# Patient Record
Sex: Male | Born: 1980 | Race: Black or African American | Hispanic: No | Marital: Single | Smoking: Current some day smoker
Health system: Southern US, Community
[De-identification: ages and names within clinical notes are randomized; demographics above are authoritative.]

## PROBLEM LIST (undated history)

## (undated) DIAGNOSIS — J45909 Unspecified asthma, uncomplicated: Secondary | ICD-10-CM

## (undated) DIAGNOSIS — R51 Headache: Secondary | ICD-10-CM

## (undated) DIAGNOSIS — H35039 Hypertensive retinopathy, unspecified eye: Secondary | ICD-10-CM

## (undated) DIAGNOSIS — H6120 Impacted cerumen, unspecified ear: Secondary | ICD-10-CM

## (undated) DIAGNOSIS — B3781 Candidal esophagitis: Secondary | ICD-10-CM

## (undated) DIAGNOSIS — H269 Unspecified cataract: Secondary | ICD-10-CM

## (undated) DIAGNOSIS — F419 Anxiety disorder, unspecified: Secondary | ICD-10-CM

## (undated) DIAGNOSIS — I82409 Acute embolism and thrombosis of unspecified deep veins of unspecified lower extremity: Secondary | ICD-10-CM

## (undated) DIAGNOSIS — H00019 Hordeolum externum unspecified eye, unspecified eyelid: Secondary | ICD-10-CM

## (undated) DIAGNOSIS — L989 Disorder of the skin and subcutaneous tissue, unspecified: Secondary | ICD-10-CM

## (undated) DIAGNOSIS — B20 Human immunodeficiency virus [HIV] disease: Secondary | ICD-10-CM

## (undated) DIAGNOSIS — H3320 Serous retinal detachment, unspecified eye: Secondary | ICD-10-CM

## (undated) DIAGNOSIS — R21 Rash and other nonspecific skin eruption: Secondary | ICD-10-CM

## (undated) DIAGNOSIS — K8309 Other cholangitis: Secondary | ICD-10-CM

## (undated) DIAGNOSIS — J189 Pneumonia, unspecified organism: Secondary | ICD-10-CM

## (undated) DIAGNOSIS — I10 Essential (primary) hypertension: Secondary | ICD-10-CM

## (undated) DIAGNOSIS — A5149 Other secondary syphilitic conditions: Secondary | ICD-10-CM

## (undated) DIAGNOSIS — Z21 Asymptomatic human immunodeficiency virus [HIV] infection status: Secondary | ICD-10-CM

## (undated) HISTORY — DX: Unspecified cataract: H26.9

## (undated) HISTORY — DX: Hypertensive retinopathy, unspecified eye: H35.039

## (undated) HISTORY — DX: Rash and other nonspecific skin eruption: R21

## (undated) HISTORY — DX: Impacted cerumen, unspecified ear: H61.20

## (undated) HISTORY — DX: Other secondary syphilitic conditions: A51.49

## (undated) HISTORY — DX: Serous retinal detachment, unspecified eye: H33.20

## (undated) MED FILL — Spironolactone Tab 100 MG: ORAL | Fill #0 | Status: CN

## (undated) MED FILL — Dolutegravir Sodium Tab 50 MG (Base Equiv): ORAL | Fill #0 | Status: CN

---
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
## 1999-08-06 ENCOUNTER — Emergency Department (HOSPITAL_COMMUNITY): Admission: EM | Admit: 1999-08-06 | Discharge: 1999-08-06 | Payer: Self-pay | Admitting: Emergency Medicine

## 2005-06-12 ENCOUNTER — Emergency Department (HOSPITAL_COMMUNITY): Admission: EM | Admit: 2005-06-12 | Discharge: 2005-06-13 | Payer: Self-pay | Admitting: Emergency Medicine

## 2005-06-14 ENCOUNTER — Emergency Department (HOSPITAL_COMMUNITY): Admission: EM | Admit: 2005-06-14 | Discharge: 2005-06-14 | Payer: Self-pay | Admitting: Emergency Medicine

## 2005-07-11 ENCOUNTER — Emergency Department (HOSPITAL_COMMUNITY): Admission: EM | Admit: 2005-07-11 | Discharge: 2005-07-11 | Payer: Self-pay | Admitting: Emergency Medicine

## 2005-09-11 ENCOUNTER — Emergency Department (HOSPITAL_COMMUNITY): Admission: EM | Admit: 2005-09-11 | Discharge: 2005-09-11 | Payer: Self-pay | Admitting: Emergency Medicine

## 2005-11-20 ENCOUNTER — Inpatient Hospital Stay (HOSPITAL_COMMUNITY): Admission: EM | Admit: 2005-11-20 | Discharge: 2005-11-24 | Payer: Self-pay | Admitting: Emergency Medicine

## 2005-11-21 ENCOUNTER — Ambulatory Visit: Payer: Self-pay | Admitting: Infectious Diseases

## 2005-12-06 ENCOUNTER — Ambulatory Visit: Payer: Self-pay | Admitting: Internal Medicine

## 2005-12-06 ENCOUNTER — Encounter: Admission: RE | Admit: 2005-12-06 | Discharge: 2005-12-06 | Payer: Self-pay | Admitting: Internal Medicine

## 2005-12-06 ENCOUNTER — Encounter (INDEPENDENT_AMBULATORY_CARE_PROVIDER_SITE_OTHER): Payer: Self-pay | Admitting: *Deleted

## 2005-12-06 LAB — CONVERTED CEMR LAB
CD4 Count: 9 microliters
CD4 T Cell Abs: 9

## 2005-12-20 ENCOUNTER — Ambulatory Visit: Payer: Self-pay | Admitting: Internal Medicine

## 2006-01-19 ENCOUNTER — Ambulatory Visit: Payer: Self-pay | Admitting: Internal Medicine

## 2006-02-03 ENCOUNTER — Ambulatory Visit: Payer: Self-pay | Admitting: Internal Medicine

## 2006-03-08 ENCOUNTER — Emergency Department (HOSPITAL_COMMUNITY): Admission: EM | Admit: 2006-03-08 | Discharge: 2006-03-09 | Payer: Self-pay | Admitting: Emergency Medicine

## 2006-03-21 ENCOUNTER — Emergency Department (HOSPITAL_COMMUNITY): Admission: EM | Admit: 2006-03-21 | Discharge: 2006-03-21 | Payer: Self-pay | Admitting: Family Medicine

## 2006-03-22 DIAGNOSIS — B59 Pneumocystosis: Secondary | ICD-10-CM | POA: Insufficient documentation

## 2006-03-22 DIAGNOSIS — J45909 Unspecified asthma, uncomplicated: Secondary | ICD-10-CM | POA: Insufficient documentation

## 2006-03-22 DIAGNOSIS — B20 Human immunodeficiency virus [HIV] disease: Secondary | ICD-10-CM | POA: Insufficient documentation

## 2006-03-29 ENCOUNTER — Encounter: Admission: RE | Admit: 2006-03-29 | Discharge: 2006-03-29 | Payer: Self-pay | Admitting: Internal Medicine

## 2006-03-29 ENCOUNTER — Encounter (INDEPENDENT_AMBULATORY_CARE_PROVIDER_SITE_OTHER): Payer: Self-pay | Admitting: *Deleted

## 2006-03-29 ENCOUNTER — Ambulatory Visit: Payer: Self-pay | Admitting: Internal Medicine

## 2006-03-29 ENCOUNTER — Inpatient Hospital Stay (HOSPITAL_COMMUNITY): Admission: AD | Admit: 2006-03-29 | Discharge: 2006-04-01 | Payer: Self-pay | Admitting: Internal Medicine

## 2006-03-29 LAB — CONVERTED CEMR LAB
CD4 Count: 80 microliters
HIV 1 RNA Quant: 49 copies/mL

## 2006-03-30 ENCOUNTER — Ambulatory Visit: Payer: Self-pay | Admitting: Infectious Diseases

## 2006-05-30 HISTORY — PX: INCISE AND DRAIN ABCESS: PRO64

## 2006-06-13 ENCOUNTER — Ambulatory Visit: Payer: Self-pay | Admitting: Internal Medicine

## 2006-06-27 ENCOUNTER — Ambulatory Visit: Payer: Self-pay | Admitting: Internal Medicine

## 2006-06-27 ENCOUNTER — Encounter (INDEPENDENT_AMBULATORY_CARE_PROVIDER_SITE_OTHER): Payer: Self-pay | Admitting: *Deleted

## 2006-06-27 ENCOUNTER — Encounter: Payer: Self-pay | Admitting: Infectious Diseases

## 2006-06-27 ENCOUNTER — Encounter: Admission: RE | Admit: 2006-06-27 | Discharge: 2006-06-27 | Payer: Self-pay | Admitting: Internal Medicine

## 2006-06-27 DIAGNOSIS — L0291 Cutaneous abscess, unspecified: Secondary | ICD-10-CM | POA: Insufficient documentation

## 2006-06-27 DIAGNOSIS — L0390 Cellulitis, unspecified: Secondary | ICD-10-CM

## 2006-06-27 LAB — CONVERTED CEMR LAB
ALT: 37 units/L (ref 0–53)
AST: 41 units/L — ABNORMAL HIGH (ref 0–37)
Albumin: 4 g/dL (ref 3.5–5.2)
BUN: 6 mg/dL (ref 6–23)
CO2: 25 meq/L (ref 19–32)
Calcium: 9.6 mg/dL (ref 8.4–10.5)
Chloride: 102 meq/L (ref 96–112)
Creatinine, Ser: 0.84 mg/dL (ref 0.40–1.50)
HIV 1 RNA Quant: <50 copies/mL (ref <50)
Hemoglobin: 13.9 g/dL (ref 13.0–17.0)
Lymphocytes Relative: 15 % (ref 12–46)
Monocytes Absolute: 0.6 10*3/uL (ref 0.2–0.7)
Monocytes Relative: 11 % (ref 3–11)
Neutro Abs: 3.5 10*3/uL (ref 1.7–7.7)
Neutrophils Relative %: 69 % (ref 43–77)
Potassium: 4.5 meq/L (ref 3.5–5.3)
RBC: 4.56 M/uL (ref 4.22–5.81)
WBC: 5.1 10*3/uL (ref 4.0–10.5)

## 2006-07-13 ENCOUNTER — Encounter: Payer: Self-pay | Admitting: Infectious Diseases

## 2006-07-24 ENCOUNTER — Encounter (INDEPENDENT_AMBULATORY_CARE_PROVIDER_SITE_OTHER): Payer: Self-pay | Admitting: *Deleted

## 2006-07-24 LAB — CONVERTED CEMR LAB

## 2006-08-06 ENCOUNTER — Encounter (INDEPENDENT_AMBULATORY_CARE_PROVIDER_SITE_OTHER): Payer: Self-pay | Admitting: *Deleted

## 2006-08-14 ENCOUNTER — Telehealth (INDEPENDENT_AMBULATORY_CARE_PROVIDER_SITE_OTHER): Payer: Self-pay | Admitting: Infectious Diseases

## 2006-09-03 ENCOUNTER — Emergency Department (HOSPITAL_COMMUNITY): Admission: EM | Admit: 2006-09-03 | Discharge: 2006-09-03 | Payer: Self-pay | Admitting: Emergency Medicine

## 2006-09-03 ENCOUNTER — Inpatient Hospital Stay (HOSPITAL_COMMUNITY): Admission: AD | Admit: 2006-09-03 | Discharge: 2006-09-11 | Payer: Self-pay | Admitting: Internal Medicine

## 2006-09-03 ENCOUNTER — Ambulatory Visit: Payer: Self-pay | Admitting: Internal Medicine

## 2006-09-04 ENCOUNTER — Ambulatory Visit: Payer: Self-pay | Admitting: Vascular Surgery

## 2006-09-04 ENCOUNTER — Encounter: Payer: Self-pay | Admitting: Vascular Surgery

## 2006-09-05 ENCOUNTER — Ambulatory Visit: Payer: Self-pay | Admitting: Internal Medicine

## 2006-09-11 ENCOUNTER — Encounter: Payer: Self-pay | Admitting: Internal Medicine

## 2006-09-24 ENCOUNTER — Emergency Department (HOSPITAL_COMMUNITY): Admission: EM | Admit: 2006-09-24 | Discharge: 2006-09-25 | Payer: Self-pay | Admitting: Emergency Medicine

## 2006-10-30 ENCOUNTER — Telehealth: Payer: Self-pay

## 2006-11-13 ENCOUNTER — Ambulatory Visit: Payer: Self-pay | Admitting: Internal Medicine

## 2006-11-13 ENCOUNTER — Telehealth: Payer: Self-pay | Admitting: Internal Medicine

## 2006-11-13 ENCOUNTER — Encounter: Admission: RE | Admit: 2006-11-13 | Discharge: 2006-11-13 | Payer: Self-pay | Admitting: Internal Medicine

## 2006-11-13 LAB — CONVERTED CEMR LAB
ALT: 33 units/L (ref 0–53)
AST: 32 units/L (ref 0–37)
Albumin: 4.2 g/dL (ref 3.5–5.2)
Alkaline Phosphatase: 170 units/L — ABNORMAL HIGH (ref 39–117)
Basophils Absolute: 0 10*3/uL (ref 0.0–0.1)
Eosinophils Absolute: 0.3 10*3/uL (ref 0.0–0.7)
Eosinophils Relative: 7 % — ABNORMAL HIGH (ref 0–5)
HCT: 46.2 % (ref 39.0–52.0)
HIV 1 RNA Quant: <50 copies/mL (ref <50)
Lymphs Abs: 1.1 10*3/uL (ref 0.7–3.3)
MCV: 96.5 fL (ref 78.0–100.0)
Neutrophils Relative %: 44 % (ref 43–77)
Platelets: 382 10*3/uL (ref 150–400)
Potassium: 4.4 meq/L (ref 3.5–5.3)
RDW: 12.5 % (ref 11.5–14.0)
Sodium: 140 meq/L (ref 135–145)
Total Bilirubin: 0.3 mg/dL (ref 0.3–1.2)
Total Protein: 8.6 g/dL — ABNORMAL HIGH (ref 6.0–8.3)
WBC: 3.4 10*3/uL — ABNORMAL LOW (ref 4.0–10.5)

## 2006-11-15 ENCOUNTER — Encounter: Payer: Self-pay | Admitting: Internal Medicine

## 2006-12-05 ENCOUNTER — Inpatient Hospital Stay (HOSPITAL_COMMUNITY): Admission: EM | Admit: 2006-12-05 | Discharge: 2006-12-08 | Payer: Self-pay | Admitting: Emergency Medicine

## 2006-12-05 ENCOUNTER — Ambulatory Visit: Payer: Self-pay | Admitting: Internal Medicine

## 2006-12-13 ENCOUNTER — Telehealth: Payer: Self-pay | Admitting: Internal Medicine

## 2007-01-01 ENCOUNTER — Ambulatory Visit (HOSPITAL_COMMUNITY): Admission: RE | Admit: 2007-01-01 | Discharge: 2007-01-01 | Payer: Self-pay | Admitting: Infectious Diseases

## 2007-01-01 ENCOUNTER — Encounter: Payer: Self-pay | Admitting: Internal Medicine

## 2007-06-25 ENCOUNTER — Telehealth: Payer: Self-pay | Admitting: Internal Medicine

## 2007-06-27 ENCOUNTER — Encounter: Admission: RE | Admit: 2007-06-27 | Discharge: 2007-06-27 | Payer: Self-pay | Admitting: Internal Medicine

## 2007-06-27 ENCOUNTER — Ambulatory Visit: Payer: Self-pay | Admitting: Internal Medicine

## 2007-06-27 LAB — CONVERTED CEMR LAB
Albumin: 3.8 g/dL (ref 3.5–5.2)
Alkaline Phosphatase: 101 units/L (ref 39–117)
BUN: 7 mg/dL (ref 6–23)
Calcium: 8.8 mg/dL (ref 8.4–10.5)
Creatinine, Ser: 0.84 mg/dL (ref 0.40–1.50)
Eosinophils Absolute: 0.1 10*3/uL (ref 0.0–0.7)
Glucose, Bld: 78 mg/dL (ref 70–99)
HCT: 46.3 % (ref 39.0–52.0)
HIV 1 RNA Quant: 99000 copies/mL — ABNORMAL HIGH (ref <50)
HIV-1 RNA Quant, Log: 5 — ABNORMAL HIGH (ref <1.70)
Hemoglobin: 15.1 g/dL (ref 13.0–17.0)
Lymphs Abs: 0.5 10*3/uL — ABNORMAL LOW (ref 0.7–4.0)
MCHC: 32.6 g/dL (ref 30.0–36.0)
MCV: 91 fL (ref 78.0–100.0)
Monocytes Absolute: 0.4 10*3/uL (ref 0.1–1.0)
Monocytes Relative: 17 % — ABNORMAL HIGH (ref 3–12)
Neutrophils Relative %: 60 % (ref 43–77)
Potassium: 3.8 meq/L (ref 3.5–5.3)
RBC: 5.09 M/uL (ref 4.22–5.81)
WBC: 2.4 10*3/uL — ABNORMAL LOW (ref 4.0–10.5)

## 2007-08-14 ENCOUNTER — Encounter: Payer: Self-pay | Admitting: Internal Medicine

## 2007-09-18 ENCOUNTER — Ambulatory Visit: Payer: Self-pay | Admitting: Internal Medicine

## 2007-11-12 ENCOUNTER — Telehealth: Payer: Self-pay | Admitting: Internal Medicine

## 2007-11-12 ENCOUNTER — Telehealth (INDEPENDENT_AMBULATORY_CARE_PROVIDER_SITE_OTHER): Payer: Self-pay | Admitting: *Deleted

## 2008-03-26 ENCOUNTER — Ambulatory Visit: Payer: Self-pay | Admitting: Internal Medicine

## 2008-03-26 LAB — CONVERTED CEMR LAB
ALT: 14 units/L (ref 0–53)
AST: 17 units/L (ref 0–37)
Albumin: 3.9 g/dL (ref 3.5–5.2)
Basophils Absolute: 0 10*3/uL (ref 0.0–0.1)
Basophils Relative: 1 % (ref 0–1)
Calcium: 9.4 mg/dL (ref 8.4–10.5)
Chloride: 110 meq/L (ref 96–112)
MCHC: 31.6 g/dL (ref 30.0–36.0)
Neutro Abs: 1.5 10*3/uL — ABNORMAL LOW (ref 1.7–7.7)
Neutrophils Relative %: 51 % (ref 43–77)
Potassium: 3.9 meq/L (ref 3.5–5.3)
RBC: 4.58 M/uL (ref 4.22–5.81)
RDW: 13.9 % (ref 11.5–15.5)
Total Protein: 7.5 g/dL (ref 6.0–8.3)

## 2008-04-09 ENCOUNTER — Ambulatory Visit: Payer: Self-pay | Admitting: Internal Medicine

## 2008-04-09 DIAGNOSIS — G609 Hereditary and idiopathic neuropathy, unspecified: Secondary | ICD-10-CM | POA: Insufficient documentation

## 2008-06-11 ENCOUNTER — Ambulatory Visit: Payer: Self-pay | Admitting: Internal Medicine

## 2008-06-11 LAB — CONVERTED CEMR LAB
AST: 27 units/L (ref 0–37)
Albumin: 4 g/dL (ref 3.5–5.2)
Alkaline Phosphatase: 74 units/L (ref 39–117)
BUN: 9 mg/dL (ref 6–23)
Basophils Relative: 1 % (ref 0–1)
Eosinophils Absolute: 0 10*3/uL (ref 0.0–0.7)
MCHC: 31.9 g/dL (ref 30.0–36.0)
MCV: 95.2 fL (ref 78.0–100.0)
Monocytes Relative: 23 % — ABNORMAL HIGH (ref 3–12)
Neutrophils Relative %: 56 % (ref 43–77)
Platelets: 297 10*3/uL (ref 150–400)
Potassium: 3.9 meq/L (ref 3.5–5.3)
RBC: 5.04 M/uL (ref 4.22–5.81)
RDW: 11.5 % (ref 11.5–15.5)

## 2008-07-05 ENCOUNTER — Emergency Department (HOSPITAL_COMMUNITY): Admission: EM | Admit: 2008-07-05 | Discharge: 2008-07-05 | Payer: Self-pay | Admitting: Emergency Medicine

## 2008-07-09 ENCOUNTER — Ambulatory Visit: Payer: Self-pay | Admitting: Internal Medicine

## 2008-09-03 ENCOUNTER — Telehealth (INDEPENDENT_AMBULATORY_CARE_PROVIDER_SITE_OTHER): Payer: Self-pay | Admitting: *Deleted

## 2008-09-17 ENCOUNTER — Ambulatory Visit: Payer: Self-pay | Admitting: Internal Medicine

## 2008-09-17 ENCOUNTER — Ambulatory Visit (HOSPITAL_COMMUNITY): Admission: RE | Admit: 2008-09-17 | Discharge: 2008-09-17 | Payer: Self-pay | Admitting: Internal Medicine

## 2008-09-17 DIAGNOSIS — K029 Dental caries, unspecified: Secondary | ICD-10-CM | POA: Insufficient documentation

## 2008-09-17 DIAGNOSIS — R0789 Other chest pain: Secondary | ICD-10-CM | POA: Insufficient documentation

## 2008-11-10 ENCOUNTER — Encounter: Payer: Self-pay | Admitting: Internal Medicine

## 2008-12-02 ENCOUNTER — Telehealth: Payer: Self-pay

## 2009-02-26 ENCOUNTER — Telehealth (INDEPENDENT_AMBULATORY_CARE_PROVIDER_SITE_OTHER): Payer: Self-pay | Admitting: *Deleted

## 2009-02-26 ENCOUNTER — Encounter: Payer: Self-pay | Admitting: Internal Medicine

## 2009-02-26 ENCOUNTER — Emergency Department (HOSPITAL_COMMUNITY): Admission: EM | Admit: 2009-02-26 | Discharge: 2009-02-26 | Payer: Self-pay | Admitting: Emergency Medicine

## 2009-03-30 HISTORY — PX: AMPUTATION FINGER / THUMB: SUR24

## 2009-03-30 HISTORY — PX: INCISION AND DRAINAGE OF WOUND: SHX1803

## 2009-04-05 ENCOUNTER — Inpatient Hospital Stay (HOSPITAL_COMMUNITY): Admission: EM | Admit: 2009-04-05 | Discharge: 2009-04-09 | Payer: Self-pay | Admitting: Emergency Medicine

## 2009-04-06 ENCOUNTER — Ambulatory Visit: Payer: Self-pay | Admitting: Infectious Diseases

## 2009-04-06 ENCOUNTER — Encounter (INDEPENDENT_AMBULATORY_CARE_PROVIDER_SITE_OTHER): Payer: Self-pay

## 2009-04-09 ENCOUNTER — Ambulatory Visit: Payer: Self-pay | Admitting: Infectious Diseases

## 2009-04-13 ENCOUNTER — Encounter: Payer: Self-pay | Admitting: Internal Medicine

## 2009-04-20 ENCOUNTER — Encounter: Payer: Self-pay | Admitting: Internal Medicine

## 2009-05-01 ENCOUNTER — Encounter: Payer: Self-pay | Admitting: Internal Medicine

## 2009-05-05 ENCOUNTER — Telehealth (INDEPENDENT_AMBULATORY_CARE_PROVIDER_SITE_OTHER): Payer: Self-pay | Admitting: *Deleted

## 2009-05-07 ENCOUNTER — Telehealth: Payer: Self-pay | Admitting: Infectious Diseases

## 2009-05-07 ENCOUNTER — Encounter: Payer: Self-pay | Admitting: Infectious Diseases

## 2009-05-13 ENCOUNTER — Encounter: Payer: Self-pay | Admitting: Internal Medicine

## 2009-06-04 ENCOUNTER — Telehealth (INDEPENDENT_AMBULATORY_CARE_PROVIDER_SITE_OTHER): Payer: Self-pay | Admitting: *Deleted

## 2009-06-04 ENCOUNTER — Encounter: Payer: Self-pay | Admitting: Infectious Diseases

## 2009-06-11 ENCOUNTER — Telehealth (INDEPENDENT_AMBULATORY_CARE_PROVIDER_SITE_OTHER): Payer: Self-pay | Admitting: *Deleted

## 2009-11-12 ENCOUNTER — Ambulatory Visit: Payer: Self-pay | Admitting: Infectious Diseases

## 2009-11-12 ENCOUNTER — Encounter: Payer: Self-pay | Admitting: Internal Medicine

## 2009-11-12 ENCOUNTER — Inpatient Hospital Stay (HOSPITAL_COMMUNITY): Admission: EM | Admit: 2009-11-12 | Discharge: 2009-11-16 | Payer: Self-pay | Admitting: Emergency Medicine

## 2009-11-16 ENCOUNTER — Encounter: Payer: Self-pay | Admitting: Internal Medicine

## 2009-11-16 DIAGNOSIS — B009 Herpesviral infection, unspecified: Secondary | ICD-10-CM | POA: Insufficient documentation

## 2009-11-18 ENCOUNTER — Encounter: Payer: Self-pay | Admitting: Internal Medicine

## 2009-11-19 ENCOUNTER — Telehealth: Payer: Self-pay | Admitting: Internal Medicine

## 2010-02-09 ENCOUNTER — Telehealth (INDEPENDENT_AMBULATORY_CARE_PROVIDER_SITE_OTHER): Payer: Self-pay | Admitting: *Deleted

## 2010-02-17 ENCOUNTER — Encounter (INDEPENDENT_AMBULATORY_CARE_PROVIDER_SITE_OTHER): Payer: Self-pay | Admitting: *Deleted

## 2010-03-05 ENCOUNTER — Encounter (INDEPENDENT_AMBULATORY_CARE_PROVIDER_SITE_OTHER): Payer: Self-pay | Admitting: *Deleted

## 2010-03-08 ENCOUNTER — Encounter: Payer: Self-pay | Admitting: Adult Health

## 2010-03-08 ENCOUNTER — Ambulatory Visit: Payer: Self-pay | Admitting: Internal Medicine

## 2010-03-08 ENCOUNTER — Encounter (INDEPENDENT_AMBULATORY_CARE_PROVIDER_SITE_OTHER): Payer: Self-pay | Admitting: *Deleted

## 2010-03-08 DIAGNOSIS — R05 Cough: Secondary | ICD-10-CM

## 2010-03-08 DIAGNOSIS — R059 Cough, unspecified: Secondary | ICD-10-CM | POA: Insufficient documentation

## 2010-03-08 DIAGNOSIS — B3781 Candidal esophagitis: Secondary | ICD-10-CM | POA: Insufficient documentation

## 2010-03-08 DIAGNOSIS — R61 Generalized hyperhidrosis: Secondary | ICD-10-CM | POA: Insufficient documentation

## 2010-03-08 DIAGNOSIS — H538 Other visual disturbances: Secondary | ICD-10-CM | POA: Insufficient documentation

## 2010-03-08 DIAGNOSIS — R64 Cachexia: Secondary | ICD-10-CM | POA: Insufficient documentation

## 2010-03-08 LAB — CONVERTED CEMR LAB
Alkaline Phosphatase: 73 units/L (ref 39–117)
BUN: 7 mg/dL (ref 6–23)
Creatinine, Ser: 1.12 mg/dL (ref 0.40–1.50)
Eosinophils Absolute: 0 10*3/uL (ref 0.0–0.7)
Eosinophils Relative: 0 % (ref 0–5)
Glucose, Bld: 81 mg/dL (ref 70–99)
HCT: 41 % (ref 39.0–52.0)
HDL: 28 mg/dL — ABNORMAL LOW (ref >39)
LDL Cholesterol: 22 mg/dL (ref 0–99)
Lymphs Abs: 0.8 10*3/uL (ref 0.7–4.0)
MCV: 94.9 fL (ref 78.0–100.0)
Monocytes Absolute: 0.5 10*3/uL (ref 0.1–1.0)
Monocytes Relative: 11 % (ref 3–12)
Platelets: 224 10*3/uL (ref 150–400)
RBC: 4.32 M/uL (ref 4.22–5.81)
Total Bilirubin: 0.7 mg/dL (ref 0.3–1.2)
Triglycerides: 75 mg/dL (ref <150)
VLDL: 15 mg/dL (ref 0–40)
WBC: 4.8 10*3/uL (ref 4.0–10.5)

## 2010-03-10 ENCOUNTER — Encounter (INDEPENDENT_AMBULATORY_CARE_PROVIDER_SITE_OTHER): Payer: Self-pay | Admitting: *Deleted

## 2010-03-12 ENCOUNTER — Encounter (INDEPENDENT_AMBULATORY_CARE_PROVIDER_SITE_OTHER): Payer: Self-pay | Admitting: *Deleted

## 2010-03-18 ENCOUNTER — Ambulatory Visit: Payer: Self-pay | Admitting: Adult Health

## 2010-03-18 ENCOUNTER — Encounter (INDEPENDENT_AMBULATORY_CARE_PROVIDER_SITE_OTHER): Payer: Self-pay | Admitting: *Deleted

## 2010-03-18 DIAGNOSIS — E876 Hypokalemia: Secondary | ICD-10-CM | POA: Insufficient documentation

## 2010-03-26 ENCOUNTER — Encounter (INDEPENDENT_AMBULATORY_CARE_PROVIDER_SITE_OTHER): Payer: Self-pay | Admitting: *Deleted

## 2010-04-16 ENCOUNTER — Encounter (INDEPENDENT_AMBULATORY_CARE_PROVIDER_SITE_OTHER): Payer: Self-pay | Admitting: *Deleted

## 2010-04-19 ENCOUNTER — Ambulatory Visit: Payer: Self-pay | Admitting: Adult Health

## 2010-04-19 ENCOUNTER — Encounter (INDEPENDENT_AMBULATORY_CARE_PROVIDER_SITE_OTHER): Payer: Self-pay | Admitting: *Deleted

## 2010-04-19 LAB — CONVERTED CEMR LAB
Albumin: 4.2 g/dL (ref 3.5–5.2)
Alkaline Phosphatase: 85 units/L (ref 39–117)
BUN: 12 mg/dL (ref 6–23)
Basophils Relative: 0 % (ref 0–1)
CO2: 22 meq/L (ref 19–32)
Glucose, Bld: 97 mg/dL (ref 70–99)
HIV-1 RNA Quant, Log: 2.94 — ABNORMAL HIGH (ref <1.30)
Hemoglobin: 14.5 g/dL (ref 13.0–17.0)
Lymphs Abs: 0.7 10*3/uL (ref 0.7–4.0)
MCHC: 32.8 g/dL (ref 30.0–36.0)
Monocytes Absolute: 0.5 10*3/uL (ref 0.1–1.0)
Monocytes Relative: 16 % — ABNORMAL HIGH (ref 3–12)
Neutro Abs: 1.9 10*3/uL (ref 1.7–7.7)
RBC: 4.67 M/uL (ref 4.22–5.81)
Total Bilirubin: 0.3 mg/dL (ref 0.3–1.2)
WBC: 3.1 10*3/uL — ABNORMAL LOW (ref 4.0–10.5)

## 2010-05-06 ENCOUNTER — Ambulatory Visit: Payer: Self-pay | Admitting: Adult Health

## 2010-05-06 ENCOUNTER — Encounter (INDEPENDENT_AMBULATORY_CARE_PROVIDER_SITE_OTHER): Payer: Self-pay | Admitting: *Deleted

## 2010-05-10 ENCOUNTER — Encounter: Payer: Self-pay | Admitting: Adult Health

## 2010-05-10 ENCOUNTER — Encounter (INDEPENDENT_AMBULATORY_CARE_PROVIDER_SITE_OTHER): Payer: Self-pay | Admitting: *Deleted

## 2010-05-10 DIAGNOSIS — G4700 Insomnia, unspecified: Secondary | ICD-10-CM | POA: Insufficient documentation

## 2010-05-10 DIAGNOSIS — K056 Periodontal disease, unspecified: Secondary | ICD-10-CM | POA: Insufficient documentation

## 2010-05-10 DIAGNOSIS — K069 Disorder of gingiva and edentulous alveolar ridge, unspecified: Secondary | ICD-10-CM

## 2010-05-27 ENCOUNTER — Emergency Department (HOSPITAL_COMMUNITY)
Admission: EM | Admit: 2010-05-27 | Discharge: 2010-05-27 | Payer: Self-pay | Source: Home / Self Care | Admitting: Emergency Medicine

## 2010-05-31 ENCOUNTER — Emergency Department (HOSPITAL_COMMUNITY)
Admission: EM | Admit: 2010-05-31 | Discharge: 2010-05-31 | Payer: Self-pay | Source: Home / Self Care | Admitting: Emergency Medicine

## 2010-06-08 ENCOUNTER — Encounter (INDEPENDENT_AMBULATORY_CARE_PROVIDER_SITE_OTHER): Payer: Self-pay | Admitting: *Deleted

## 2010-06-10 ENCOUNTER — Encounter: Payer: Self-pay | Admitting: Adult Health

## 2010-06-10 ENCOUNTER — Telehealth: Payer: Self-pay | Admitting: Adult Health

## 2010-06-10 ENCOUNTER — Ambulatory Visit
Admission: RE | Admit: 2010-06-10 | Discharge: 2010-06-10 | Payer: Self-pay | Source: Home / Self Care | Attending: Adult Health | Admitting: Adult Health

## 2010-06-10 ENCOUNTER — Encounter (INDEPENDENT_AMBULATORY_CARE_PROVIDER_SITE_OTHER): Payer: Self-pay | Admitting: *Deleted

## 2010-06-10 LAB — CONVERTED CEMR LAB
Albumin: 4 g/dL (ref 3.5–5.2)
Alkaline Phosphatase: 106 units/L (ref 39–117)
BUN: 8 mg/dL (ref 6–23)
CO2: 24 meq/L (ref 19–32)
Calcium: 9.3 mg/dL (ref 8.4–10.5)
Glucose, Bld: 81 mg/dL (ref 70–99)
Hemoglobin: 15.7 g/dL (ref 13.0–17.0)
Lymphocytes Relative: 46 % (ref 12–46)
Lymphs Abs: 1.6 10*3/uL (ref 0.7–4.0)
MCHC: 33.1 g/dL (ref 30.0–36.0)
Monocytes Absolute: 0.4 10*3/uL (ref 0.1–1.0)
Monocytes Relative: 11 % (ref 3–12)
Neutro Abs: 1.4 10*3/uL — ABNORMAL LOW (ref 1.7–7.7)
Potassium: 4.1 meq/L (ref 3.5–5.3)
RBC: 4.93 M/uL (ref 4.22–5.81)
WBC: 3.6 10*3/uL — ABNORMAL LOW (ref 4.0–10.5)

## 2010-06-14 LAB — T-HELPER CELL (CD4) - (RCID CLINIC ONLY)
CD4 % Helper T Cell: 2 % — ABNORMAL LOW (ref 33–55)
CD4 T Cell Abs: 20 uL — ABNORMAL LOW (ref 400–2700)

## 2010-06-20 ENCOUNTER — Encounter: Payer: Self-pay | Admitting: Infectious Diseases

## 2010-06-20 ENCOUNTER — Encounter: Payer: Self-pay | Admitting: *Deleted

## 2010-06-24 ENCOUNTER — Encounter (INDEPENDENT_AMBULATORY_CARE_PROVIDER_SITE_OTHER): Payer: Self-pay | Admitting: *Deleted

## 2010-06-24 ENCOUNTER — Ambulatory Visit
Admission: RE | Admit: 2010-06-24 | Discharge: 2010-06-24 | Payer: Self-pay | Source: Home / Self Care | Attending: Adult Health | Admitting: Adult Health

## 2010-06-24 DIAGNOSIS — B081 Molluscum contagiosum: Secondary | ICD-10-CM | POA: Insufficient documentation

## 2010-06-28 ENCOUNTER — Encounter (INDEPENDENT_AMBULATORY_CARE_PROVIDER_SITE_OTHER): Payer: Self-pay | Admitting: *Deleted

## 2010-06-29 NOTE — Discharge Summary (Signed)
Summary: Hospital Discharge Update (thrush, CMV)    Hospital Discharge Update:  Date of Admission: 11/12/2009 Date of Discharge: 11/16/2009  Brief Summary:  abd pain- scans and test negaitve              - candidal esopahgitis + ? 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) - Signed  Medication list changes:  Added new medication of FLUCONAZOLE 150 MG TABS (FLUCONAZOLE) Take 1 tablet by mouth once a day for 15 days - Signed Added new medication of GANCICLOVIR 500 MG CAPS (GANCICLOVIR) 2 tablets three times a day for 15 days - Signed Added new medication of NYSTATIN 100000 UNIT/ML SUSP (NYSTATIN) swallow 5 ml three times a day for 15 days - Signed Added new medication of TRAMADOL HCL 50 MG TABS (TRAMADOL HCL) every 4 hrs as needed for pain - Signed Rx of FLUCONAZOLE 150 MG TABS (FLUCONAZOLE) Take 1 tablet by mouth once a day for 15 days;  #15 x 0;  Signed;  Entered by: Bethel Born MD;  Authorized by: Bethel Born MD;  Method used: [facility] [phone]*, [address] [place], [place]  [address], Ph: [phone] or [phone], Fax: [phone] Rx of GANCICLOVIR 500 MG CAPS (GANCICLOVIR) 2 tablets three times a day for 15 days;  #90 x 0;  Signed;  Entered by: Bethel Born MD;  Authorized by: Bethel Born MD;  Method used: [facility]. [phone]*, [address], [place], [place]  [address], Ph: [phone] or [phone], Fax: [phone] Rx of NYSTATIN 100000 UNIT/ML SUSP (NYSTATIN) swallow 5 ml three times a day for 15 days;  #1 x 1;  Signed;  Entered by:  Bethel Born MD;  Authorized by: Bethel Born MD;  Method used: [facility] to [facility]. [phone]*, [address] [place], [place]  [address], Ph: [phone] or [phone], Fax: [phone] Rx of TRAMADOL HCL 50 MG TABS (TRAMADOL HCL) every 4 hrs as needed for pain;  #120 x 0;  Signed;  Entered by: Bethel Born MD;  Authorized by: Bethel Born MD;  Method used: [facility] to [facility] [phone]*, [address], [place], [place]  [address], Ph: [phone] or [phone], Fax: [phone] Rx of FLUCONAZOLE 150 MG TABS (FLUCONAZOLE) Take 1 tablet by mouth once a day for 15 days;  #15 x 0;  Signed;  Entered by: Bethel Born MD;  Authorized by: Bethel Born MD;  Method used: Electronically to [facility] [address] [address], [place], [place]  [id number], Ph: [phone], Fax: [phone] Rx of GANCICLOVIR 500 MG CAPS (GANCICLOVIR) 2 tablets three times a day for 15 days;  #90 x 0;  Signed;  Entered by: Bethel Born MD;  Authorized by: Bethel Born MD;  Method used: Electronically to [facility] [facility] [address], [place], [place]  [id number], Ph: [phone], Fax: [phone] Rx of NYSTATIN 100000 UNIT/ML SUSP (NYSTATIN) swallow 5 ml three times a day for 15 days;  #1 x 1;  Signed;  Entered by: Bethel Born MD;  Authorized by: Bethel Born MD;  Method used:  Electronically to [facility] [address], [place], [place]  [id number], Ph: [phone], Fax: [phone] Rx of TRAMADOL HCL 50 MG TABS (TRAMADOL HCL) every 4 hrs as needed for pain;  #120 x 0;  Signed;  Entered by: Bethel Born MD;  Authorized by: Bethel Born MD;  Method used: Electronically to [facility] [address], [place], [place]  [address], Ph: [phone], Fax: [phone]  The medication, problem, and allergy lists have been updated.  Please see the dictated discharge summary for details.  Discharge medications:  BACTRIM  DS 800-160 MG TABS (SULFAMETHOXAZOLE-TRIMETHOPRIM) one tab by mouth once daily AZITHROMYCIN 600 MG TABS (AZITHROMYCIN) take 2 tablets once a week TRUVADA 200-300 MG TABS (EMTRICITABINE-TENOFOVIR) Take 1 tablet by mouth once a day PREZISTA 400 MG TABS (DARUNAVIR ETHANOLATE) Take 2 tablets by mouth once a day * NORVIR 100 MG TABS (RITONAVIR) Take 1 tablet by mouth once a day FLUCONAZOLE 150 MG TABS (FLUCONAZOLE) Take 1 tablet by mouth once a day for 15 days GANCICLOVIR 500 MG CAPS (GANCICLOVIR) 2 tablets three times a day for 15 days NYSTATIN 100000 UNIT/ML SUSP (NYSTATIN) swallow 5 ml three times a day for 15 days TRAMADOL HCL 50 MG TABS (TRAMADOL HCL) every 4 hrs as needed for pain  Other patient instructions:  Follow up with Dr. Drue Second in 1-2 week    Note: Hospital Discharge Medications & Other Instructions handout was printed, one copy for patient and a second copy to be placed in hospital chart.  Prescriptions: TRAMADOL HCL 50 MG TABS (TRAMADOL HCL) every 4 hrs as needed for pain  #120 x 0   Entered and Authorized by:   Bethel Born MD   Signed by:   Bethel Born MD on 11/16/2009   Method used:   Electronically to        [facility] (retail)       [address]       [place], [place]  [address]       Ph: [phone]       Fax: [phone]   RxID:   [id number] NYSTATIN 100000 UNIT/ML SUSP (NYSTATIN) swallow 5 ml three times a day for 15 days  #1 x 1   Entered and Authorized by:   Bethel Born MD   Signed by:   Bethel Born MD on 11/16/2009   Method used:   Electronically to        [facility] [address] (retail)       [address]       [place], [place]  [address]       Ph: [phone]       Fax: [phone]   RxID:   [id number] GANCICLOVIR 500 MG CAPS (GANCICLOVIR) 2 tablets three times a day for 15 days  #90 x 0   Entered and Authorized by:   Bethel Born MD   Signed by:   Bethel Born MD on 11/16/2009   Method  used:   Electronically to        [facility] (retail)       [address]       [place], [place]  [address]       Ph: [phone]       Fax: [phone]   RxID:   [id number] FLUCONAZOLE 150 MG TABS (FLUCONAZOLE) Take 1 tablet by mouth once a day for 15 days  #15 x 0   Entered and Authorized  by:   Bethel Born MD   Signed by:   Bethel Born MD on 11/16/2009   Method used:   Electronically to        [facility] (retail)       [address]       [place], [place]  [address]       Ph: [phone]       Fax: [phone]   RxID:   [id number] TRAMADOL HCL 50 MG TABS (TRAMADOL HCL) every 4 hrs as needed for pain  #120 x 0   Entered and Authorized by:   Bethel Born MD   Signed by:   Bethel Born MD on 11/16/2009   Method used:   [facility]        [facility] [phone]* (retail)       [address], [place]  [address]       Ph: [phone] or [phone]       Fax: [phone]   RxID:   [id number] NYSTATIN 100000 UNIT/ML SUSP (NYSTATIN) swallow 5 ml three times a day for 15 days  #1 x 1   Entered and Authorized by:   Bethel Born MD   Signed by:   Bethel Born MD on 11/16/2009   Method used:   [facility]        [facility] [phone]* (retail)       [address], [place]  [address]       Ph: [phone] or [phone]       Fax: [phone]   RxID:   [id number] GANCICLOVIR 500 MG CAPS (GANCICLOVIR) 2 tablets three times a day for 15 days  #90 x 0   Entered and Authorized by:   Bethel Born MD   Signed by:   Bethel Born MD on 11/16/2009   Method used:   [facility]        [facility] [phone]* (retail)       [address], [place]  [address]       Ph: [phone] or [phone]       Fax: [phone]   RxID:   [id number] FLUCONAZOLE 150 MG TABS (FLUCONAZOLE) Take 1 tablet by mouth once a day for 15 days  #15 x 0   Entered and  Authorized by:   Bethel Born MD   Signed by:   Bethel Born MD on 11/16/2009   Method used:   [facility] to        [facility]. [phone]* (retail)       [address]       [place], [place]  [address]       Ph: [phone] or [phone]       Fax: [phone]   RxID:   [id number]  please ignore duplicate prescriptions.

## 2010-06-29 NOTE — Progress Notes (Signed)
Summary: [facility] ID clinic  Phone Note Other Incoming   Caller: Clavin at [facility] Summary of Call: Received a call from Clavin at [facility] pt. has missed several appts. and wanted the name and number for one of the [employer]. Called back at [phone] and left name and number for [facility]. Initial call taken by: Wendall Mola CMA [facility]),  February 09, 2010 11:08 AM

## 2010-06-29 NOTE — Miscellaneous (Signed)
Summary: Bridge Counselor  Clinical Lists Changes BC rescheuled pt's eye appointment today for December 9th. Pt did not have her medicaid card and was unable to be seen. BC will coordinate pt getting another copy of her medicaid card prior to her next appointment.

## 2010-06-29 NOTE — Miscellaneous (Signed)
Summary: Bridge Counselor  Clinical Lists Changes BC transported pt to [facility]. They would not see her today because she did not have her ID. She was rescheduled for 03/26/10 at 1:15pm. [facility] will provide transportation. BC also transported pt to [facility] for her f/u visit with Traci Sermon. Pt's thrush is gone and she reports that she is eating better. Pt's HIV medications were called into [facility]. Once pt receives her medications she will call BC. BC will make a home visit to assist pt with Tx adherence and filling up her pillbox.

## 2010-06-29 NOTE — Miscellaneous (Signed)
Summary: Bridge Counselor  Clinical Lists Changes BC transported pt to [facility] today for her scheuled appointment. Pt was told that there was nothing wrong with her eyes and that her eye sight will get worse with age. (pt report) BC rescheuled pt's [facility] appointment for 05/10/10 at 2:45pm due to being at the eye doctor for so long. Pt also now has a new Medicaid card and valid ID.

## 2010-06-29 NOTE — Miscellaneous (Signed)
Summary: Bridge Counselor  Clinical Lists Changes BC made a home visit this date to provide treatment adherence. BC took pt a chart showing pictures of each medication, when to take them and how to take them. BC also gave pt a checklist to mark each day that she takes her medications. BC also gave pt a journal to doument questions and side effects. Pt will return on 11/21 for repeat labs. <no value>

## 2010-06-29 NOTE — Assessment & Plan Note (Signed)
Summary: 10DAY F/U/VS   Visit Type:  Follow-up Primary Provider:  Talmadge Chad NP  CC:  f/u .  History of Present Illness: Presents for f/u following treatment of oral candidiasis.  Ophtho appt. rescheduled due to no ID on person.  No changes in vision reported.  Dysphagia and odynohagia abated.  Appetite improving.  No further chills, but remains with sweats.  Did not p/u Ensure as prescribed.  Still having issues with depression.  No suicidal thoughts or plans.   Preventive Screening-Counseling & Management  Alcohol-Tobacco     Alcohol drinks/day: 4 week     Alcohol type: beer     Smoking Status: current     Packs/Day: 0.5     Cigars/week: 2   Current Allergies (reviewed today): ! * PNEUMOVAX Review of Systems General:  Complains of fatigue, malaise, sweats, weakness, and weight loss; denies chills, fever, loss of appetite, and sleep disorder. Eyes:  Denies discharge, double vision, eye irritation, eye pain, halos, itching, light sensitivity, red eye, vision loss-1 eye, and vision loss-both eyes; Blurring unchanged no worse, no better. ENT:  Denies difficulty swallowing, hoarseness, and sore throat. Resp:  Denies chest discomfort, chest pain with inspiration, cough, coughing up blood, excessive snoring, hypersomnolence, morning headaches, pleuritic, shortness of breath, sputum productive, and wheezing. GI:  Denies abdominal pain, bloody stools, change in bowel habits, constipation, dark tarry stools, diarrhea, excessive appetite, gas, hemorrhoids, indigestion, loss of appetite, nausea, vomiting, vomiting blood, and yellowish skin color. Psych:  Complains of depression; denies alternate hallucination ( auditory/visual), anxiety, easily angered, easily tearful, irritability, mental problems, panic attacks, sense of great danger, suicidal thoughts/plans, thoughts of violence, unusual visions or sounds, and thoughts /plans of harming others.  Vital Signs:  Patient profile:   30  year old male Height:      71 inches (180.34 cm) Weight:      126.75 pounds (57.61 kg) BMI:     17.74 Temp:     97.4 degrees F (36.33 degrees C) oral Pulse rate:   74 / minute BP sitting:   121 / 81  (left arm)  Vitals Entered By: Starleen Arms CMA (March 18, 2010 2:26 PM) CC: f/u  Is Patient Diabetic? No Pain Assessment Patient in pain? no      Nutritional Status BMI of < 19 = underweight Nutritional Status Detail nl  Does patient need assistance? Functional Status Self care Ambulation Normal   Physical Exam  General:  alert, well-developed, appropriate dress, cooperative to examination, good hygiene, and cachetic.   Head:  Normocephalic and atraumatic without obvious abnormalities. No apparent alopecia or balding. Eyes:  No corneal or conjunctival inflammation noted. EOMI. Perrla. Funduscopic exam benign, without hemorrhages, exudates or papilledema. Vision grossly normal. Mouth:  pharynx pink and moist, no erythema, no exudates, and poor dentition.  No placques noted Neck:  No deformities, masses, or tenderness noted. Chest Wall:  No deformities, masses, tenderness or gynecomastia noted. Breasts:  gynecomastia.   Lungs:  Normal respiratory effort, chest expands symmetrically. Lungs are clear to auscultation, no crackles or wheezes. Heart:  Normal rate and regular rhythm. S1 and S2 normal without gallop, murmur, click, rub or other extra sounds. Abdomen:  Bowel sounds positive,abdomen soft and non-tender without masses, organomegaly or hernias noted. Extremities:  No clubbing, cyanosis, edema, or deformity noted with normal full range of motion of all joints.   Neurologic:  alert & oriented X3, cranial nerves II-XII intact, strength normal in all extremities, and gait normal.   Skin:  Intact without suspicious lesions or rashes Psych:  Oriented X3, memory intact for recent and remote, flat affect, and eye contact better.     Impression & Recommendations:  Problem #  1:  BLURRED VISION (ICD-368.8) Assessment Unchanged  To see ophtho next week.  Instructed to inform clinic or Bridge Counselor if symptoms worsen.  Orders: Est. Patient Level IV ([address]56)  Problem # 2:  CANDIDIASIS (ICD-112.9) Assessment: Improved  Cleared.  Good response to fluconazole therapy.  will continue to monitor.  Orders: Est. Patient Level IV ([address])  Problem # 3:  NIGHT SWEATS (ICD-780.8) Assessment: Unchanged  May be associated with HIV.  Will continue to monitor  Orders: Est. Patient Level IV ([address])  Problem # 4:  COUGH (ICD-786.2) Assessment: Unchanged Not a remarkable complaint, breath sounds vesicular in bases which is a change from previous assessment.  May be tobacco-related. Will monitor. His updated medication list for this problem includes:    Bactrim Ds 800-160 Mg Tabs (Sulfamethoxazole-trimethoprim) ..... One tab by mouth once daily    Azithromycin 600 Mg Tabs (Azithromycin) .Marland Kitchen... Take 2 tablets once a week  Problem # 5:  WASTING SYNDROME (ICD-799.4) Assessment: Unchanged Has not started Ensure supplements.  Hopeful as soon as she resumes ARV therapy we will see an improvement.  Will obtain Ensure today through [facility] and will investigate possible Food Stamp application.  Problem # 6:  HYPOKALEMIA, MILD (ICD-276.8) Assessment: New  Serum potassium was 3.2.  Could be a secondary result of malnutrition and poor oral intake.  Potassium-rich diet was reviewed and coupled with Ensure supplements we hope to see an improvement.  We will follow-up with this on the next blood draw.  Orders: Est. Patient Level IV ([address])  Problem # 7:  HIV DISEASE (ICD-042)  CD4< 20 VL approx 98K.  Is ready to re-start ARV therapy.  Agreed to plan of using past regimen of Prezista, Norvir, Truvada for at least three months, then we will re-evaluate therapy if tolerance is an issue.  [facility]  Bridge Counselor to also f/u with medication adherence.  Spent  >20 minutes reviewing regimen and emphazing the importance of treatment adherence.  Verbally aknowledged understanding.  Will repeat staging labs in 4 weeks with f/u in clinic in 6 weeks with pharmacy and Bridge Counselor f/u between visits. His updated medication list for this problem includes:    Bactrim Ds 800-160 Mg Tabs (Sulfamethoxazole-trimethoprim) ..... One tab by mouth once daily    Azithromycin 600 Mg Tabs (Azithromycin) .Marland Kitchen... Take 2 tablets once a week    Fluconazole 150 Mg Tabs (Fluconazole) .Marland Kitchen... Take 1 tablet by mouth once a day for 15 days    Nystatin 100000 Unit/ml Susp (Nystatin) ..... Swallow 5 ml three times a day for 15 days  Orders: Est. Patient Level IV ([address])  Other Orders: Future Orders: T-HIV Viral Load ([id number]) ... 04/19/2010 T-CBC w/Diff ([id number]) ... 04/19/2010 T-CD4SP ([facility]) (CD4SP) ... 04/19/2010 T-Comprehensive Metabolic Panel [id number]) ... 04/19/2010  Prescriptions: NORVIR 100 MG TABS (RITONAVIR) Take 1 tablet by mouth once a day  #30 x 5   Entered by:   Starleen Arms CMA   Authorized by:   Talmadge Chad NP   Signed by:   Starleen Arms CMA on 03/18/2010   Method used:   Telephoned to ...       [facility] (mail-order)       [address]       [place], [place]  [address]  Ph: [phone]       Fax: [phone]   RxID:   [id number] PREZISTA 400 MG TABS (DARUNAVIR ETHANOLATE) Take 2 tablets by mouth once a day  #60 x 5   Entered by:   Starleen Arms CMA   Authorized by:   Talmadge Chad NP   Signed by:   Starleen Arms CMA on 03/18/2010   Method used:   Telephoned to ...       [facility] (mail-order)       [address]       [place], [place]  [address]       Ph: [phone]       Fax: [phone]   RxID:   [id number] TRUVADA 200-300 MG TABS (EMTRICITABINE-TENOFOVIR) Take 1 tablet by mouth once a day  #30 x 5   Entered by:   Starleen Arms CMA    Authorized by:   Talmadge Chad NP   Signed by:   Starleen Arms CMA on 03/18/2010   Method used:   Telephoned to ...       [facility] (mail-order)       [address]       [place], [place]  [address]       Ph: [phone]       Fax: [phone]   RxID:   [id number] AZITHROMYCIN 600 MG TABS (AZITHROMYCIN) take 2 tablets once a week  #10 x 5   Entered by:   Starleen Arms CMA   Authorized by:   Talmadge Chad NP   Signed by:   Starleen Arms CMA on 03/18/2010   Method used:   Faxed to ...       [facility] (mail-order)       [address]       [place], [place]  [address]       Ph: [phone]       Fax: [phone]   RxID:   [id number] BACTRIM DS 800-160 MG TABS (SULFAMETHOXAZOLE-TRIMETHOPRIM) one tab by mouth once daily  #30 x 5   Entered by:   Starleen Arms CMA   Authorized by:   Talmadge Chad NP   Signed by:   Starleen Arms CMA on 03/18/2010   Method used:   Faxed to ...       [facility] (mail-order)       [address]       [place], [place]  [address]       Ph: [phone]       Fax: [phone]   RxID:   [id number]   Prevention & Chronic Care Immunizations   Influenza vaccine: Fluvax 3+  (04/09/2008)    Tetanus booster: Not documented    Pneumococcal vaccine: Pneumovax  (12/20/2005)  Other Screening   Smoking status: current  (03/18/2010)   Nursing Instructions: Give Flu vaccine today

## 2010-06-29 NOTE — Miscellaneous (Signed)
Summary: Bridge Counselor  Clinical Lists Changes BC referred pt to [facility] for the TAP program to access her ensure. BC also spoke with [facility] today and pt is not eligible for their services until they have scripts for HIV medications. All scripts have been transferred out to the [facility] on [address] until she has HIV meds ordered.

## 2010-06-29 NOTE — Miscellaneous (Signed)
Summary: Bridge Counselor  Clinical Lists Changes BC enrolled pt into the bridge counseling program today. Pt states that he has been "kicked out" of the clinic and cannot go back. Pt reports this as his reason for missed appointments in addition to transportation. BC will discuss with clinic staff if pt returning to [facility] is an option.

## 2010-06-29 NOTE — Assessment & Plan Note (Signed)
Summary: work in/brad to see   CC:  work in.  History of Present Illness: Stephen Griffin is a 30 yr old African American male with long history of advanced HIV disease along with a history of poor adherence to clinical follow-up and treatment plans.  He presents today for labs and has as a chief complaint of thrush with dysphagia and odynophagia.  He complains of generalized malaise, weakness, anorexia, continued weight loss, sweats and 2-day h/o dry non-productive cough.  He last took any medications for HIV and prophylaxis 3 months back.  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     Allergies: 1)  ! * Pneumovax   Preventive Screening-Counseling & Management  Alcohol-Tobacco     Alcohol type: beer     Smoking Status: current     Packs/Day: 0.5     Cigars/week: 2  Caffeine-Diet-Exercise     Caffeine use/day: soda     Does Patient Exercise: no  Safety-Violence-Falls     Seat Belt Use: yes   Current Allergies (reviewed today): ! * PNEUMOVAX Review of Systems General:  Complains of chills, fatigue, loss of appetite, malaise, sweats, weakness, and weight loss. Eyes:  Complains of blurring; denies discharge, double vision, eye irritation, eye pain, halos, itching, light sensitivity, red eye, vision loss-1 eye, and vision loss-both eyes. ENT:  Complains of difficulty swallowing, hoarseness, and sore throat; denies decreased hearing, ear discharge, earache, nasal congestion, nosebleeds, postnasal drainage, ringing in ears, and sinus pressure. CV:  Denies bluish discoloration of lips or nails, chest pain or discomfort, difficulty breathing at night, difficulty breathing while lying down, fainting, fatigue, leg cramps with exertion, lightheadness, near fainting, palpitations, shortness of breath with exertion, swelling of feet,  swelling of hands, and weight gain. Resp:  Complains of cough; denies chest discomfort, chest pain with inspiration, coughing up blood, excessive snoring, hypersomnolence, morning headaches, pleuritic, shortness of breath, sputum productive, and wheezing; 2-day history of dry non-productive cough. GI:  Complains of loss of appetite; denies abdominal pain, bloody stools, change in bowel habits, constipation, dark tarry stools, diarrhea, excessive appetite, gas, hemorrhoids, indigestion, nausea, vomiting, vomiting blood, and yellowish skin color. GU:  Denies decreased libido, discharge, dysuria, erectile dysfunction, genital sores, hematuria, incontinence, nocturia, urinary frequency, and urinary hesitancy. MS:  Denies joint pain, joint redness, joint swelling, loss of strength, low back pain, mid back pain, muscle aches, muscle , cramps, muscle weakness, stiffness, and thoracic pain. Derm:  Denies changes in color of skin, changes in nail beds, dryness, excessive perspiration, flushing, hair loss, insect bite(s), itching, lesion(s), poor wound healing, and rash. Neuro:  Denies brief paralysis, difficulty with concentration, disturbances in coordination, falling down, headaches, inability to speak, memory loss, numbness, poor balance, seizures, sensation of room spinning, tingling, tremors, visual disturbances, and weakness. Psych:  Complains of depression; denies alternate hallucination ( auditory/visual), anxiety, easily angered, easily tearful, irritability, mental problems, panic attacks, sense of great danger, suicidal thoughts/plans, thoughts of violence, unusual visions or sounds, and thoughts /plans of harming others.  Vital Signs:  Patient profile:   30 year old male Height:      71 inches (180.34 cm) Weight:      124.8 pounds (56.73 kg) BMI:     17.47 Temp:     99.1 degrees F (37.28 degrees C) oral Pulse rate:   93 /  minute BP sitting:   107 / 70  (left arm)  Vitals Entered By: Kathi Simpers  [facility]) (March 08, 2010 8:53 AM) CC: work in Pain Assessment Patient in pain? no      Nutritional Status BMI of < 19 = underweight Nutritional Status Detail appetite is not good per patient  Have you ever been in a relationship where you felt threatened, hurt or afraid?No   Does patient need assistance? Functional Status Self care Ambulation Normal   Physical Exam  General:  alert, well-developed, cooperative to examination, good hygiene, and cachetic.   Head:  Normocephalic and atraumatic without obvious abnormalities. No apparent alopecia or balding. Eyes:  fundoscopic fields poorly visualized, no gross retinal hemorrhages or exudates noted, visual fields full and equal per confrontation Ears:  External ear exam shows no significant lesions or deformities.  Otoscopic examination reveals clear canals, tympanic membranes are intact bilaterally without bulging, retraction, inflammation or discharge. Hearing is grossly normal bilaterally. Mouth:  poor dentition, excessive plaque, pharyngeal erythema, and white plaque(s).   Neck:  No deformities, masses, or tenderness noted. Chest Wall:  No deformities, masses, tenderness or gynecomastia noted. Breasts:  gynecomastia.   Lungs:  normal respiratory effort, no intercostal retractions, no accessory muscle use, no dullness, no fremitus, and no crackles.  bronchiolar sounds heard in bilateral A&P bases. Heart:  regular rhythm, no murmur, no gallop, no rub, no JVD, no HJR, physiological split S2, and tachycardia.   Abdomen:  Bowel sounds positive,abdomen soft and non-tender without masses, organomegaly or hernias noted. Rectal:  No external abnormalities noted. Normal sphincter tone. No rectal masses or tenderness. Msk:  No deformity or scoliosis noted of thoracic or lumbar spine.   Extremities:  No clubbing, cyanosis, edema, or deformity noted with normal full range of motion of all joints.   Neurologic:  alert & oriented X3, cranial  nerves II-XII intact, strength normal in all extremities, sensation intact to light touch, sensation intact to pinprick, and gait normal.   Skin:  Mild facial seborrhea noted Cervical Nodes:  Shoddy A&P cervical LN's Axillary Nodes:  Shoddy axillary LN's Inguinal Nodes:  R inguinal LN enlarged and L  inguinal LN enlarged, both shoddy in character R inguinal LN enlarged and L  inguinal LN enlarged.   Psych:  Oriented X3, memory intact for recent and remote, not agitated, flat affect, subdued, and poor eye contact.  Oriented X3, memory intact for recent and remote, and not agitated.     Impression & Recommendations:  Problem # 1:  CANDIDIASIS (ICD-112.9) Recurrence most likely associated with poor self care and medication non-adherence.  Will resume Fluconazole 150mg  once daily for now until re-evaluated.  Problem # 2:  NIGHT SWEATS (ICD-780.8) Along with wasting, fatigue, and malaise.  Cannot exclude febrile episodes, but he has not taken or recorded any temps to verify this.  Must consider deep-seeded infection with a history of poor adherence to HIV treatment and prophylaxis.  Will add blood cultures for AFB with today's blood draw.  Problem # 3:  COUGH (ICD-786.2)  His updated medication list for this problem includes:    Bactrim Ds 800-160 Mg Tabs (Sulfamethoxazole-trimethoprim) ..... One tab by mouth once daily    Azithromycin 600 Mg Tabs (Azithromycin) .Marland Kitchen... Take 2 tablets once a week While this is new and other symptoms not coinciding in symptom time-line, physical exam only revealed bronchiolar sounds in bases.  Still must consider early PCP, but at present will warrant monitoring for now.  Problem # 4:  WASTING SYNDROME (ICD-799.4) Most likely associated with advanced HIV disease, still must consider other OI's including MAC and CMV  Problem # 5:  BLURRED VISION (ICD-368.8) Needs optho consult for dilation and more thorough evaluation  Problem # 6:  HIV DISEASE (ICD-042) His  poor adherence to treatment, prophylaxis, and follow-up has left many issues to be considered for his ongoing care.  Long discussion with him regarding treatment and follow-up care was provided.  He has agreed to take only the medications we precribe today, have labs drawn, and return in ten days for follow-up.  At that time, barring any unforeseen issues, we will discuss resumption of his HIV therapy and other additional plans for care.  He has agreed to this plan verbally and aknowledged that 3 consecutive missed follow-up appointments without undue reason will require a re-evaluation of his care with this provider.  At present, we will provide treatment for his thrush, PCP and MAC prophy, and meds for his peripheral neuropoathy.  His updated medication list for this problem includes:    Bactrim Ds 800-160 Mg Tabs (Sulfamethoxazole-trimethoprim) ..... One tab by mouth once daily    Azithromycin 600 Mg Tabs (Azithromycin) .Marland Kitchen... Take 2 tablets once a week    Fluconazole 150 Mg Tabs (Fluconazole) .Marland Kitchen... Take 1 tablet by mouth once a day for 15 days      Orders: T-Comprehensive Metabolic Panel [phone]) T-CD4SP [facility] (CD4SP) T-HIV Viral Load [phone]) T-Lipid Profile [phone]) T-Culture, Blood Routine ([id number])  Problem # 7:  PERIPHERAL NEUROPATHY (ICD-356.9) Renew tramadol 50mg  as previously ordered.  Other Orders: Ophthalmology Referral (Ophthalmology) T-CBC w/Diff [phone])  Patient Instructions: 1)  Follow-up in 10 days 2)  Long discussion regarding adherence to meds and follow-up. You agreed with this plan. 3)  If symptoms of weakness, fever, and/or cough persist go to ER for evaluation. Prescriptions: BACTRIM DS 800-160 MG TABS (SULFAMETHOXAZOLE-TRIMETHOPRIM) one tab by mouth once daily  #30 x 1   Entered by:   Tomasita Morrow RN   Authorized by:   Talmadge Chad NP   Signed by:   Paulette Blanch Dam MD on 03/08/2010   Method used:   Faxed to ...        [facility] (retail)       [address]       [address]       [place], [place]  [address]       Ph: [phone]       Fax: [phone]   RxID:   [id number] AZITHROMYCIN 600 MG TABS (AZITHROMYCIN) take 2 tablets once a week  #10 x 1   Entered by:   Tomasita Morrow RN   Authorized by:   Talmadge Chad NP   Signed by:   Paulette Blanch Dam MD on 03/08/2010   Method used:   Faxed to ...       [facility] (retail)       [address]       [address]       [place], [place]  [address]       Ph: [phone]       Fax: [phone]   RxID:   [id number] TRAMADOL HCL 50 MG TABS (TRAMADOL HCL) every 4 hrs as needed for pain  #120 x 1   Entered and Authorized by:   Talmadge Chad NP   Signed by:   Talmadge Chad NP on 03/08/2010   Method used:   Print  then Give to Patient   RxID:   [id number] FLUCONAZOLE 150 MG TABS (FLUCONAZOLE) Take 1 tablet by mouth once a day for 15 days  #15 x 1   Entered and Authorized by:   Talmadge Chad NP   Signed by:   Talmadge Chad NP on 03/08/2010   Method used:   Print then Give to Patient   RxID:   [id number] AZITHROMYCIN 600 MG TABS (AZITHROMYCIN) take 2 tablets once a week  #10 x 1   Entered and Authorized by:   Talmadge Chad NP   Signed by:   Talmadge Chad NP on 03/08/2010   Method used:   Print then Give to Patient   RxID:   [id number] BACTRIM DS 800-160 MG TABS (SULFAMETHOXAZOLE-TRIMETHOPRIM) one tab by mouth once daily  #30 x 1   Entered and Authorized by:   Talmadge Chad NP   Signed by:   Talmadge Chad NP on 03/08/2010   Method used:   Print then Give to Patient   RxID:   [id number]   Appended Document: work in/brad to see Serum K+ was 3.2 today.  Instructions given to contact patient and encourage potassium-rich diet.  Will repeat a BMP on follow-up visit in 10 days.  Appended Document: work in/brad to  see Rx for Ensure written and given to [employer] to have filled by [facility].   Clinical Lists Changes  Medications: Added new medication of ENSURE PLUS  LIQD (NUTRITIONAL SUPPLEMENTS) 1 can by mouth three times a day - Signed Rx of ENSURE PLUS  LIQD (NUTRITIONAL SUPPLEMENTS) 1 can by mouth three times a day;  #3 cases x 6;  Signed;  Entered by: Talmadge Chad NP;  Authorized by: Talmadge Chad NP;  Method used: Print then Give to Patient    Prescriptions: ENSURE PLUS  LIQD (NUTRITIONAL SUPPLEMENTS) 1 can by mouth three times a day  #3 cases x 6   Entered and Authorized by:   Talmadge Chad NP   Signed by:   Talmadge Chad NP on 03/10/2010   Method used:   Print then Give to Patient   RxID:   [id number]

## 2010-06-29 NOTE — Miscellaneous (Signed)
Summary: Hospital Admission  INTERNAL MEDICINE ADMISSION HISTORY AND PHYSICAL  Attending: Dr. Maurice March, Marcial Pacas R1: Dr. Scot Dock [phone] R2: Dr. Cena Benton [phone]   PCP:Dr. Philipp Deputy  CZ:[id number] pain, N/V/D; thrush.  HPI: 30 y/o male with pmh significant for HIV/AIDS and medication non compliance; with last Cd4 count of 20 one year ago; who comes tot he ED complaining of abdominal pain, nausea, vomiting and diarrhea for the last 3-4 days. Patient reports that he is unable to keep anything down and endorses bad taste and difficulty swallowing. He said that he experience 3-4 loose stool per day and that is a white mucoid discharge. He also reports a white lesions inside his mouth.  Patient deneis any melena, hematochezia, CP, cough or SOB.  Of note patient has been w/o medications and medical followup for the last 12 months.  ALLERGIES: NKDA   PAST MEDICAL HISTORY: Past Medical History: Asthma HIV disease Finger abscess and cellulitis  MEDICATIONS:    (Currently not taking any meds for over a year).   BACTRIM DS 800-160 MG TABS (SULFAMETHOXAZOLE-TRIMETHOPRIM) one tab by mouth once daily AZITHROMYCIN 600 MG TABS (AZITHROMYCIN) take 2 tablets once a week TRUVADA 200-300 MG TABS (EMTRICITABINE-TENOFOVIR) Take 1 tablet by mouth once a day PREZISTA 400 MG TABS (DARUNAVIR ETHANOLATE) Take 2 tablets by mouth once a day * NORVIR 100 MG TABS (RITONAVIR) Take 1 tablet by mouth once a day   SOCIAL HISTORY: .addsh Substance use: Insurance: FAMILY HISTORY No contributory.  ROS: No cough, no fever, no Melena, no Hematochezia, no CP, no SOB.  VITALS: T:  97.4     P: 94     BP:  133/83    R: 16       O2SAT:  100%   ON:RA  PHYSICAL EXAM: General:  alert, in NAD and cooperative to examination.   Head:  normocephalic and atraumatic.   Eyes:  vision grossly intact, pupils equal, pupils round, pupils reactive to light, no injection and anicteric.   Mouth:  severe thrush involving tongue, hard  ans soft palate, mild to moderate mucose dryness.   Neck:  supple, full ROM, no thyromegaly, no JVD, and no carotid bruits.   Lungs:  normal respiratory effort, no accessory muscle use, normal breath sounds, no crackles, and no wheezes.  Heart:  normal rate, regular rhythm, no murmur, no gallop, and no rub.   Abdomen:  soft, diffuse tenderness (especially epigastrium and middle abdomen), normal bowel sounds, no distention, no guarding, no rebound, no hepatomegaly, and no splenomegaly.   Extremities:  No cyanosis, clubbing, edema  Neurologic:  alert & oriented X3, cranial nerves II-XII intact, strength normal in all extremities, sensation intact to light touch, and gait normal.   Skin:  turgor normal and no rashes.   Rectal exam: mucoid discharge, external hemorrhoids, multiple fissures.  LABS: Color, Urine                             YELLOW            YELLOW  Appearance                               CLEAR             CLEAR  Specific Gravity  1.005             1.005-1.030  pH                                       7.0               5.0-8.0  Urine Glucose                            NEGATIVE          NEG              mg/dL  Bilirubin                                NEGATIVE          NEG  Ketones                                  NEGATIVE          NEG              mg/dL  Blood                                    NEGATIVE          NEG  Protein                                  NEGATIVE          NEG              mg/dL  Urobilinogen                             1.0               0.0-1.0          mg/dL  Nitrite                                  NEGATIVE          NEG  Leukocytes                               NEGATIVE          NEG    MICROSCOPIC NOT DONE ON URINES WITH NEGATIVE PROTEIN, BLOOD, LEUKOCYTES,    NITRITE, OR GLUCOSE <1000 mg/dL.  Lipase                                   17                11-59            U/L  Sodium (NA)  139                135-145          mEq/L  Potassium (K)                            3.1        l      3.5-5.1          mEq/L  Chloride                                 107               96-112           mEq/L  CO2                                      27                19-32            mEq/L  Glucose                                  70                70-99            mg/dL  BUN                                      4          l      6-23             mg/dL  Creatinine                               0.71              0.4-1.5          mg/dL  GFR, Est Non African American            >60               >60              mL/min  GFR, Est African American                >60               >60              mL/min    Oversized comment, see footnote  1  Bilirubin, Total                         0.5               0.3-1.2          mg/dL  Alkaline Phosphatase                     73                39-117  U/L  SGOT (AST)                               35                0-37             U/L  SGPT (ALT)                               25                0-53             U/L  Total  Protein                           7.4               6.0-8.3          g/dL  Albumin-Blood                            3.3        l      3.5-5.2          g/dL  Calcium                                  8.4               8.4-10.5         mg/dL   WBC                                      4.0               4.0-10.5         K/uL  RBC                                      4.14       l      4.22-5.81        MIL/uL  Hemoglobin (HGB)                         13.2              13.0-17.0        g/dL  Hematocrit (HCT)                         38.9       l      39.0-52.0        %  MCV                                      93.9              78.0-100.0       fL  MCHC  34.0              30.0-36.0        g/dL  RDW                                      11.7              11.5-15.5        %  Platelet Count (PLT)                     180                150-400          K/uL  Neutrophils, %                           82         h      43-77            %  Lymphocytes, %                           10         l      12-46            %  Monocytes, %                             8                 3-12             %  Eosinophils, %                           0                 0-5              %  Basophils, %                             0                 0-1              %  Neutrophils, Absolute                    3.3               1.7-7.7          K/uL  Lymphocytes, Absolute                    0.4        l      0.7-4.0          K/uL  Monocytes, Absolute                      0.3               0.1-1.0          K/uL  Eosinophils, Absolute  0.0               0.0-0.7          K/uL  Basophils, Absolute                      0.0               0.0-0.1          K/uL   Images studies IMPRESSION:   1.  No acute cardiopulmonary disease.  Probable bilateral nipple   shadows.  If the patient has a history of malignancy, follow-up   chest radiograph with nipple markers could be performed.   2.  Nonobstructive bowel gas pattern.   ASSESSMENT AND PLAN:  (1) Abdominal pain, N/V/D: Most likely 2/2 either intestinal candidiasis vs CMV vs MAC vs C. Diff vs Cryptosporidium. Will check stool cx's and will include ova and parasite. Will also start agressive treatment with fluconazole and nystatin (swift and swallowing); will provide antiemetics, antiacids, fluid resucitation and also electrolytes repletion. Will start patient on clear liquids and advance his diet as tolerated.  Patient might need GI inputs and colonoscopy for tissue biopsy and further assesment.  (2) HIV/AIDS: Patient last CD4 count level was 20; and currently not taking any antiretrovirals. Will check CD4 count levels and also viral load. Will start bactrim and zithromax for prophylaxis. He will need genotype and treatment with antiretrovirals as an outpatient if he demonstarted  medication compliance.  (3) Hypokalemia: 2/2 diarrhea and volume contraction. Will replete potassium and will check magnessium.   (4) Thrush: will treat with IV fluconazole and also nystatin; will change to by mouth as soon as patient is swallowing improved.  (5) VTE prophylaxis: lovenox 30mg  [place] daily

## 2010-06-29 NOTE — Miscellaneous (Signed)
Summary: [facility]: Orders  [facility]: Orders   Imported By: Florinda Marker 06/15/2009 13:59:14  _____________________________________________________________________  External Attachment:    Type:   Image     Comment:   External Document

## 2010-06-29 NOTE — Progress Notes (Signed)
Summary: Patient discharged  Phone Note Call from Patient   Details for Reason: Release from practice Summary of Call: After reviewing the emr and centricity business and at the sugestion of the patients pcp, I am sending a second registered letter discharging [facility].  The original was dated September 2010.  Please do not schedule additional appointments.  Clelia Croft Initial call taken by: Raynaldo Opitz,  June 04, 2009 2:51 PM

## 2010-06-29 NOTE — Miscellaneous (Signed)
Summary: Bridge Counselor  Clinical Lists Changes BC transported pt to [facility] today for lab work. Pt will return on December 5th to see Traci Sermon. BC also called DSS today to get pt another copy of her medicaid card for her eye appointment. Pt should receive the card in 7-10 days.

## 2010-06-29 NOTE — Progress Notes (Signed)
Summary: med change/gp  Phone Note Refill Request Message from:  Fax from Pharmacy on November 19, 2009 9:11 AM  Refills Requested: Medication #1:  GANCICLOVIR 500 MG CAPS 2 tablets three times a day for 15 days Ganciclovir caps are not available; only vials are available for injection per phaarmacy. Can u order  alternative med.   Thanks   Method Requested: Electronic Initial call taken by: Chinita Pester RN,  November 19, 2009 9:12 AM  Follow-up for Phone Call        Changed medicine to valcyte. Medicaid will cover for it. Called the prescription to pharmacy.   Thanks Follow-up by: Bethel Born MD,  November 20, 2009 8:58 AM    New/Updated Medications: VALCYTE 450 MG TABS (VALGANCICLOVIR HCL) Take 1 tablet by mouth two times a day with meals for 15 weeks Prescriptions: VALCYTE 450 MG TABS (VALGANCICLOVIR HCL) Take 1 tablet by mouth two times a day with meals for 15 weeks  #30 x 0   Entered and Authorized by:   Bethel Born MD   Signed by:   Bethel Born MD on 11/20/2009   Method used:   Telephoned to ...       [facility] [place] [phone]* (retail)       [phone]       [place], [place]  [address]       Ph: [phone] or [phone]       Fax: [phone]   RxID:   [id number]

## 2010-06-29 NOTE — Miscellaneous (Signed)
Summary: PAtient discharged from ID office.  Patient is discharged from ID office. Patient scheduled at   [facility] hospital ID clinic [facility].  [place] [id number] ph: [phone] On: 12/14/2009 at 1:45 pm  Called the patient to let him know regarding this appointment

## 2010-06-29 NOTE — Miscellaneous (Signed)
Summary: Bridge Counselor  Clinical Lists Changes BC made a home visit with pt today. BC let pt know that he has one last chance to remain a pt of [facility]. Pt is aware that he will not be seeing Dr. Philipp Deputy and that if he does not remain compliant with his appointments and medications that he will be dimissed from the clinic for good. Pt still has severe thrush and complains of decreased appetite. Pt will come in for labs on October 13th at 2pm and then return to see Traci Sermon. BC will assist pt with transportation to his scheduled appointments.  Sharol Roussel  March 05, 2010 12:30 PM   Appended Document: Bridge Counselor Pt will now be seen on Monday October 10th to address his thrush, per Traci Sermon. Pt will also have labs while he is in the clinic.

## 2010-06-29 NOTE — Miscellaneous (Signed)
Summary: Bridge Counselor  Clinical Lists Changes BC transported pt to [facility] today to pick up her medications. [facility] had not sent the medications over on Tuesday like they said they had. BC called [facility] and the Rxs faxed over and filled. Pt did obtain her medications and began taking all of them this morning. BC provided Tx adherence to pt and discussed all medications and when and how to take them. Pt expressed understanding.

## 2010-06-29 NOTE — Progress Notes (Signed)
Summary: Med refill denied--pt discharged from clinic  Phone Note From Pharmacy   Caller: [facility] Reason for Call: Needs renewal Summary of Call: Received a fax refill Management request for patient's medication of Norvir, Prezista, Bactrim DS, and Truvada.  I called to [facility] at [phone] and told them that we cannot refill any medication for patient because we have discharged him from our clinic.  The young man I spoke to made a note of that for the future. Initial call taken by: Paulo Fruit  BS,CPht II,MPH,  June 11, 2009 9:09 AM

## 2010-06-29 NOTE — Miscellaneous (Signed)
Summary: Bridge Counselor  Clinical Lists Changes BC transported pt to [facility] today to see Traci Sermon and to have lab work. Pt was given four medications today- BC provided Tx adherence and discussed in detail each medication with pt. BC also provided a pillbox, lab tracker and condoms. Pt will return in 10 days to discuss restarting his HIV medications.

## 2010-07-01 NOTE — Miscellaneous (Signed)
Summary: Bridge Counselor  Clinical Lists Changes BC transported pt to [facility] today for lab work. Pt complaining that the trazodone is not helping with sleep. Spoke with Traci Sermon while in clinic and was given a new Rx for lorazepam. Tomasita Morrow called Rx into [facility] for delivery. BC discussed with pt the new medication, how and when to take it. Pt instructed to call clinic with any questions or concerns prior to two week follow up visit.

## 2010-07-01 NOTE — Miscellaneous (Signed)
  Clinical Lists Changes  Observations: Added new observation of PAYOR: Medicaid (06/08/2010 15:05)

## 2010-07-01 NOTE — Miscellaneous (Signed)
Summary: Bridge Counselor  Clinical Lists Changes BC transported pt to [facility] today for her scheduled appointment with Traci Sermon. Pt will return in four weeks for labs and six weeks to see Brad. BC will continue to work with pt to ensure compliance with her medications and her appointments. Following pt's next clinic visit, BC wll refer pt to [facility] for ongoing case management.

## 2010-07-01 NOTE — Progress Notes (Signed)
Summary: Problems sleeping  Phone Note Call from Patient   Summary of Call: Pt having problems sleeping. Requesting new med. Tomasita Morrow RN  June 10, 2010 10:07 AM   Follow-up for Phone Call        Will hold trazodone for now.  Will try lorazepam 1 mg by mouth at bedtime as needed and check response on RTC in 2 weeks. Follow-up by: Talmadge Chad NP,  June 10, 2010 10:15 AM    New/Updated Medications: LORAZEPAM 1 MG TABS (LORAZEPAM) 1 by mouth at bedtime as needed sleep Prescriptions: LORAZEPAM 1 MG TABS (LORAZEPAM) 1 by mouth at bedtime as needed sleep  #15 x 0   Entered and Authorized by:   Talmadge Chad NP   Signed by:   Talmadge Chad NP on 06/10/2010   Method used:   Telephoned to ...       [facility] (mail-order)       [address]       [place], [place]  [address]       Ph: [phone]       Fax: [phone]   RxID:   [id number]  Phoned to [facility] @ [phone] [facility] RN  June 10, 2010 10:31 AM

## 2010-07-01 NOTE — Assessment & Plan Note (Signed)
Summary: f/u [mkj]    Primary Provider:  Talmadge Chad NP  CC:  follow-up visit.  History of Present Illness: Feeling much better.  Adherent with ARV's and prophylaxis meds.  C/O dental pain on left lower side of mouth.  Also c/o insomnia, not being able to fall or stay asleep at night.  Preventive Screening-Counseling & Management  Alcohol-Tobacco     Alcohol drinks/day: 4 week     Alcohol type: beer     Smoking Status: current     Smoking Cessation Counseling: yes     Smoke Cessation Stage: precontemplative     Packs/Day: 0.5     Cigars/week: 2  Caffeine-Diet-Exercise     Caffeine use/day: soda     Does Patient Exercise: yes     Type of exercise: walking  Hep-HIV-STD-Contraception     HIV Risk: no risk noted     HIV Risk Counseling: not indicated-no HIV risk noted  Safety-Violence-Falls     Seat Belt Use: yes  Comments: given condoms      Sexual History:  dating.        Drug Use:  former.     Current Allergies (reviewed today): ! * PNEUMOVAX Social History: Sexual History:  dating Drug Use:  former  Review of Systems General:  Complains of sleep disorder; denies chills, fatigue, fever, loss of appetite, malaise, sweats, weakness, and weight loss. Eyes:  Denies blurring, discharge, double vision, eye irritation, eye pain, halos, itching, light sensitivity, red eye, vision loss-1 eye, and vision loss-both eyes. ENT:  See HPI; Denies decreased hearing, difficulty swallowing, ear discharge, earache, hoarseness, nasal congestion, nosebleeds, postnasal drainage, ringing in ears, sinus pressure, and sore throat. CV:  Denies bluish discoloration of lips or nails, chest pain or discomfort, difficulty breathing at night, difficulty breathing while lying down, fainting, fatigue, leg cramps with exertion, lightheadness, near fainting, palpitations, shortness of breath with exertion, swelling of feet, swelling of hands, and weight gain. Resp:  Denies chest discomfort,  chest pain with inspiration, cough, coughing up blood, excessive snoring, hypersomnolence, morning headaches, pleuritic, shortness of breath, sputum productive, and wheezing. GI:  Denies abdominal pain, bloody stools, change in bowel habits, constipation, dark tarry stools, diarrhea, excessive appetite, gas, hemorrhoids, indigestion, loss of appetite, nausea, vomiting, vomiting blood, and yellowish skin color. Derm:  Denies changes in color of skin, changes in nail beds, dryness, excessive perspiration, flushing, hair loss, insect bite(s), itching, lesion(s), poor wound healing, and rash. Neuro:  Denies brief paralysis, difficulty with concentration, disturbances in coordination, falling down, headaches, inability to speak, memory loss, numbness, poor balance, seizures, sensation of room spinning, tingling, tremors, visual disturbances, and weakness. Psych:  Denies alternate hallucination ( auditory/visual), anxiety, depression, easily angered, easily tearful, irritability, mental problems, panic attacks, sense of great danger, suicidal thoughts/plans, thoughts of violence, unusual visions or sounds, and thoughts /plans of harming others; Mood much better, claims less depression..  Vital Signs:  Patient profile:   30 year old male Height:      71 inches (180.34 cm) Weight:      124.75 pounds (56.70 kg) BMI:     17.46 Temp:     98.0 degrees F (36.67 degrees C) oral Pulse rate:   73 / minute BP sitting:   135 / 88  (left arm) Cuff size:   regular  Vitals Entered By: Jennet Maduro RN (May 10, 2010 2:46 PM) CC: follow-up visit Is Patient Diabetic? No Pain Assessment Patient in pain? yes     Location: stomach  Intensity: 5 Type: sharp Onset of pain  Intermittent Nutritional Status BMI of < 19 = underweight Nutritional Status Detail appetite "fine"  Have you ever been in a relationship where you felt threatened, hurt or afraid?No   Does patient need assistance? Functional Status Self  care Ambulation Normal, Impaired:Risk for fall, Wheelchair Comments I have missed two doses   Physical Exam  General:  alert, well-developed, well-hydrated, appropriate dress, normal appearance, cooperative to examination, good hygiene, and underweight appearing.   Head:  Normocephalic and atraumatic without obvious abnormalities. No apparent alopecia or balding. Eyes:  No corneal or conjunctival inflammation noted. EOMI. Perrla.  Mouth:  poor dentition,Multiple caries in left lower molar region with some teeth eroded or absent,  excessive plaque, and gingival recession.   Neck:  No deformities, masses, or tenderness noted. Chest Wall:  No deformities, masses, tenderness or gynecomastia noted. Lungs:  Normal respiratory effort, chest expands symmetrically. Lungs are clear to auscultation, no crackles or wheezes. Heart:  Normal rate and regular rhythm. S1 and S2 normal without gallop, murmur, click, rub or other extra sounds. Abdomen:  Bowel sounds positive,abdomen soft and non-tender without masses, organomegaly or hernias noted. Msk:  No deformity or scoliosis noted of thoracic or lumbar spine.   Pulses:  R and L carotid,radial,femoral,dorsalis pedis and posterior tibial pulses are full and equal bilaterally Extremities:  No clubbing, cyanosis, edema, or deformity noted with normal full range of motion of all joints.   Neurologic:  alert & oriented X3, cranial nerves II-XII intact, strength normal in all extremities, and gait normal.   Skin:  Intact without suspicious lesions or rashes Cervical Nodes:  Shoddy A&P cervical LN's Axillary Nodes:  Shoddy axillary LN's Psych:  Oriented X3 and memory intact for recent and remote, more interactive and talkative.  Oriented X3, memory intact for recent and remote, good eye contact, not anxious appearing, not depressed appearing, and not agitated.     Impression & Recommendations:  Problem # 1:  HIV DISEASE (ICD-042) CD4 10 @ 2% with HIV RNA 874  copies/ml @ 2.74 log.  Marked virologic response after 1 month adherent therapy.  CD4 progression may lag for a considerable period of time due to length of time of immunosupression.  Will continue present management, and repeat labs in 4 weeks with follow-up in 6 weeks. His updated medication list for this problem includes:    Bactrim Ds 800-160 Mg Tabs (Sulfamethoxazole-trimethoprim) ..... One tab by mouth once daily    Azithromycin 600 Mg Tabs (Azithromycin) .Marland Kitchen... Take 2 tablets once a week    Fluconazole 150 Mg Tabs (Fluconazole) .Marland Kitchen... Take 1 tablet by mouth once a day for 15 days    Nystatin 100000 Unit/ml Susp (Nystatin) ..... Swallow 5 ml three times a day for 15 days  Orders: T-RPR (Syphilis) ([id number]) Est. Patient Level IV ([address])Future Orders: T-CBC w/Diff ([id number]) ... 06/07/2010 T-CD4SP ([facility]) (CD4SP) ... 06/07/2010 T-Comprehensive Metabolic Panel [id number]) ... 06/07/2010 T-HIV Viral Load [id number]) ... 06/07/2010  Problem # 2:  CANDIDIASIS (ICD-112.9) Assessment: Improved  Problem # 3:  PERIODONTAL DISEASE (ICD-523.9) Peridex mouthwash 1/2 capful swish and spit two times a day .  Will provide list of Medicaid-approved dental service providers for further evaluation and care.  Agreed with plan.  Problem # 4:  INSOMNIA UNSPECIFIED (ICD-780.52) Assessment: New Will try trazodone 25-50mg  at bedtime as needed and monitor response on RTC.  Problem # 5:  WASTING SYNDROME (ICD-799.4) Assessment: Unchanged Still somewhat underweight, but BMI within NL.  Will  continue Ensure 1 can three times a day for now.  Problem # 6:  HYPOKALEMIA, MILD (ICD-276.8) Assessment: Unchanged Serum K+ 3.4 which remains below norm, but a little better.  Hopoefully as she continues to improve this will normalize.  Until then we will continue encouraging potassium-rich diet aqlong with supplements.  If this fails to improve by next assessment, we will add low-dose K+ supplkement to her  regimen.  Problem # 7:  BLURRED VISION (ICD-368.8) Assessment: Improved Seen by ophtho who reports no active disease.  Symptoms have improved.  Will continue to monitor.  Medications Added to Medication List This Visit: 1)  Chlorhexidine Gluconate 0.12 % Soln (Chlorhexidine gluconate) .... Swish and spit 1/2 capful two times a day as directed 2)  Trazodone Hcl 50 Mg Tabs (Trazodone hcl) .... 1/2 -1 tablet by mouth at bedtime as needed for sleep  Other Orders: Influenza Vaccine NON MCR ([id number])  Patient Instructions: 1)  Continue taking current meds as ordered. 2)  May take 1/2 to 1 tablet trazodone at bedtime as needed for sleep. 3)  Do not eat or drink fluids for 30 minutes after usining mouthwash. 4)  We will provide a list of approved dentists so you may schedule an evaluation of your dental problems. 5)  Return to clinic in 4 weeks for repeat labs. 6)  Please schedule follow-up visit for 6 weeks. Prescriptions: TRAZODONE HCL 50 MG TABS (TRAZODONE HCL) 1/2 -1 tablet by mouth at bedtime as needed for sleep  #30 x 2   Entered by:   Jennet Maduro RN   Authorized by:   Talmadge Chad NP   Signed by:   Jennet Maduro RN on 05/10/2010   Method used:   Faxed to ...       [facility] (mail-[facility])       [address]       [place], [place]  [address]       Ph: [phone]       Fax: [phone]   RxID:   [id number] CHLORHEXIDINE GLUCONATE 0.12 % SOLN (CHLORHEXIDINE GLUCONATE) Swish and spit 1/2 capful two times a day as directed  #1 bottle x 2   Entered by:   Jennet Maduro RN   Authorized by:   Talmadge Chad NP   Signed by:   Jennet Maduro RN on 05/10/2010   Method used:   Faxed to ...       [facility] (mail-order)       [address]       [place], [place]  [address]       Ph: [phone]       Fax: [phone]   RxID:   [id number] TRAZODONE HCL 50 MG TABS (TRAZODONE HCL) 1/2 -1 tablet by mouth at bedtime as needed for  sleep  #30 x 2   Entered and Authorized by:   Talmadge Chad NP   Signed by:   Talmadge Chad NP on 05/10/2010   Method used:   Print then Give to Patient   RxID:   [id number] CHLORHEXIDINE GLUCONATE 0.12 % SOLN (CHLORHEXIDINE GLUCONATE) Swish and spit 1/2 capful two times a day as directed  #1 bottle x 2   Entered and Authorized by:   Talmadge Chad NP   Signed by:   Talmadge Chad NP on 05/10/2010   Method used:   Print then Give to Patient   RxID:   [id number]   Prevention & Chronic Care Immunizations   Influenza vaccine:  Fluvax Non-MCR  (05/10/2010)    Tetanus booster: Not documented    Pneumococcal vaccine: Pneumovax  (12/20/2005)  Other Screening   Smoking status: current  (05/10/2010)   Smoking cessation counseling: yes  (05/10/2010)      Influenza Vaccine    Vaccine Type: Fluvax Non-MCR    Site: left deltoid    Mfr: novartis    Dose: 0.5 ml    Route: IM    Given by: Jennet Maduro RN    Exp. Date: 08/29/2010    Lot #: [id number]  Flu Vaccine Consent Questions    Do you have a history of severe allergic reactions to this vaccine? no    Any prior history of allergic reactions to egg and/or gelatin? no    Do you have a sensitivity to the preservative Thimersol? no    Do you have a past history of Guillan-Barre Syndrome? no    Do you currently have an acute febrile illness? no    Have you ever had a severe reaction to latex? no    Vaccine information given and explained to patient? yes

## 2010-07-01 NOTE — Miscellaneous (Signed)
  Clinical Lists Changes  Observations: Added new observation of INCOMESOURCE: UNKNOWN (06/24/2010 12:02) Added new observation of LATINO/HISP: No (06/24/2010 12:02)

## 2010-07-07 NOTE — Miscellaneous (Signed)
Summary: Bridge Counselor  Clinical Lists Changes BC transported pt to [facility] today to meet with Amy Faw at [facility]. Pt will be discharged from the bridge counseling program and will now be under medical case management with [facility].

## 2010-07-27 ENCOUNTER — Telehealth: Payer: Self-pay | Admitting: *Deleted

## 2010-08-04 ENCOUNTER — Telehealth: Payer: Self-pay | Admitting: Adult Health

## 2010-08-05 NOTE — Progress Notes (Signed)
  Phone Note Outgoing Call   Call placed by: Golden Circle RN,  July 27, 2010 9:42 AM Summary of Call: rec'd refill request for his tramadol. needs to see md before this can be refilled. both numbers in chart have been d/c. sent request back to pharmacy with a note that pt needs to call & make an appt Initial call taken by: Golden Circle RN,  July 27, 2010 9:42 AM

## 2010-08-09 LAB — BASIC METABOLIC PANEL
CO2: 27 mEq/L (ref 19–32)
Calcium: 9.2 mg/dL (ref 8.4–10.5)
Creatinine, Ser: 0.86 mg/dL (ref 0.4–1.5)
Glucose, Bld: 73 mg/dL (ref 70–99)

## 2010-08-09 LAB — DIFFERENTIAL
Basophils Absolute: 0 10*3/uL (ref 0.0–0.1)
Monocytes Absolute: 0.8 10*3/uL (ref 0.1–1.0)
Monocytes Relative: 10 % (ref 3–12)
Neutrophils Relative %: 74 % (ref 43–77)

## 2010-08-09 LAB — CBC
Hemoglobin: 15.7 g/dL (ref 13.0–17.0)
MCH: 32 pg (ref 26.0–34.0)
MCHC: 32.7 g/dL (ref 30.0–36.0)

## 2010-08-10 LAB — T-HELPER CELL (CD4) - (RCID CLINIC ONLY): CD4 T Cell Abs: 10 uL — ABNORMAL LOW (ref 400–2700)

## 2010-08-12 LAB — T-HELPER CELL (CD4) - (RCID CLINIC ONLY): CD4 % Helper T Cell: <1 % — ABNORMAL LOW (ref 33–55)

## 2010-08-15 LAB — COMPREHENSIVE METABOLIC PANEL
ALT: 25 U/L (ref 0–53)
AST: 35 U/L (ref 0–37)
Albumin: 3.3 g/dL — ABNORMAL LOW (ref 3.5–5.2)
Alkaline Phosphatase: 73 U/L (ref 39–117)
CO2: 27 mEq/L (ref 19–32)
Chloride: 107 mEq/L (ref 96–112)
GFR calc Af Amer: >60 mL/min (ref >60)
GFR calc non Af Amer: >60 mL/min (ref >60)
Potassium: 3.1 mEq/L — ABNORMAL LOW (ref 3.5–5.1)
Total Bilirubin: 0.5 mg/dL (ref 0.3–1.2)

## 2010-08-15 LAB — AFB CULTURE WITH SMEAR (NOT AT ARMC): Acid Fast Smear: NONE SEEN

## 2010-08-15 LAB — CRYPTOSPORIDIUM SMEAR, FECAL: Cryptosporidium Smear.: NONE SEEN

## 2010-08-15 LAB — BASIC METABOLIC PANEL
BUN: 3 mg/dL — ABNORMAL LOW (ref 6–23)
BUN: 4 mg/dL — ABNORMAL LOW (ref 6–23)
BUN: 4 mg/dL — ABNORMAL LOW (ref 6–23)
CO2: 26 mEq/L (ref 19–32)
Calcium: 8.4 mg/dL (ref 8.4–10.5)
Calcium: 8.4 mg/dL (ref 8.4–10.5)
Calcium: 8.5 mg/dL (ref 8.4–10.5)
Calcium: 8.7 mg/dL (ref 8.4–10.5)
Chloride: 108 mEq/L (ref 96–112)
Chloride: 109 mEq/L (ref 96–112)
Chloride: 110 mEq/L (ref 96–112)
Creatinine, Ser: 0.84 mg/dL (ref 0.4–1.5)
Creatinine, Ser: 0.85 mg/dL (ref 0.4–1.5)
Creatinine, Ser: 0.88 mg/dL (ref 0.4–1.5)
Creatinine, Ser: 0.94 mg/dL (ref 0.4–1.5)
GFR calc Af Amer: >60 mL/min (ref >60)
GFR calc Af Amer: >60 mL/min (ref >60)
GFR calc Af Amer: >60 mL/min (ref >60)
GFR calc non Af Amer: >60 mL/min (ref >60)
GFR calc non Af Amer: >60 mL/min (ref >60)
Glucose, Bld: 83 mg/dL (ref 70–99)
Sodium: 139 mEq/L (ref 135–145)
Sodium: 141 mEq/L (ref 135–145)

## 2010-08-15 LAB — HIV-1 GENOTYPR PLUS

## 2010-08-15 LAB — CLOSTRIDIUM DIFFICILE EIA

## 2010-08-15 LAB — CBC
Hemoglobin: 13.1 g/dL (ref 13.0–17.0)
Hemoglobin: 13.3 g/dL (ref 13.0–17.0)
MCHC: 32.7 g/dL (ref 30.0–36.0)
MCHC: 33.2 g/dL (ref 30.0–36.0)
MCV: 95.3 fL (ref 78.0–100.0)
MCV: 96.3 fL (ref 78.0–100.0)
Platelets: 174 10*3/uL (ref 150–400)
Platelets: 175 10*3/uL (ref 150–400)
Platelets: 180 10*3/uL (ref 150–400)
Platelets: 191 10*3/uL (ref 150–400)
RBC: 4 MIL/uL — ABNORMAL LOW (ref 4.22–5.81)
RBC: 4.14 MIL/uL — ABNORMAL LOW (ref 4.22–5.81)
RBC: 4.18 MIL/uL — ABNORMAL LOW (ref 4.22–5.81)
RDW: 12.1 % (ref 11.5–15.5)
WBC: 2.1 10*3/uL — ABNORMAL LOW (ref 4.0–10.5)
WBC: 2.1 10*3/uL — ABNORMAL LOW (ref 4.0–10.5)
WBC: 2.3 10*3/uL — ABNORMAL LOW (ref 4.0–10.5)
WBC: 4 10*3/uL (ref 4.0–10.5)

## 2010-08-15 LAB — HERPES SIMPLEX VIRUS CULTURE: Culture: NOT DETECTED

## 2010-08-15 LAB — URINALYSIS, ROUTINE W REFLEX MICROSCOPIC
Glucose, UA: NEGATIVE mg/dL
Hgb urine dipstick: NEGATIVE
Protein, ur: NEGATIVE mg/dL
Specific Gravity, Urine: 1.005 (ref 1.005–1.030)
Urobilinogen, UA: 1 mg/dL (ref 0.0–1.0)

## 2010-08-15 LAB — CMV (CYTOMEGALOVIRUS) DNA ULTRAQUANT, PCR

## 2010-08-15 LAB — PROTIME-INR
INR: 1.14 (ref 0.00–1.49)
Prothrombin Time: 14.5 seconds (ref 11.6–15.2)

## 2010-08-15 LAB — DIFFERENTIAL
Lymphocytes Relative: 10 % — ABNORMAL LOW (ref 12–46)
Lymphs Abs: 0.4 10*3/uL — ABNORMAL LOW (ref 0.7–4.0)
Monocytes Relative: 8 % (ref 3–12)

## 2010-08-15 LAB — MAGNESIUM: Magnesium: 2.1 mg/dL (ref 1.5–2.5)

## 2010-08-15 LAB — GIARDIA/CRYPTOSPORIDIUM SCREEN(EIA)
Cryptosporidium Screen (EIA): NEGATIVE
Giardia Screen - EIA: NEGATIVE

## 2010-08-15 LAB — LIPASE, BLOOD: Lipase: 17 U/L (ref 11–59)

## 2010-08-15 LAB — T-HELPER CELLS (CD4) COUNT (NOT AT ARMC)
CD4 % Helper T Cell: <1 % — ABNORMAL LOW (ref 33–55)
CD4 T Cell Abs: <10 uL — ABNORMAL LOW (ref 400–2700)

## 2010-08-15 LAB — HIV-1 RNA QUANT-NO REFLEX-BLD: HIV 1 RNA Quant: 67400 copies/mL — ABNORMAL HIGH (ref <48)

## 2010-08-15 LAB — CULTURE, BLOOD (ROUTINE X 2): Culture: NO GROWTH

## 2010-08-19 ENCOUNTER — Other Ambulatory Visit: Payer: Self-pay

## 2010-08-19 ENCOUNTER — Other Ambulatory Visit: Payer: Self-pay | Admitting: *Deleted

## 2010-08-19 DIAGNOSIS — B20 Human immunodeficiency virus [HIV] disease: Secondary | ICD-10-CM

## 2010-08-19 DIAGNOSIS — K056 Periodontal disease, unspecified: Secondary | ICD-10-CM

## 2010-08-19 MED ORDER — DARUNAVIR ETHANOLATE 400 MG PO TABS
800.0000 mg | ORAL_TABLET | Freq: Every day | ORAL | Status: DC
Start: 2010-08-19 — End: 2011-04-06

## 2010-08-19 MED ORDER — EMTRICITABINE-TENOFOVIR DF 200-300 MG PO TABS: 1.0000 | ORAL_TABLET | Freq: Every day | ORAL | Status: DC

## 2010-08-19 MED ORDER — CHLORHEXIDINE GLUCONATE 0.12 % MT SOLN: OROMUCOSAL | Status: DC

## 2010-08-19 MED ORDER — RITONAVIR 100 MG PO TABS: 100.0000 mg | ORAL_TABLET | Freq: Every day | ORAL | Status: DC

## 2010-08-20 ENCOUNTER — Other Ambulatory Visit: Payer: Self-pay | Admitting: *Deleted

## 2010-08-20 DIAGNOSIS — B20 Human immunodeficiency virus [HIV] disease: Secondary | ICD-10-CM

## 2010-08-20 DIAGNOSIS — G4700 Insomnia, unspecified: Secondary | ICD-10-CM

## 2010-08-20 MED ORDER — LORAZEPAM 1 MG PO TABS: 1.0000 mg | ORAL_TABLET | Freq: Every evening | ORAL | Status: AC | PRN

## 2010-08-20 MED ORDER — AZITHROMYCIN 600 MG PO TABS: 1200.0000 mg | ORAL_TABLET | ORAL | Status: DC

## 2010-08-20 MED ORDER — SULFAMETHOXAZOLE-TMP DS 800-160 MG PO TABS: 1.0000 | ORAL_TABLET | Freq: Every day | ORAL | Status: DC

## 2010-08-26 ENCOUNTER — Other Ambulatory Visit: Payer: Self-pay

## 2010-08-26 NOTE — Assessment & Plan Note (Signed)
Summary: 6wks f/u [mkj]   CC:  follow-up visit for labs.   Current Allergies: ! * PNEUMOVAX Review of Systems General:  Denies chills, fatigue, fever, loss of appetite, malaise, sleep disorder, sweats, weakness, and weight loss. Eyes:  Denies blurring, discharge, double vision, eye irritation, eye pain, halos, itching, light sensitivity, red eye, vision loss-1 eye, and vision loss-both eyes. ENT:  Denies decreased hearing, difficulty swallowing, ear discharge, earache, hoarseness, nasal congestion, nosebleeds, postnasal drainage, ringing in ears, sinus pressure, and sore throat. CV:  Denies bluish discoloration of lips or nails, chest pain or discomfort, difficulty breathing at night, difficulty breathing while lying down, fainting, fatigue, leg cramps with exertion, lightheadness, near fainting, palpitations, shortness of breath with exertion, swelling of feet, swelling of hands, and weight gain. Resp:  Denies chest discomfort, chest pain with inspiration, cough, coughing up blood, excessive snoring, hypersomnolence, morning headaches, pleuritic, shortness of breath, sputum productive, and wheezing. GI:  Denies abdominal pain, bloody stools, change in bowel habits, constipation, dark tarry stools, diarrhea, excessive appetite, gas, hemorrhoids, indigestion, loss of appetite, nausea, vomiting, vomiting blood, and yellowish skin color. GU:  Denies decreased libido, discharge, dysuria, erectile dysfunction, genital sores, hematuria, incontinence, nocturia, urinary frequency, and urinary hesitancy. MS:  Denies joint pain, joint redness, joint swelling, loss of strength, low back pain, mid back pain, muscle aches, muscle , cramps, muscle weakness, stiffness, and thoracic pain. Derm:  Denies changes in color of skin, changes in nail beds, dryness, excessive perspiration, flushing, hair loss, insect bite(s), itching, lesion(s), poor wound healing, and rash. Neuro:  Denies brief paralysis, difficulty with  concentration, disturbances in coordination, falling down, headaches, inability to speak, memory loss, numbness, poor balance, seizures, sensation of room spinning, tingling, tremors, visual disturbances, and weakness. Psych:  Denies alternate hallucination ( auditory/visual), anxiety, depression, easily angered, easily tearful, irritability, mental problems, panic attacks, sense of great danger, suicidal thoughts/plans, thoughts of violence, unusual visions or sounds, and thoughts /plans of harming others. Endo:  Denies cold intolerance, excessive hunger, excessive thirst, excessive urination, heat intolerance, polyuria, and weight change. Heme:  Denies abnormal bruising, bleeding, enlarge lymph nodes, fevers, pallor, and skin discoloration. Allergy:  Denies hives or rash, itching eyes, persistent infections, seasonal allergies, and sneezing. Exposures:  Denies HIV exposure, EBV exposure, TB exposure, exposure to sick animals, exposure to sick people, exposure to unusual animals, exposure to small children, exposure to caves/spelunking, exposure to bats, exposure to hunting/wild game, exposure to stagnant or pond water, exposure to salt water, exposure to marine animals/shellfish, animal bites, cat scratches, tick bites, eating raw eggs, eating raw chicken, eating raw fish/shellfish, blood transfusion, ingestion of well water, water vapor exposure, history of needle use/puncture, history of antibiotic use (last 2 mo.), and history of travel.  Vital Signs:  Patient profile:   30 year old male Height:      71 inches Weight:      128.50 pounds BMI:     17.99 Temp:     97.8 degrees F oral Pulse rate:   72 / minute BP sitting:   115 / 72  (right arm)  Vitals Entered By: Alesia Morin CMA (June 24, 2010 9:50 AM) CC: follow-up visit for labs Is Patient Diabetic? No Pain Assessment Patient in pain? no       Have you ever been in a relationship where you felt threatened, hurt or afraid?No   Does  patient need assistance? Functional Status Self care Ambulation Normal Comments pt has not missed any doses of meds  Physical Exam  General:  alert, well-developed, well-nourished, and well-hydrated.   Head:  normocephalic, atraumatic, no abnormalities observed, and no abnormalities palpated.   Eyes:  vision grossly intact, pupils equal, pupils round, and pupils reactive to light.   Ears:  R ear normal and L ear normal.   Nose:  no external deformity and no nasal discharge.   Mouth:  good dentition, no gingival abnormalities, no dental plaque, and pharynx pink and moist.   Neck:  supple, full ROM, no masses, no thyromegaly, and no thyroid nodules or tenderness.   Chest Wall:  no deformities, no tenderness, and no masses.   Lungs:  normal respiratory effort, no intercostal retractions, no accessory muscle use, and normal breath sounds.   Heart:  normal rate, regular rhythm, no murmur, no gallop, no rub, and no JVD.   Abdomen:  soft, non-tender, normal bowel sounds, no hepatomegaly, and no splenomegaly.   Rectal:  no external abnormalities, normal sphincter tone, no masses, no tenderness, and no perianal rash.   Genitalia:  uncircumcised, no hydrocele, no varicocele, no scrotal masses, no testicular masses or atrophy, no cutaneous lesions, and no urethral discharge.   Msk:  normal ROM, no joint tenderness, no joint swelling, no joint warmth, no redness over joints, no joint deformities, no joint instability, and no crepitation.   Pulses:  R and L carotid,radial,femoral,dorsalis pedis and posterior tibial pulses are full and equal bilaterally Extremities:  No clubbing, cyanosis, edema, or deformity noted with normal full range of motion of all joints.   Neurologic:  No cranial nerve deficits noted. Station and gait are normal. Plantar reflexes are down-going bilaterally. DTRs are symmetrical throughout. Sensory, motor and coordinative functions appear intact. Skin:  Intact without suspicious  lesions or rashes Cervical Nodes:  No lymphadenopathy noted Axillary Nodes:  No palpable lymphadenopathy Inguinal Nodes:  No significant adenopathy Psych:  Cognition and judgment appear intact. Alert and cooperative with normal attention span and concentration. No apparent delusions, illusions, hallucinations   Impression & Recommendations:  Problem # 1:  HIV DISEASE (ICD-042)  CPM, Labs 8 wks, f/u 10 wks His updated medication list for this problem includes:    Bactrim Ds 800-160 Mg Tabs (Sulfamethoxazole-trimethoprim) ..... One tab by mouth once daily    Azithromycin 600 Mg Tabs (Azithromycin) .Marland Kitchen... Take 2 tablets once a week    Fluconazole 150 Mg Tabs (Fluconazole) .Marland Kitchen... Take 1 tablet by mouth once a day for 15 days    Nystatin 100000 Unit/ml Susp (Nystatin) ..... Swallow 5 ml three times a day for 15 days  Orders: Est. Patient Level IV (99214)Future Orders: T-CBC w/Diff ([id number]) ... 08/26/2010 T-CD4SP ([facility]) (CD4SP) ... 08/26/2010 T-Comprehensive Metabolic Panel [id number]) ... 08/26/2010 T-HIV Viral Load [id number]) ... 08/26/2010 Prescriptions: LORAZEPAM 1 MG TABS (LORAZEPAM) 1 by mouth at bedtime as needed sleep  #30 x 2   Entered by:   Alesia Morin CMA   Authorized by:   Talmadge Chad NP   Signed by:   Talmadge Chad NP on 08/17/2010   Method used:   Telephoned to ...       [facility] (mail-order)       [address]       [place], [place]  [address]       Ph: [phone]       Fax: [phone]   RxID:   [id number]   Prevention & Chronic Care Immunizations   Influenza vaccine: Fluvax Non-MCR  (05/10/2010)    Tetanus booster: Not  documented    Pneumococcal vaccine: Pneumovax  (12/20/2005)  Other Screening   Smoking status: current  (05/10/2010)   Smoking cessation counseling: yes  (05/10/2010)

## 2010-08-26 NOTE — Progress Notes (Signed)
Summary: refill request  Phone Note Call from Patient   Caller: Patient Call For: Talmadge Chad NP Summary of Call: Pt called to see if we would give him a refill on his Tramadol adv will speak with the provider and see if he will refill it for him. He adv [facility] is [facility]. Alesia Morin CMA  August 04, 2010 3:42 PM Initial call taken by: Alesia Morin CMA,  August 04, 2010 3:42 PM

## 2010-09-01 LAB — T-HELPER CELLS (CD4) COUNT (NOT AT ARMC)
CD4 % Helper T Cell: 1 % — ABNORMAL LOW (ref 33–55)
CD4 T Cell Abs: <10 uL — ABNORMAL LOW (ref 400–2700)

## 2010-09-01 LAB — CULTURE, BLOOD (ROUTINE X 2): Culture: NO GROWTH

## 2010-09-01 LAB — DIFFERENTIAL
Basophils Relative: 0 % (ref 0–1)
Eosinophils Relative: 0 % (ref 0–5)
Lymphocytes Relative: 17 % (ref 12–46)
Lymphs Abs: 0.4 10*3/uL — ABNORMAL LOW (ref 0.7–4.0)
Monocytes Absolute: 0.3 10*3/uL (ref 0.1–1.0)
Monocytes Relative: 12 % (ref 3–12)
Monocytes Relative: 15 % — ABNORMAL HIGH (ref 3–12)
Neutro Abs: 1.7 10*3/uL (ref 1.7–7.7)
Neutrophils Relative %: 57 % (ref 43–77)
Neutrophils Relative %: 67 % (ref 43–77)

## 2010-09-01 LAB — CBC
MCHC: 34.2 g/dL (ref 30.0–36.0)
Platelets: 129 10*3/uL — ABNORMAL LOW (ref 150–400)
Platelets: 133 10*3/uL — ABNORMAL LOW (ref 150–400)
RBC: 4.05 MIL/uL — ABNORMAL LOW (ref 4.22–5.81)
RBC: 4.34 MIL/uL (ref 4.22–5.81)
RDW: 11.3 % — ABNORMAL LOW (ref 11.5–15.5)
WBC: 1.6 10*3/uL — ABNORMAL LOW (ref 4.0–10.5)
WBC: 2.5 10*3/uL — ABNORMAL LOW (ref 4.0–10.5)
WBC: 2.6 10*3/uL — ABNORMAL LOW (ref 4.0–10.5)
WBC: 2.7 10*3/uL — ABNORMAL LOW (ref 4.0–10.5)

## 2010-09-01 LAB — URINALYSIS, ROUTINE W REFLEX MICROSCOPIC
Bilirubin Urine: NEGATIVE
Protein, ur: NEGATIVE mg/dL
Urobilinogen, UA: 0.2 mg/dL (ref 0.0–1.0)

## 2010-09-01 LAB — WOUND CULTURE: Gram Stain: NONE SEEN

## 2010-09-01 LAB — HIV-1 RNA QUANT-NO REFLEX-BLD
HIV 1 RNA Quant: 200000 copies/mL — ABNORMAL HIGH (ref <48)
HIV-1 RNA Quant, Log: 5.3 {Log} — ABNORMAL HIGH (ref <1.68)

## 2010-09-01 LAB — BASIC METABOLIC PANEL
BUN: 8 mg/dL (ref 6–23)
Calcium: 8.3 mg/dL — ABNORMAL LOW (ref 8.4–10.5)
Calcium: 8.5 mg/dL (ref 8.4–10.5)
Creatinine, Ser: 0.74 mg/dL (ref 0.4–1.5)
Creatinine, Ser: 0.87 mg/dL (ref 0.4–1.5)
GFR calc Af Amer: >60 mL/min (ref >60)
GFR calc non Af Amer: >60 mL/min (ref >60)
GFR calc non Af Amer: >60 mL/min (ref >60)
Sodium: 142 mEq/L (ref 135–145)

## 2010-09-01 LAB — RSV(RESPIRATORY SYNCYTIAL VIRUS) AB, BLOOD: RSV Ab, IgG: 3.64 IV

## 2010-09-01 LAB — URINE CULTURE: Culture: NO GROWTH

## 2010-09-01 LAB — ANAEROBIC CULTURE

## 2010-09-01 LAB — HIV ANTIBODY (ROUTINE TESTING W REFLEX): HIV: REACTIVE — AB

## 2010-09-01 LAB — CULTURE, ROUTINE-ABSCESS

## 2010-09-01 LAB — EPSTEIN-BARR VIRUS VCA, IGG: EBV VCA IgG: >9.1 {ISR} — ABNORMAL HIGH

## 2010-09-01 LAB — HIV 1/2 CONFIRMATION
HIV-1 antibody: POSITIVE
HIV-2 Ab: UNDETERMINED

## 2010-09-01 LAB — MAGNESIUM: Magnesium: 1.9 mg/dL (ref 1.5–2.5)

## 2010-09-01 LAB — URINE MICROSCOPIC-ADD ON

## 2010-09-02 ENCOUNTER — Ambulatory Visit: Payer: Self-pay | Admitting: Adult Health

## 2010-09-10 ENCOUNTER — Other Ambulatory Visit: Payer: Self-pay | Admitting: *Deleted

## 2010-09-10 DIAGNOSIS — G629 Polyneuropathy, unspecified: Secondary | ICD-10-CM

## 2010-09-10 MED ORDER — TRAMADOL HCL 50 MG PO TABS: 50.0000 mg | ORAL_TABLET | ORAL | Status: DC | PRN

## 2010-09-13 ENCOUNTER — Other Ambulatory Visit: Payer: Self-pay

## 2010-09-13 LAB — T-HELPER CELL (CD4) - (RCID CLINIC ONLY): CD4 T Cell Abs: 20 uL — ABNORMAL LOW (ref 400–2700)

## 2010-09-14 LAB — POCT I-STAT, CHEM 8
Calcium, Ion: 1.1 mmol/L — ABNORMAL LOW (ref 1.12–1.32)
Chloride: 106 mEq/L (ref 96–112)
HCT: 48 % (ref 39.0–52.0)
Potassium: 3.5 mEq/L (ref 3.5–5.1)

## 2010-09-14 LAB — DIFFERENTIAL
Basophils Absolute: 0 10*3/uL (ref 0.0–0.1)
Basophils Relative: 0 % (ref 0–1)
Monocytes Absolute: 0.4 10*3/uL (ref 0.1–1.0)
Neutro Abs: 4.1 10*3/uL (ref 1.7–7.7)
Neutrophils Relative %: 87 % — ABNORMAL HIGH (ref 43–77)

## 2010-09-14 LAB — CULTURE, ROUTINE-ABSCESS

## 2010-09-14 LAB — CBC
MCHC: 34 g/dL (ref 30.0–36.0)
Platelets: 214 10*3/uL (ref 150–400)
RDW: 11.7 % (ref 11.5–15.5)

## 2010-09-29 ENCOUNTER — Other Ambulatory Visit: Payer: Self-pay

## 2010-10-05 ENCOUNTER — Ambulatory Visit: Payer: Self-pay | Admitting: Adult Health

## 2010-10-12 NOTE — Discharge Summary (Signed)
Stephen Griffin, Stephen Griffin                ACCOUNT NO.:  [id number]   MEDICAL RECORD NO.:  [id number]          PATIENT TYPE:  INP   LOCATION:  6732                         FACILITY:  [facility]   PHYSICIAN:  Ronda Fairly, M.D.    DATE OF BIRTH:  11/11/1980   DATE OF ADMISSION:  12/05/2006  DATE OF DISCHARGE:  12/08/2006                               DISCHARGE SUMMARY   Patient is leaving AMA (against medical advice).   DISCHARGE DIAGNOSES:  1. Presumed meningitis, etiology unknown.  2. Psoas abscess status post drainage.  3. Human immunodeficiency virus.  4. Depression.   DISCHARGE MEDICATIONS:  1. Atripla one tablet by mouth once daily.  2. Bactrim Double-Strength one tab Monday, Wednesday and Friday.  3. Azithromycin 1200 mg p.o. once weekly.  4. Doxycycline 100 mg p.o. b.i.d. x10 days.  5. Ciprofloxacin 500 mg p.o. b.i.d. x10 days.  6. Celexa 20 mg p.o. once daily.   DISPOSITION AND FOLLOWUP:  The patient has a followup appointment with  Dr. Philipp Deputy on December 12, 2006.  At that time, he will need to be  clinically assessed for his HIV.  He also had a drain put in for  reaccumulation of his psoas abscess.  This needs to be evaluated  clinically.  He has been sent home on doxycycline and ciprofloxacin.  His psoas abscess fluid has been sent for bacterial, fungal and  tubercular cultures.  These need to be followed up when he is seen at  the outpatient clinic.  Stephen Griffin also has an appointment for a repeat  CT scan of the abdomen on July 18 at 9:00 a.m.  He was seen by Dr. Lowella Dandy  during this admission and a drain was placed.  Patient will carefully  need to use sterile saline flushes to maintain the drain and will need  to immediately report to intervention radiology if he develops fever and  chills.  This CT scan is being done as part of a followup.  The patient  has also been scheduled for a followup appointment at the outpatient  clinic.  The patient will need to call for the exact date  and time.   CONSULTATIONS:  Dr. Lowella Dandy, intervention radiology.   IMPORTANT DIAGNOSTIC STUDIES:  1. A CT scan of the abdomen and pelvis with contrast shows a left      psoas fluid collection extending accordingly into the pelvis and to      the iliopsoas musculature.  Necrotic adenopathy is present.  The      size of the abscess is 57 x 35 mm.  2. CT scan of the head without contrast shows no abnormal findings.  3. Lumbar puncture analysis showed a glucose of 44, protein of 171, a      WBC count of 39, RBC count of 1135.  The WBCs were predominantly      lymphocytes 94% with 5% monocytes.  Gram-stain was negative.      Culture is pending.  CSF analysis for tuberculosis is also pending.   HISTORY AND PHYSICAL:  Stephen Griffin is a 30 year old African-American male  with  a past medical history of HIV with last CD4 count of 170 and viral  load of less than 15 in April of 2008, and a history of lower extremity  DVT in April of 2008 who has never taken Coumadin post discharge.  He  also has a history of a huge psoas abscess which was drained by  intervention radiology during the last visit.  He presented with a  history of headache, photophobia and mild neck rigidity.  He denies a  history of tick exposure.   ALLERGIES:  No known drug allergies.   PAST MEDICAL HISTORY:  1. HIV with CD4 count of 170 and viral load of less than 15 June of      2008.  2. History of left lower extremity DVT in April of 2008.  3. History of large psoas abscess in April of 2008 status post drain      placement.   MEDICATIONS:  1. Atripla one tab daily.  2. Bactrim Double-Strength one tab q.daily.  3. Azithromycin 1200 mg once weekly.   SUBSTANCE HISTORY:  He is a current smoker 1/2 pack per day, uses  marijuana occasionally.   SOCIAL HISTORY:  He has Medicaid.   FAMILY HISTORY:  Noncontributory.   VITAL SIGNS:  Temperature 99 with a T-max of 100.2, blood pressure  125/80, pulse 84, respiratory rate 20, O2  sats 99% on room air.   PERTINENT PHYSICAL EXAMINATION:  GENERAL:  Photophobia present.  Neck  stiffness absent.  ABDOMINAL EXAMINATION:  Diffusely tender to palpation, distended.  NEURO EXAMINATION:  Cranial nerves II-XII intact.  Motor 5/5 in all  limbs.  DTRs 2+ in all limbs.  Sensation intact.  Cerebellar sign is  intact.   Hemoglobin of 15.5, WBC 6.4, platelets 353, sodium 135, potassium 3.8,  chloride 103, bicarb 25, BUN 8, creatinine 0.96, glucose of 96, alk phos  of 138, SGOT 27, SGPT 30, protein 9.1, albumin 3.8, calcium 9.5.   CT scan of the head:  Negative.   Chest x-ray:  Negative.   PROBLEM:  1. Presumed meningitis.  Patient presented with headache and      photophobia.  He had a CT scan which was negative, a lumbar      puncture which was done showed a glucose of 44, total protein of      171, a white count of 39, predominantly lymphocytes, and an RBC      count of 1135.  Gram-stain was negative.  Cryptococcal antigen was      negative from the CSF as well as from the blood.  IgG was 2260, IgM      was 117.  His CSF for bacterial cultures, fungal cultures and      tuberculosis testing is still pending.  He was treated for a      presumed meningitis with ceftriaxone and doxycycline.  Patient's      headache improved clinically.  On the third day of discharge,      patient decided to leave AMA.  He was changed to doxycycline plus      ciprofloxacin.  Patient will need to be clinically followed up in      the outpatient clinic.  The exact etiology of the meningitis is      unclear.  This may well be tubercular meningitis versus aseptic      viral meningitis versus fungal meningitis.  Also important to      consider would be tick-borne illnesses like Rickettsia and  Borrelia.  The CSF had a normal glucose and a lymphocytic      predominance with a negative Gram-stain.  Different viral assays      from his CSF are still pending and need to be followed up as an       outpatient.  2. Psoas abscess.  Patient was noted to have a huge psoas abscess in      the past which was drained in April of 2008.  He complained of      abdominal pain and a repeat CT scan was obtained which revealed a      57 x 35 cm psoas abscess on the left side.  There was also      associated necrotic adenopathy.  Dr. Lowella Dandy from intervention      radiology was consulted.  A drain was placed.  The abscess fluid      was also taken for fungal, bacterial and tubercular cultures.  The      results of these cultures are still pending.  The patient was      discharged home on Cipro and doxycycline.  He is leaving AMA.      These cultures will need to be followed up in the clinic when he      sees Dr. Philipp Deputy.  His antibiotics may also be adjusted      accordingly.  3. HIV.  Patient was noted to have a CD4 count of 100.  He will      continue with Atripla.  He will also continue his Bactrim and      azithromycin for prophylaxis.  He will need to be followed in the      HIV clinic.  4. Depression.  Patient was noted to be severely depressed during this      admission.  He admitted to anhedonia, loss of appetite and loss of      sleep.  He was started on Celexa 20 mg daily for depression.  This      will need to be monitored in the clinic.  5. History of DVT.  The patient was noted to have a DVT in the common      and femoral veins on a Doppler study done in April of 2008.  This      was also noted on CT scan done on September 03, 2006.  The patient has      not taken his Coumadin and he refuses to take Coumadin even at this      point.   DISCHARGE VITALS AND LABS:  At the time of discharge, his blood pressure  was 115/76, temperature 98.4, pulse 77, respiratory rate 20, O2 sats  100% on room air.   His sodium was 136, potassium 3.5, chloride 103, bicarb 27, glucose 96,  creatinine 1.01.  His hemoglobin was 13.7.   The patient is leaving AMA.   Dictation ended at this point.       Ronda Fairly, M.D.  Electronically Signed     YB/MEDQ  D:  12/08/2006  T:  12/09/2006  Job:  [id number]

## 2010-10-13 ENCOUNTER — Ambulatory Visit: Payer: Self-pay | Admitting: Adult Health

## 2010-10-15 NOTE — Consult Note (Signed)
NAMEMARVELOUS, BOUWENS                ACCOUNT NO.:  [id number]   MEDICAL RECORD NO.:  [id number]          PATIENT TYPE:  INP   LOCATION:  5712                         FACILITY:  [facility]   PHYSICIAN:  Ollen Gross. Vernell Morgans, M.D. DATE OF BIRTH:  1981-05-17   DATE OF CONSULTATION:  DATE OF DISCHARGE:                                 CONSULTATION   HISTORY:  Mr. Aldea is a 30 year old black male with AIDS who presents  with a painful swollen area in his left groin.  He states the area has  been there for about three weeks.  It has been draining.  He denies any  fevers.  He denies any pain anywhere else.   PAST MEDICAL HISTORY:  Significant for:  1. AIDS.  2. Hypertension.  3. Asthma.  4. Diabetes.   PAST SURGICAL HISTORY:  None.   MEDICATIONS:  Include Atripla, Bactrim, erythromycin, albuterol.   ALLERGIES:  NO KNOWN DRUG ALLERGIES.   SOCIAL HISTORY:  He drinks about a six-pack every weekend, smokes a pack  of cigarettes a day for the last three to four years.   FAMILY HISTORY:  Noncontributory.   PHYSICAL EXAMINATION:  VITAL SIGNS:  He is afebrile with stable vitals.  GENERAL:  He is a young black male in no acute distress.  SKIN:  Warm and dry.  No jaundice.  EYES:  Extraocular muscles are intact.  Pupils equal, round and reactive  to light.  Sclerae are nonicteric.  LUNGS:  Clear bilaterally with no use of excessive respiratory muscles.  HEART:  Regular rate and rhythm with an impulse in the left chest.  ABDOMEN:  Soft and nontender, no palpable mass or hepatosplenomegaly.  EXTREMITIES:  No cyanosis, clubbing or edema with good strength in his  arms and legs.  In his left groin, he has about a 4- to 5-cm firm  indurated area, in the middle of this is an open wound, it has some very  mild drainage at the moment, but at the skin is opened about 2 cm and  goes deeper into the indurated area.   On review of his labs, his white count was normal.   ASSESSMENT/PLAN:  This is a  30 year old black male with AIDS and a left  groin abscess.  It appears as though this area is completely open and  draining.  I would agree with IV antibiotics, would start packing the  open wound.  I would also recommend getting a CT scan to look for any  undrained abscess deeper in the groin and thigh area and will follow him  along with you.      Ollen Gross. Vernell Morgans, M.D.  Electronically Signed     PST/MEDQ  D:  03/29/2006  T:  03/30/2006  Job:  [id number]

## 2010-10-15 NOTE — Discharge Summary (Signed)
Stephen Griffin, Stephen Griffin                ACCOUNT NO.:  [id number]   MEDICAL RECORD NO.:  [id number]          PATIENT TYPE:  INP   LOCATION:  3028                         FACILITY:  [facility]   PHYSICIAN:  Kela Millin, M.D.DATE OF BIRTH:  [birth year]   DATE OF ADMISSION:  11/19/2005  DATE OF DISCHARGE:  11/24/2005                                 DISCHARGE SUMMARY   DISCHARGE DIAGNOSIS:  1.  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  2.  Oral Candidiasis/flush.  3.  Acquired immunodeficiency syndrome.   CONSULTATIONS:  Infectious disease, Dr. Roxan Hockey.   HISTORY:  The patient is a 30 year old black male with no significant past  medical history who presented with complaints of cough and weakness.  He  stated that he had been coughing for one week, that it started out as a dry  cough and then become productive of white sputum.  He denied hemoptysis.  He  was also continued with shortness of breath intermittently as well as  pleuritic chest pain.  The patient denies fevers but was noted that he had  elevated temperatures in the ER up to 101.3.   PHYSICAL EXAMINATION:  His examination upon admission as per Dr. Arthor Captain  revealed a temperature of 101.3 with a blood pressure of 127/82, pulse 103,  respiratory rate 18, O2 saturation 94%.  Pertinent findings were on HEENT  where she was noted to have findings consistent with oral thrush on exam.  On his lung exam, no accessory muscle use, no rhonchi, no wheezes, no  crackles.  The rest of his physical exam was noted to be within normal  limits.   LABORATORY DATA:  His white cell count was 1.3, hemoglobin 14.2, hematocrit  43.3, MCV 94.2, platelets 408.  Sodium 136, potassium 3.6, chloride 103, CO2  25, glucose 99, BUN 5, creatinine 0.8, calcium 8.4.  ALP 30, ALT 12,  alkaline phos 73, LDH 387.  The initial chest x-ray was read as no acute  disease, although a repeat chest x-ray on the same day showed a patchy right  mid and lower lung zone air  space disease with a single nodular opacity on  the left.   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  The patient also had an HIV test  done.  The patient's HIV screening test was reactive and a confirmatory test  was positive - HIV-1.  HIV-2 antibodies were negative.  Infectious disease  was consulted and Dr. Roxan Hockey saw the patient and recommended starting the  patient on PCP treatment - Bactrim, and prednisone was also added.  Following this intervention, the patient improved clinically - he had  defervesced and was oxygenating well on room air.  His exercise pulse ox was  checked and it was 95% on room air.  Infectious disease followed the patient  for the rest of his hospital stay and he has remained hemodynamically stable  and afebrile, he is tolerating a p.o. diet well.  Dr.  Roxan Hockey saw the  patient on rounds today and recommended that the patient be discharged home  and I agree.  Dr. Roxan Hockey has arranged for the patient to follow up with  Dr. Philipp Deputy at the [facility] on July 10 at 2 p.m.  The  patient states that he has no way of getting his medications and, so, he has  been referred to [facility] for assistance with his medications prior to  his July 10 appointment.   Problem 2:  Thrush - the patient was started on Diflucan upon admission.  His symptoms have improved and he will be discharged on oral Diflucan for  ten more days.   Problem 3:  Acquired immunodeficiency syndrome - as discussed above, the  patient had HIV test as well as a CD4 count and the results are as stated  above.  He was followed by infectious disease, Dr. Roxan Hockey, during his  hospital stay and he is to follow up as arranged at the [facility] on July 10.  The patient had a hepatitis panel done during  his  hospital stay and it was negative.  Highly active anti-retroviral  therapy for this patient is to be started later on as per Dr. Roxan Hockey.   DISCHARGE MEDICATIONS:  1.  Bactrim DS 2 tablets p.o. q.8h. for three weeks and then continue on one      tablet daily.  2.  Prednisone 20 mg 2 tablets b.i.d. for five days and then change to 2      tablets daily for five days and then change to 1 tablet daily for 11      days, and then stop.  3.  Diflucan 100 mg 1 tablet daily for ten days.  4.  Prilosec 20 mg 2 tablets daily.   FOLLOW UP:  Dr. Philipp Deputy at the [facility] as scheduled on  July 10 at 2 p.m.   CONDITION ON DISCHARGE:  Improved/stable.      Kela Millin, M.D.  Electronically Signed     ACV/MEDQ  D:  11/24/2005  T:  11/24/2005  Job:  [id number]   cc:   Rockey Situ. Flavia Shipper., M.D.  Fax: [phone]   [facility]

## 2010-10-15 NOTE — H&P (Signed)
[name], Stephen Griffin NO.:  [id number]   MEDICAL RECORD NO.:  [id number]          PATIENT TYPE:  INP   LOCATION:  3028                         FACILITY:  [facility]   PHYSICIAN:  Stephen Gardener, MD    DATE OF BIRTH:  April 11, 1981   DATE OF ADMISSION:  11/20/2005  DATE OF DISCHARGE:                                HISTORY & PHYSICAL   PRIMARY PHYSICIAN:  Patient does not have any physician and will admitted as  unassigned.   CHIEF COMPLAINT:  Cough and weakness.   HISTORY OF PRESENT ILLNESS:  This is a 30 year old African-American male  with no significant past medical history who presented with the above-  mentioned complaint.  She stated that she has been coughing for the last 1  week, this started dry and then became productive with white sputum.  She  denied any hemoptysis.  Was also complaining of shortness of breath on and  off and pleuritic chest pain that increases when she coughs.  Denied any  fever but her temperature was high in the ER and she was also complaining of  increasing weakness.  In the ER, her vitals:  Temperature is 101.3, blood  pressure is 127/82, heart rate is 103, respiratory rate is 18.   PAST MEDICAL HISTORY:  Denied.   PAST SURGICAL HISTORY:  Denied.   ALLERGIES:  NO KNOWN DRUG ALLERGIES.   MEDICATIONS:  Denied.   SOCIAL HISTORY:  The patient stated that she did not smoke and does not  drink and does not use any recreational drugs.   SEXUAL HISTORY:  She said that she had only one sexual partner, which is  male, and sometimes she might act as transsexual.   FAMILY HISTORY:  She denies hypertension, no hypercholesterolemia, no  history of coronary artery disease and no diabetes.   REVIEW OF SYSTEMS:  Is positive for cough, white sputum, shortness of  breath, pleuritic chest pain and oral thrush.  Other systems:  CONSTITUTIONAL:  There is positive fever and positive fatigability, there  are no weight changes.  ENT:  No  difficulty eating, no difficulty  swallowing, no epistaxis.  EYES:  No blurred vision, no pain, no redness.  CARDIOVASCULAR:  Positive for chest pain but these are pleuritic.  Thee is  no palpitations, no syncope.  RESPIRATORY:  Is positive for shortness of  breath, cough and sputum.  There is no hemoptysis and no wheeze.  GI:  No  nausea, no vomiting, no abdominal pain.  GU:  No dysuria, no hematuria and  no frequency.  ID:  No rash, no lesions.  HEMATOLOGY:  No bruises, no  bleeding.  NEUROLOGY:  No numbness, no tingling, no weakness and no tremors.  The rest of the systems were reviewed and they were negative.   PHYSICAL EXAMINATION:  VITAL SIGNS:  Temperature is 101.3, blood pressure is  127/82, pulse is 103, respiratory rate is 18, pulse ox. is 94.  GENERAL APPEARANCE:  This is a young African-American male who is laying  down in bed, not in acute distress at the present time.  HEENT:  Conjunctiva is normal, there is no pallor, no erythema.  Pupils are  equal and reactive to light and accommodation, there is no ptosis.  Hearing  is intact.  There is no ear discharge or infection.  There is no oral  discharge or posterior bleeding .  Oral mucosa is moist and there is no  pharyngeal erythema but there is oral thrush.  CARDIOVASCULAR EXAMINATION:  S1 and S2 were heard, there is no additional  heart sounds, there is no murmurs, no gallops and no thrills.  NECK EXAMINATION:  Neck is supple, no JVD, no carotid bruit, no  lymphadenopathy.  No thyroid enlargement or thyroid tenderness.  RESPIRATORY EXAMINATION:  The patient's breathing between 18-20, there is no  use of accessory muscles.  No intercostal retractions.  No dullness, no  rales, no rhonchi and no wheezes.  ABDOMINAL EXAMINATION:  The abdomen is soft, not distended.  No tenderness,  no hepatosplenomegaly.  Bowel sounds are normal, umbilicus central.  LOWER EXTREMITIES:  No edema, no rash and no varicose veins.  SKIN:  No rash,  no erythema and the skin is warm to touch.  NEUROLOGICAL EXAMINATION:  Cranial nerves are intact II - XII .  There is no  motor or sensory deficits.  PSYCH EXAMINATION:  Patient is alert, awake and oriented x3, no recent or  remote memory impairment, and patient is cooperative, assisted in taking of  physical examination.   LAB RESULTS:  WBC is 1.3, hemoglobin 14.2, hematocrit 43.3, MCV is 94.2,  platelet count is 408.  Sodium 136, potassium 3.6, chloride 103, bicarbonate  25, glucose 99, BUN 5, creatinine 0.8, calcium 8.4, AST 30, ALP 12, alkaline  phosphatase is 73.  LDH is 387.  Chest x-ray officially read by the  radiologist as negative x-ray.   IMPRESSION AND ASSESSMENT:  1. Systemic inflammatory response syndrome.  This patient will fit in the      category of systemic inflammatory response syndrome because of her      fever and her leukocytosis.  I will start her on Avelox IV.  I will      send for sputum and blood culture and will put her on Tylenol p.r.n.  2. Acute bronchitis.  Her chest x-ray was negative for pneumonia.      Management will be as #1.  3. Oral candidiasis.  This patient has symptoms that might be suggestive      of human immunodeficiency virus.  The patient lost some weight      recently.  She is transsexual and her exam is positive for oral thrush.      I think that this patient might have human immunodeficiency virus and      probably will discuss with her the possibility of testing her for human      immunodeficiency virus if she agreed.  For her oral thrush, I will put      her on Diflucan, will start with 200 mg IV p.o. x1 and then we will put      her in 100 mg once daily for 2 weeks.  I will also repeat her chest x-      ray to rule out any possibility of pneumocystis carinii pneumonia and,      if needed, we might consider bronchoscopy if her symptoms worsen.   TOTAL ASSESSMENT TIME:  45 minutes.      Stephen Gardener, MD Electronically  Signed     NAE/MEDQ  D:  11/20/2005  T:  11/20/2005  Job:  [id number]

## 2010-10-15 NOTE — Consult Note (Signed)
NAMEGENTRY, PILSON NO.:  [id number]   MEDICAL RECORD NO.:  [id number]          PATIENT TYPE:  INP   LOCATION:  6715                         FACILITY:  [facility]   PHYSICIAN:  Candise Bowens, P.A. studentDATE OF BIRTH:  29-Nov-1980   DATE OF CONSULTATION:  DATE OF DISCHARGE:  08/30/2006                                 CONSULTATION   SURGICAL SERVICE:  No complaints of pain.  Tolerating diet well.  Resting quietly with eyes  closed.  No acute distress.  Arouses easily.  Alert and oriented x3.  Left lower abdomen and left flank with decreased edema and tenderness.  Remains firm.  Draining moderate amount of serosanguineous fluid.  Bowel  sounds are present throughout.  Left pedal pulse palpable.  Left leg  without edema, tenderness, or erythema.   VITAL SIGNS:  97.8 temperature.  Heart rate is 92.  Respirations is 18,  and blood pressure is 93/60.  O2 sat is 99% on room air.   Coagulation labs are normal.  CBC:  Hemoglobin 12.4, hematocrit 37.3,  white count is 7.4, and platelets are 757.  Culture results are negative  for growth.   ASSESSMENT:  1. Intraabdominal psoas abscess, status post drain insertion.  2. Left lower extremity deep venous thrombosis.  3. Active human immunodeficiency virus disease.  4. Asthma.   PLAN:  Continue drainage from abscess.  Continue Bactrim DS daily.  Continue IV Zosyn.  Continue treatment of deep vein thrombosis, left  lower extremity with Coumadin 7.5 mg daily and heparin drip at 1800  units/hour.  Needs followup CT scan to assess wound drainage.           ______________________________  Candise Bowens, P.A. student     RS/MEDQ  D:  09/07/2006  T:  09/07/2006  Job:  [id number]

## 2010-10-15 NOTE — Discharge Summary (Signed)
Stephen Griffin, Stephen Griffin                ACCOUNT NO.:  [id number]   MEDICAL RECORD NO.:  [id number]          PATIENT TYPE:  INP   LOCATION:  5712                         FACILITY:  [facility]   PHYSICIAN:  Stephen Griffin, M.D.     DATE OF BIRTH:  1980-12-29   DATE OF ADMISSION:  03/29/2006  DATE OF DISCHARGE:  04/01/2006                               DISCHARGE SUMMARY   DISCHARGE DIAGNOSES:  1. Left thigh abscess.  2. Immune reconstitution inflammatory syndrome.  3. Acquired immunodeficiency syndrome.   DISCHARGE MEDICATIONS:  1. Atripla 1 tablet by mouth once daily.  2. Bactrim DS 1 ne tablet by mouth once daily.  3. Augmentin 875 mg by mouth twice daily for total of 12 additional      days.   DISPOSITION AND FOLLOWUP:  Followup will be with Dr. Philipp Deputy at [facility] on April 12, 2006 at 11:30 in the morning.  He  should be informed of the CD-4 counts, which has risen from a low of 9  to 80, and current viral load is now less than 50, HIV log less than  1.70.   Consultations provided by Dr. Ollen Gross. Carolynne Edouard, General Surgery, and from  Dr. Lenn Sink, of Infectious Disease.   BRIEF ADMITTING HISTORY:  Stephen Griffin is a 30 year old transgender  male with a history of HIV who presented with a left thigh abscess which  began draining purulent material after undergoing an incision and  drainage by the ED physician at [facility] on oct. 11.  The abscess  started as a tender discolored area near his left groin which, over the  period of several weeks, began to enlarge.  After the I & D by the EDP,  he took the prescribed rifampin and Bactrim and returned 2 weeks later  with no significant improvement  He went again on March 22, 2006 for  skin incision and drainage, and he was given again Bactrim and rifampin,  but he continued to work.  The abscess drainiage became more copius and  foul smelling and he was admitted after his third presentation to the  ED.  He  denies fevers, chills.   PHYSICAL EXAMINATION:  VITAL SIGNS:  Temperature was 98.1, blood  pressure 122/72, pulse 97, respiratory rate 18, oxygen saturation 99% on  room air with 142 pounds.  GENERAL:  He is in no apparent distress.  SKIN:  Essentially normal except for large indurated openly draining  wound that was about 3 inches in diameter.   LABS:  Hemoglobin 13.7, hematocrit 40.4, platelets 559,000, white blood  cell count 9.4.  Sodium 136, potassium 4, chloride 103, bicarbonate 27,  BUN 2, creatinine 0.8, glucose 92, calcium 9.4.   For more detailed history and physical, please refer to the chart.   HOSPITAL COURSE:  1. Left thigh abscess with superior and lateral mass.  Patient      underwent evaluation of soft tissue absecess with CT scan which      revealed singificant adenopathy versus mass adjacent to the      abscess.  The  abscess was drained twice by surgeons.  He was      treated empirically, pending cultures, with Vancomycin and Zosyn,      given his failure to respond to I&D, rifampin and Bactrim prior to      admission.  Culutres failed to isolate a pathogen and one day      before discharge he was switched p.o. Augmentin, which he was able      to tolerate. .  We are going to just keep him on the Rocephin for      some time, advised him to keep wound clean.  We are having home      health come to clean the wound out and we will have him follow up      with Dr. Philipp Deputy on the 14th to see how it has been resolving.  2. Inguinal mass.  The CT findings were attributed to adenopathy      secondary to immune reconstitution inflammatory syndrome given his      recent improved CD4 count on HAART therapy.  This will be followed      up as an outpatient with a repeat CT scan in the future per      Infectious Disease consult.   1. HIV;  Patient's CD4 count upon admission was higher due to recent      use of Atripla for HAART.  Although he had self discontinued the       azithromycin for MAC prophylaxis due to intolerance secondary to      itching, his CD4 count is now above 50, so it is no longer      necessary.  However his Bactrim should continue for PCP      prophylaxis.  Viral load was less than 50.   DISCHARGE LABS AND VITALS:  Temperature 99.4, pulse 97, respirations 16,  blood pressure 101/51, oxygen saturation 99% on room air.  White blood  cell count 7.9, hemoglobin 12.8, hematocrit 13.7, platelet count  542,000.  Sodium 137, potassium 3.9, chloride 107, bicarbonate 24,  glucose 96, BUN 7, creatinine 0.9, calcium 9.1.      Stephen Griffin, M.D.  Electronically Signed      Stephen Griffin, M.D.  Electronically Signed    JC/MEDQ  D:  04/04/2006  T:  04/05/2006  Job:  [id number]   cc:   Tresa Endo L. Philipp Deputy, M.D.

## 2010-10-15 NOTE — Consult Note (Signed)
NAMEALLENMICHAEL, MCPARTLIN                ACCOUNT NO.:  [id number]   MEDICAL RECORD NO.:  [id number]          PATIENT TYPE:  INP   LOCATION:  3313                         FACILITY:  [facility]   PHYSICIAN:  Revonda Standard L. Rennis Harding, N.P. DATE OF BIRTH:  1980/07/15   DATE OF CONSULTATION:  09/04/2006  DATE OF DISCHARGE:                                 CONSULTATION   DICTATOR:  Sharlee Blew, P.A. Student, [employer]   SURGICAL CONSULTATION   REQUESTING PHYSICIAN:  Hollace Hayward, M.D., Teaching Service.   CONSULTING SURGEON:  Dr. Janee Morn.   REASON FOR CONSULTATION:  A left abdominal abscess.   HISTORY OF PRESENT ILLNESS:  The patient had some abscesses to the left  groin approximately three months ago.  She had this incision and drained  in the ER, and they drained and healed well.  He began having left lower  quadrant pain last week, presented to the [facility] Emergency  Department on Sunday morning with the same.  He was transferred here  from [facility].   ALLERGIES:  No known allergies.   FAMILY HISTORY:  Father with diabetes and hypertension.  Mother is  healthy.   SOCIAL HISTORY:  The patient states that he smokes approximately one-  half pack of cigarettes a day and that he consumes approximately five  beers two times a week.  He denies any recent drug abuse; however, there  is a history of former drug abuse.   PAST MEDICAL HISTORY:  1. Asthma.  2. HIV disease.   HOME MEDICATIONS:  1. Atripla 1 p.o. daily.  2. Elavil q.h.s.  3. Septra DS 1 daily.  4. The patient was supposed to be on a weekly regimen of Zithromax,      but he was not taking that due to his side effects.   REVIEW OF SYSTEMS:  Negative for fever, chills, and weight loss.  Positive for diaphoresis and night sweats.  Negative for chest pain.  Negative for dyspnea, cough, or hemoptysis.  Negative for wheezing.  Negative for dysuria or hematuria.  Negative for hair loss, rash, or  itching.  Negative  for heat or cold intolerance.  Negative for polyuria,  positive for polydipsia.  Negative for joint pain or trauma.  Negative  for headache, diplopia, blurred vision, or any visual changes.  Negative  for hearing loss, dizziness, or tinnitus.  Negative for depression,  anxiety, or suicidal thoughts.   PHYSICAL EXAMINATION:  The patient is a cachectic black male in no acute  distress.  He is a poor historian.  He is alert and oriented x3.  Lung sounds are clear throughout.  Heart sounds are within normal limits.  Bowel sounds present in all four quadrants.  Abdomen is distended on the  left, tender to left lower quadrant and left flank area palpation.  Both  areas are very firm.  Moves all four extremities well.   ASSESSMENT:  1. Active Human immunodeficiency virus disease.  2. Intra-abdominal psoas abscess.  3. Left lower extremity deep venous thrombosis suspected.  4. Asthma.   PLAN:  1. CT-guided drain  percutaneous with panculture and followup culture      results.  2. See Internal Medicine orders.  3. Continue present antibiotics until culture returns.  4. Deep venous thrombosis followup pending lower extremity Doppler      ultrasound results.  5. Any additional recommendations per Dr. Janee Morn.      Allison L. Rennis Harding, N.P.     ALE/MEDQ  D:  09/04/2006  T:  09/04/2006  Job:  [id number]

## 2010-10-15 NOTE — Discharge Summary (Signed)
NAMEDECODA, VAN NO.:  [id number]   MEDICAL RECORD NO.:  [id number]          PATIENT TYPE:  INP   LOCATION:  6715                         FACILITY:  [facility]   PHYSICIAN:  Alvester Morin, M.D.  DATE OF BIRTH:  July 12, 1980   DATE OF ADMISSION:  09/03/2006  DATE OF DISCHARGE:  09/11/2006                               DISCHARGE SUMMARY   DISCHARGE DIAGNOSES:  1. Psoas abscess, status post drainage.  2. Deep venous thrombosis to the left lower extremity.  3. Human immunodeficiency virus-positive.  4. Tachycardia.  5. Depression.   DISCHARGE MEDICATIONS:  1. Elavil 25 mg p.o. nightly.  2. Augmentin 875 mg one p.o. b.i.d. for 14 days.  3. Atripla one pill p.o. nightly.  4. Septra one p.o. nightly.  5. Coumadin 5 mg one pill p.o. daily until appointment with Dr. Michaell Cowing      in the outpatient clinic.  6. Vicodin 5/500 mg one p.o. q.8 h. p.r.n. pain.   DISPOSITION AND FOLLOWUP:  The patient has been scheduled to see Dr.  Alexandria Lodge in the outpatient clinic to monitor his Coumadin on September 14, 2006  at 3 p.m.  She has also been scheduled to see Dr. Elvera Lennox in the  outpatient clinic on September 18, 2006 at 11:15 to check his general well-  being.  The patient is also scheduled to have a CT of the abdomen and  pelvis on September 15, 2006 at noon to follow up his abscess.  The patient  also has an appointment with Dr. Lindie Spruce and Dr. Janee Morn with [facility] on Wednesday, April 23, at 10 a.m. for followup for his  drainage.  The patient also has an appointment with Dr. Philipp Deputy in the  outpatient clinic on September 20, 2006 at 11 a.m. to follow up his HIV  infection.   PROCEDURES PERFORMED:  An acute abdominal series on September 03, 2006 which  showed:  (1) No acute cardiopulmonary disease, (2) nonobstructive bowel  gas pattern, (3) hepatomegaly is suspected.   The patient also had a CT of the abdomen and pelvis on September 03, 2006.  Abdomen showed a 24-cm cystic mass with  its epicenter in the left psoas  muscle associated with necrotic retroperitoneal adenopathy.  Considerations would include both neoplastic and inflammatory etiology.  Given the prominence of the findings, correlation with the new stats is  warranted.  In the immunocompromised patient, considerations would  include bacterial adenitis, mycobacterial sepsis or fungal disease.  CT  of the pelvis showed:  (1) Necrotic adenopathy associated with a left  iliopsoas complex, fluid collection in the left side of the pelvis and  left groin.  These findings are highly worrisome for a mycobacterial  infection.  Also consider other opportunistic infections and lymphoma.  (2) Findings worrisome for left common femoral vein deep venous  thrombosis.  This can be confirmed with Doppler ultrasound.  The patient  is also status post drainage of his abscess with insertion of a drain,  which was on September 04, 2006.   The patient also had repeat CT of the abdomen  and pelvis on September 08, 2006 which showed:  Abdomen:  constipation.  Significant decrease in the  size of the left psoas abscess versus previous study.  Persistent  visualization of either additional abscess collections, infected lymph  nodes, or necrotic lymph nodes in left retroperitoneum as above, largest  multiloculated collection measuring 8 cm in greatest size.  CT of the  pelvis:  Showed improving psoas abscess, though additional infected or  necrotic lymph nodes are seen along the left iliac chain as far  inferiorly as the left acetabulum.  Similar to previous study, I could  not exclude deep venous thrombosis in the left common femoral vein.   The patient also had venous Dopplers on September 04, 2006 which showed a  deep venous thrombosis in the common femoral veins in the left.  The  blood flow in the profunda vein appears retrograde.  No evidence of  superficial thrombosis or Baker's cyst noted.  Bilateral enlarged  inguinal lymph nodes are  visualized.   CONSULTATIONS:  1. DrLindie Spruce and Dr. Janee Morn of [facility].  2. Dr. Alexandria Lodge in the outpatient clinic for Coumadin Management.   BRIEF HISTORY AND PHYSICAL:   CHIEF COMPLAINT:  Abdominal pain.   HISTORY OF PRESENT ILLNESS:  The patient is a 30 year old male with past  medical history significant for 042-positive, which was diagnosed in  June 2007 with the last CD4 count of 110 in January 2008 and asthma,  presenting to the ED secondary to abdominal pain.  His symptoms are  constant, but not associated with abdominal distention.  The patient is  complaining of minimal throbbing abdominal pain, 5/10, in the left lower  quadrant.  The patient reports that symptoms started approximately 1  week ago and that his pain has increased a little bit and has been  constant.  He denies any radiation of the pain.  The patient has no  other complaints.   PHYSICAL EXAMINATION:  VITAL SIGNS:  Temperature 98.1, blood pressure  118/69, pulse of 93, respiratory rate 16, O2 SATs 99% on room air.  GENERAL:  The patient is in no acute distress, resting comfortably and  thin.  EYES:  PERRLA.  ENT:  Moist oral mucosa.  No erythema.  NECK:  Supple.  No carotid bruits.  RESPIRATORY:  Lungs are clear to  auscultation bilaterally.  CV:  S1, S2, tachycardic, regular, no  murmurs, rubs, or gallops.  GI:  Soft, left lower quadrant mass firm and  tender with voluntary guarding, positive bowel sounds, no drainage noted  from the abscess, Murphy's sign negative.  EXTREMITIES:  Left lower  extremity has increased swelling and erythema in the anterior thigh  region.  SKIN:  Small wounds over the left lower extremity thigh region,  no abnormal visible lesions.  MUSCULOSKELETAL:  Normal range of motion.  NEUROLOGIC:  The patient is alert and oriented x3.  Cranial nerves II-  XII are grossly intact.  Motor is intact.  Cerebellar function is intact and sensation is intact.  PSYCHIATRIC:   Appropriate.   INITIAL LABORATORY DATA:  Sodium 136, potassium 3.9, chloride 100,  bicarb 29, BUN 5, creatinine 0.8, glucose 80, anion gap 7, bilirubin  0.3, alkaline phosphatase 134, AST 30, ALT 33, protein 10.0, albumin  3.3, calcium 9.6.  WBC 6.6, hemoglobin 13.7, platelets 680,000, ANC 4.7.  CD4 count was 110, which was in January 2008.  UA was negative.   HOSPITAL COURSE:  PROBLEM #1 - PSOAS ABSCESS, STATUS POST DRAINAGE:  The  patient was admitted to the stepdown unit.  General Surgery and  Interventional Radiology were consulted for drainage of the large left  lower quadrant and pelvic abscess.  The patient underwent drainage with  no complications.  The patient was given Vicodin and Phenergan for  symptom management.  The patient was continued on his Atripla as well as  his Septra for prophylaxis, given his low CD4 count.  Subsequent  cultures were taken of the fluid; anaerobic as well as aerobic cultures  failed to grow out any organisms.  AFB smear of the fluid was also  negative.  Fungal cultures as well as AFB cultures are still pending,  but no fungus was noted on the smear.  Blood cultures were negative x2.  At the time of hospitalization, no additional antibiotics were started  secondary to not knowing what organism was responsible, if any, for this  abscess, as well as poor penetration of antibiotics, given the abscess  itself.  Infectious Disease was consulted.  The patient was initially  put on Zosyn for broad anaerobic coverage; this was later transitioned  to Augmentin 875 mg p.o. b.i.d., which will be given for 14 days.  The  patient remained comfortable throughout the hospitalization with very  little pain.  He continued to have yellowish serosanguineous drainage  throughout hospitalization.  As a result, it was decided to sent the  patient home with his drain; the patient was instructed how to take care  of it and clean it prior to discharge.  The patient is to  follow up with  General Surgery on September 20, 2006, at which time it will be seen if the  drain can come out.  All cultures are currently still pending or showing  no growth at this time on September 15, 2006.  For further details, please  see hospital chart.   PROBLEM #2 - DEEP VENOUS THROMBOSIS OF THE LEFT COMMON FEMORAL VEIN:  Initially, CT scan was worrisome for DVT in the left lower extremity; as  a result, venous Dopplers were done which confirmed that there a DVT in  the left lower extremity in the common femoral vein.  The patient was  subsequently put on heparin, which was later transitioned to Coumadin.  The patient's INR was 2.4 at time of discharge.  He was put on Coumadin  5 mg p.o. two daily and is to follow up with Dr. Michaell Cowing in the outpatient  clinic for INR management.  Of note, the patient was taught how to use  Lovenox in case he was discharged prior to his INR being therapeutic. However, the patient remained in the hospital and his INR became  therapeutic and he went home on Coumadin.   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  The patient is to follow up with  Dr. Philipp Deputy in the outpatient clinic for further management of his AIDS.  Of note, the patient's CD4 count was 130 on September 03, 2006.   PROBLEM #4 - TACHYCARDIA:  The patient remained in sinus tachycardia; it  was likely secondary to his psoas abscess.  Of note, the patient was  also found to be orthostatic on September 10, 2006.  The patient was  subsequently given IV fluids.   PROBLEM #5 - DEPRESSION:  The patient was continued on his Elavil during  hospitalization.  For further details, please see hospital chart.   DISCHARGE LABORATORY DATA  AND VITALS:  WBC 7.3, hemoglobin 13.2,  hematocrit 40.5, MCV 92.6, RDW 12.3, platelets 651,000.  PT 27.0, INR  2.4, heparin 0.57.  The patient's last BMET on September 09, 2006, two days  prior to discharge, include  a sodium of 133, potassium 4.1, chloride  100, bicarb 26, glucose 79, BUN 12, creatinine 0.99, GFR greater than  60, calcium 9.3.  Of note, the abscess fluid was drained and color  turbid, white blood cells 180,000, glucose was less than 20, total  protein was 6.2, LDH was 17,393.   Discharge vitals include temperature 97.8, pulse 104, respiration rate  20, blood pressure 105/75, O2 SATs 99% on room air.      Rufina Falco, M.D.  Electronically Signed      Alvester Morin, M.D.  Electronically Signed    JY/MEDQ  D:  09/15/2006  T:  09/15/2006  Job:  [id number]   cc:   Carlus Pavlov, M.D.  Pablo Lawrence. Philipp Deputy, M.D.  Cherylynn Ridges, M.D.  Gabrielle Dare Janee Morn, M.D.  Attention:  Dr. Lorain Childes [facility]

## 2010-11-02 ENCOUNTER — Ambulatory Visit: Payer: Self-pay | Admitting: Adult Health

## 2010-11-04 ENCOUNTER — Other Ambulatory Visit: Payer: Self-pay | Admitting: Adult Health

## 2010-11-04 DIAGNOSIS — G4700 Insomnia, unspecified: Secondary | ICD-10-CM

## 2010-11-09 ENCOUNTER — Other Ambulatory Visit: Payer: Self-pay | Admitting: Adult Health

## 2010-11-09 DIAGNOSIS — K056 Periodontal disease, unspecified: Secondary | ICD-10-CM

## 2010-11-09 DIAGNOSIS — R52 Pain, unspecified: Secondary | ICD-10-CM

## 2010-11-16 ENCOUNTER — Ambulatory Visit: Payer: Self-pay | Admitting: Adult Health

## 2011-02-07 ENCOUNTER — Other Ambulatory Visit: Payer: Self-pay

## 2011-02-14 ENCOUNTER — Ambulatory Visit: Payer: Self-pay | Admitting: Adult Health

## 2011-02-18 ENCOUNTER — Ambulatory Visit: Payer: Self-pay | Admitting: Adult Health

## 2011-02-28 LAB — T-HELPER CELL (CD4) - (RCID CLINIC ONLY): CD4 T Cell Abs: 60 — ABNORMAL LOW

## 2011-03-02 ENCOUNTER — Other Ambulatory Visit: Payer: Self-pay

## 2011-03-15 LAB — COMPREHENSIVE METABOLIC PANEL
ALT: 26
ALT: 26
ALT: 30
AST: 25
Albumin: 3.2 — ABNORMAL LOW
Albumin: 3.5
Alkaline Phosphatase: 125 — ABNORMAL HIGH
Alkaline Phosphatase: 130 — ABNORMAL HIGH
Alkaline Phosphatase: 138 — ABNORMAL HIGH
BUN: 6
BUN: 8
CO2: 25
Calcium: 9.2
Calcium: 9.5
GFR calc non Af Amer: >60
Glucose, Bld: 124 — ABNORMAL HIGH
Glucose, Bld: 96
Potassium: 3.5
Potassium: 3.5
Potassium: 3.8
Sodium: 135
Sodium: 137
Sodium: 137
Total Protein: 8
Total Protein: 8.1

## 2011-03-15 LAB — ROCKY MTN SPOTTED FVR AB, IGG-BLOOD: RMSF IgG: 0.07 {ISR}

## 2011-03-15 LAB — AFB CULTURE WITH SMEAR (NOT AT ARMC)

## 2011-03-15 LAB — BASIC METABOLIC PANEL
BUN: 7
Chloride: 103
GFR calc Af Amer: >60
GFR calc non Af Amer: >60
Potassium: 3.5
Sodium: 136

## 2011-03-15 LAB — CBC
HCT: 41.3
HCT: 46
Hemoglobin: 13.7
Hemoglobin: 15.5
MCHC: 33.6
MCV: 92.5
MCV: 92.7
Platelets: 345
RBC: 4.46
RBC: 4.83
RBC: 5.05
RDW: 11.2 — ABNORMAL LOW
WBC: 3.1 — ABNORMAL LOW
WBC: 5.8

## 2011-03-15 LAB — CSF CELL COUNT WITH DIFFERENTIAL
Lymphs, CSF: 94 — ABNORMAL HIGH
Monocyte-Macrophage-Spinal Fluid: 5 — ABNORMAL LOW
Monocyte-Macrophage-Spinal Fluid: 6 — ABNORMAL LOW
RBC Count, CSF: 1135 — ABNORMAL HIGH
RBC Count, CSF: 33 — ABNORMAL HIGH
Segmented Neutrophils-CSF: 1
WBC, CSF: 28 — ABNORMAL HIGH
WBC, CSF: 39 — ABNORMAL HIGH

## 2011-03-15 LAB — GRAM STAIN

## 2011-03-15 LAB — BODY FLUID CELL COUNT WITH DIFFERENTIAL
Neutrophil Count, Fluid: 29 — ABNORMAL HIGH
Total Nucleated Cell Count, Fluid: 652

## 2011-03-15 LAB — FUNGUS CULTURE, BLOOD: Culture: NO GROWTH

## 2011-03-15 LAB — PROTIME-INR
INR: 1.1
Prothrombin Time: 14.7

## 2011-03-15 LAB — CULTURE, ROUTINE-ABSCESS: Culture: NO GROWTH

## 2011-03-15 LAB — URINALYSIS, ROUTINE W REFLEX MICROSCOPIC
Hgb urine dipstick: NEGATIVE
Ketones, ur: 15 — AB
Nitrite: NEGATIVE
Protein, ur: NEGATIVE
Specific Gravity, Urine: 1.037 — ABNORMAL HIGH
Urobilinogen, UA: 0.2

## 2011-03-15 LAB — FUNGUS CULTURE W SMEAR

## 2011-03-15 LAB — RAPID URINE DRUG SCREEN, HOSP PERFORMED
Amphetamines: NOT DETECTED
Opiates: POSITIVE — AB
Tetrahydrocannabinol: NOT DETECTED

## 2011-03-15 LAB — CSF CULTURE W GRAM STAIN

## 2011-03-15 LAB — CULTURE, BLOOD (ROUTINE X 2): Culture: NO GROWTH

## 2011-03-15 LAB — DIFFERENTIAL
Basophils Absolute: 0.1
Basophils Relative: 1
Eosinophils Absolute: 0
Neutrophils Relative %: 71

## 2011-03-15 LAB — ROCKY MTN SPOTTED FVR AB, IGM-BLOOD: RMSF IgM: 0.75

## 2011-03-15 LAB — T-HELPER CELLS (CD4) COUNT (NOT AT ARMC)
CD4 % Helper T Cell: 10 — ABNORMAL LOW
CD4 T Cell Abs: 100 — ABNORMAL LOW

## 2011-03-15 LAB — GLUCOSE, CSF: Glucose, CSF: 44

## 2011-03-15 LAB — VDRL, CSF: VDRL Quant, CSF: NONREACTIVE

## 2011-03-15 LAB — PROTEIN, CSF: Total  Protein, CSF: 171 — ABNORMAL HIGH

## 2011-03-16 ENCOUNTER — Ambulatory Visit: Payer: Self-pay | Admitting: Infectious Diseases

## 2011-03-22 ENCOUNTER — Other Ambulatory Visit (INDEPENDENT_AMBULATORY_CARE_PROVIDER_SITE_OTHER): Payer: Medicaid Other

## 2011-03-22 ENCOUNTER — Other Ambulatory Visit: Payer: Self-pay | Admitting: Infectious Diseases

## 2011-03-22 DIAGNOSIS — B20 Human immunodeficiency virus [HIV] disease: Secondary | ICD-10-CM

## 2011-03-22 DIAGNOSIS — Z113 Encounter for screening for infections with a predominantly sexual mode of transmission: Secondary | ICD-10-CM

## 2011-03-22 DIAGNOSIS — Z79899 Other long term (current) drug therapy: Secondary | ICD-10-CM

## 2011-03-23 LAB — CBC WITH DIFFERENTIAL/PLATELET
Basophils Absolute: 0.1 10*3/uL (ref 0.0–0.1)
Basophils Relative: 2 % — ABNORMAL HIGH (ref 0–1)
HCT: 44 % (ref 39.0–52.0)
Hemoglobin: 14.1 g/dL (ref 13.0–17.0)
Lymphocytes Relative: 40 % (ref 12–46)
MCHC: 32 g/dL (ref 30.0–36.0)
Monocytes Absolute: 0.4 10*3/uL (ref 0.1–1.0)
Monocytes Relative: 11 % (ref 3–12)
Neutro Abs: 1.5 10*3/uL — ABNORMAL LOW (ref 1.7–7.7)
Neutrophils Relative %: 36 % — ABNORMAL LOW (ref 43–77)
RBC: 4.48 MIL/uL (ref 4.22–5.81)
WBC: 4.1 10*3/uL (ref 4.0–10.5)

## 2011-03-23 LAB — COMPREHENSIVE METABOLIC PANEL
AST: 35 U/L (ref 0–37)
Albumin: 4.5 g/dL (ref 3.5–5.2)
Alkaline Phosphatase: 81 U/L (ref 39–117)
BUN: 14 mg/dL (ref 6–23)
Calcium: 9.4 mg/dL (ref 8.4–10.5)
Chloride: 105 mEq/L (ref 96–112)
Glucose, Bld: 72 mg/dL (ref 70–99)
Potassium: 4.1 mEq/L (ref 3.5–5.3)
Sodium: 142 mEq/L (ref 135–145)
Total Protein: 7.9 g/dL (ref 6.0–8.3)

## 2011-03-23 LAB — T-HELPER CELL (CD4) - (RCID CLINIC ONLY)
CD4 % Helper T Cell: 12 % — ABNORMAL LOW (ref 33–55)
CD4 T Cell Abs: 180 uL — ABNORMAL LOW (ref 400–2700)

## 2011-03-23 LAB — LIPID PANEL
LDL Cholesterol: 75 mg/dL (ref 0–99)
VLDL: 19 mg/dL (ref 0–40)

## 2011-03-23 LAB — URINALYSIS, ROUTINE W REFLEX MICROSCOPIC
Leukocytes, UA: NEGATIVE
Nitrite: NEGATIVE
Specific Gravity, Urine: 1.025 (ref 1.005–1.030)
Urobilinogen, UA: 0.2 mg/dL (ref 0.0–1.0)
pH: 7.5 (ref 5.0–8.0)

## 2011-03-24 LAB — HIV-1 RNA QUANT-NO REFLEX-BLD
HIV 1 RNA Quant: 124000 copies/mL — ABNORMAL HIGH (ref <20)
HIV-1 RNA Quant, Log: 5.09 {Log} — ABNORMAL HIGH (ref <1.30)

## 2011-03-25 ENCOUNTER — Other Ambulatory Visit: Payer: Self-pay | Admitting: Infectious Diseases

## 2011-03-25 DIAGNOSIS — B20 Human immunodeficiency virus [HIV] disease: Secondary | ICD-10-CM

## 2011-03-30 LAB — HIV-1 GENOTYPR PLUS

## 2011-04-06 ENCOUNTER — Encounter: Payer: Self-pay | Admitting: Infectious Diseases

## 2011-04-06 ENCOUNTER — Other Ambulatory Visit: Payer: Self-pay | Admitting: *Deleted

## 2011-04-06 ENCOUNTER — Ambulatory Visit (INDEPENDENT_AMBULATORY_CARE_PROVIDER_SITE_OTHER): Payer: Medicaid Other | Admitting: Infectious Diseases

## 2011-04-06 VITALS — BP 137/87 | HR 83 | Temp 97.9°F | Ht 71.0 in | Wt 134.0 lb

## 2011-04-06 DIAGNOSIS — B20 Human immunodeficiency virus [HIV] disease: Secondary | ICD-10-CM

## 2011-04-06 DIAGNOSIS — B009 Herpesviral infection, unspecified: Secondary | ICD-10-CM

## 2011-04-06 DIAGNOSIS — Z23 Encounter for immunization: Secondary | ICD-10-CM

## 2011-04-06 MED ORDER — SULFAMETHOXAZOLE-TMP DS 800-160 MG PO TABS: 1.0000 | ORAL_TABLET | Freq: Every day | ORAL | Status: DC

## 2011-04-06 MED ORDER — VALGANCICLOVIR HCL 450 MG PO TABS: 450.0000 mg | ORAL_TABLET | Freq: Two times a day (BID) | ORAL | Status: DC

## 2011-04-06 MED ORDER — RITONAVIR 100 MG PO TABS: 100.0000 mg | ORAL_TABLET | Freq: Every day | ORAL | Status: DC

## 2011-04-06 MED ORDER — DARUNAVIR ETHANOLATE 400 MG PO TABS: 800.0000 mg | ORAL_TABLET | Freq: Every day | ORAL | Status: DC

## 2011-04-06 MED ORDER — EMTRICITABINE-TENOFOVIR DF 200-300 MG PO TABS: 1.0000 | ORAL_TABLET | Freq: Every day | ORAL | Status: DC

## 2011-04-06 NOTE — Assessment & Plan Note (Signed)
Is off meds, will refill and see back in 3 months. Vaccines are up to date. Will check labs prior to visit. Given condoms. Stop azithro as CD4 > 100.

## 2011-04-06 NOTE — Progress Notes (Signed)
  Subjective:    Patient ID: Stephen Griffin, male    DOB: Aug 24, 1980, 30 y.o.   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 Previously started on DRVr/TFV. Last CD4 180 and VL 124 k wth genotype K101E/[id number],K103N,Y181C,K219E (03-22-11).  Has been off art since she got out of jail last month. Feeling well. Has ADAP.    Review of Systems  Constitutional: Negative for appetite change and unexpected weight change.  Respiratory: Negative for cough and shortness of breath.   Gastrointestinal: Negative for diarrhea and constipation.  Genitourinary: Negative for dysuria.  Neurological: Negative for headaches.       Objective:   Physical Exam  Constitutional: He appears well-developed and well-nourished.  Eyes: EOM are normal. Pupils are equal, round, and reactive to light.  Neck: Neck supple.  Cardiovascular: Normal rate, regular rhythm and normal heart sounds.   Pulmonary/Chest: Effort normal and breath sounds normal.  Abdominal: Soft. Bowel sounds are normal. He exhibits no distension. There is no tenderness.  Lymphadenopathy:    He has no cervical adenopathy.          Assessment & Plan:

## 2011-04-07 ENCOUNTER — Other Ambulatory Visit: Payer: Self-pay | Admitting: *Deleted

## 2011-04-07 DIAGNOSIS — B009 Herpesviral infection, unspecified: Secondary | ICD-10-CM

## 2011-04-07 MED ORDER — VALGANCICLOVIR HCL 450 MG PO TABS: 450.0000 mg | ORAL_TABLET | Freq: Two times a day (BID) | ORAL | Status: DC

## 2011-04-07 NOTE — Telephone Encounter (Signed)
Pharmacist wanted to make sure the bid med that was refilled yesterday should be #60 not 30. Agreed that she should change it to 60

## 2011-04-18 ENCOUNTER — Other Ambulatory Visit: Payer: Self-pay | Admitting: *Deleted

## 2011-04-18 DIAGNOSIS — R52 Pain, unspecified: Secondary | ICD-10-CM

## 2011-04-18 DIAGNOSIS — G4700 Insomnia, unspecified: Secondary | ICD-10-CM

## 2011-04-18 MED ORDER — TRAMADOL HCL 50 MG PO TABS: 50.0000 mg | ORAL_TABLET | ORAL | Status: DC | PRN

## 2011-04-18 MED ORDER — TRAZODONE HCL 50 MG PO TABS: 50.0000 mg | ORAL_TABLET | Freq: Every day | ORAL | Status: DC

## 2011-04-27 ENCOUNTER — Other Ambulatory Visit: Payer: Self-pay | Admitting: Infectious Diseases

## 2011-04-27 NOTE — Telephone Encounter (Signed)
rec'd electronic refill request for tramadol. I called pt. She takes 3 a day. States she has plenty but is worried about running out. Told her it can be refilled 05/18/11 if she was out. If she is taking 3 a day, 120 will last beyond 05/18/11-40 days instead of 30 days. She was ok with this

## 2011-05-04 ENCOUNTER — Other Ambulatory Visit: Payer: Self-pay | Admitting: Infectious Diseases

## 2011-05-16 ENCOUNTER — Other Ambulatory Visit: Payer: Self-pay | Admitting: *Deleted

## 2011-05-16 ENCOUNTER — Other Ambulatory Visit: Payer: Self-pay | Admitting: Infectious Diseases

## 2011-05-16 NOTE — Telephone Encounter (Signed)
He called asking for a refill of his tramadol. States it is for his chest pain. Has had this for a month. Also endorses SOB, nausea. located in center of chest. Describes as sharp & stabbing and pressure. States it comes & goes. Tramadol makes it better. Got #120 on 04/18/11. Has 4 pills left. States this has been going on for a month. Did not tell md about it at last appt because it "was not hurting then" . Rates pain as 8/10. Sent him to ED. Denied the tramadol . States he will find a ride to hospital. Told him to use EMS if no one available to take him now. He agreed with this plan  Also suggested he find a primary since medicaid will pay for it.

## 2011-05-16 NOTE — Telephone Encounter (Signed)
Pt did not know how to request refills on his medications.  RN unable to call pt back after be disconnected.  Non-working phone number in the pt's record.  RN called [facility] and left message to refill the pt's medications and send them to him.

## 2011-05-18 ENCOUNTER — Other Ambulatory Visit: Payer: Self-pay | Admitting: *Deleted

## 2011-05-18 DIAGNOSIS — G629 Polyneuropathy, unspecified: Secondary | ICD-10-CM

## 2011-05-18 MED ORDER — TRAMADOL HCL 50 MG PO TABS: 50.0000 mg | ORAL_TABLET | ORAL | Status: DC | PRN

## 2011-05-18 NOTE — Telephone Encounter (Signed)
Patient called requesting refill on Tramadol.  Spoke with Dr. Ninetta Lights and he agreed to refill, but changed the quantity from 120 to 60.  Rx sent to [facility]. Wendall Mola CMA

## 2011-06-02 ENCOUNTER — Other Ambulatory Visit: Payer: Self-pay | Admitting: Infectious Diseases

## 2011-06-02 DIAGNOSIS — R52 Pain, unspecified: Secondary | ICD-10-CM

## 2011-06-02 DIAGNOSIS — G4700 Insomnia, unspecified: Secondary | ICD-10-CM

## 2011-07-01 ENCOUNTER — Other Ambulatory Visit: Payer: Self-pay | Admitting: *Deleted

## 2011-07-01 DIAGNOSIS — B20 Human immunodeficiency virus [HIV] disease: Secondary | ICD-10-CM

## 2011-07-01 MED ORDER — DARUNAVIR ETHANOLATE 800 MG PO TABS: 800.0000 mg | ORAL_TABLET | Freq: Every day | ORAL | Status: DC

## 2011-11-15 ENCOUNTER — Other Ambulatory Visit: Payer: Self-pay | Admitting: *Deleted

## 2011-11-15 DIAGNOSIS — B20 Human immunodeficiency virus [HIV] disease: Secondary | ICD-10-CM

## 2011-11-15 MED ORDER — SULFAMETHOXAZOLE-TMP DS 800-160 MG PO TABS: 1.0000 | ORAL_TABLET | Freq: Every day | ORAL | Status: DC

## 2011-11-17 ENCOUNTER — Other Ambulatory Visit: Payer: Medicaid Other

## 2011-11-17 ENCOUNTER — Other Ambulatory Visit: Payer: Self-pay | Admitting: Infectious Diseases

## 2011-11-17 ENCOUNTER — Other Ambulatory Visit (HOSPITAL_COMMUNITY)
Admission: RE | Admit: 2011-11-17 | Discharge: 2011-11-17 | Disposition: A | Discharge status: Home / Self Care | Payer: Medicaid Other | Source: Ambulatory Visit | Attending: Infectious Diseases | Admitting: Infectious Diseases

## 2011-11-17 DIAGNOSIS — Z113 Encounter for screening for infections with a predominantly sexual mode of transmission: Secondary | ICD-10-CM | POA: Insufficient documentation

## 2011-11-17 DIAGNOSIS — B20 Human immunodeficiency virus [HIV] disease: Secondary | ICD-10-CM

## 2011-11-17 LAB — CBC WITH DIFFERENTIAL/PLATELET
Basophils Relative: 1 % (ref 0–1)
Eosinophils Absolute: 0 10*3/uL (ref 0.0–0.7)
MCH: 33.1 pg (ref 26.0–34.0)
MCHC: 33.9 g/dL (ref 30.0–36.0)
Neutro Abs: 0.5 10*3/uL — ABNORMAL LOW (ref 1.7–7.7)
Neutrophils Relative %: 32 % — ABNORMAL LOW (ref 43–77)
Platelets: 204 10*3/uL (ref 150–400)
RBC: 5.14 MIL/uL (ref 4.22–5.81)

## 2011-11-17 LAB — COMPLETE METABOLIC PANEL WITH GFR
ALT: 38 U/L (ref 0–53)
AST: 55 U/L — ABNORMAL HIGH (ref 0–37)
Alkaline Phosphatase: 102 U/L (ref 39–117)
Potassium: 3.5 mEq/L (ref 3.5–5.3)
Sodium: 146 mEq/L — ABNORMAL HIGH (ref 135–145)
Total Bilirubin: 0.5 mg/dL (ref 0.3–1.2)
Total Protein: 7.7 g/dL (ref 6.0–8.3)

## 2011-11-17 LAB — LIPID PANEL
HDL: 35 mg/dL — ABNORMAL LOW (ref >39)
LDL Cholesterol: 34 mg/dL (ref 0–99)
Total CHOL/HDL Ratio: 2.7 Ratio
VLDL: 24 mg/dL (ref 0–40)

## 2011-11-18 LAB — HIV-1 RNA ULTRAQUANT REFLEX TO GENTYP+
HIV 1 RNA Quant: 259807 copies/mL — ABNORMAL HIGH (ref <20)
HIV-1 RNA Quant, Log: 5.41 {Log} — ABNORMAL HIGH (ref <1.30)

## 2011-11-18 LAB — RPR

## 2011-11-25 LAB — HIV-1 GENOTYPR PLUS

## 2011-12-21 ENCOUNTER — Telehealth: Payer: Self-pay | Admitting: *Deleted

## 2011-12-21 ENCOUNTER — Ambulatory Visit: Payer: Medicaid Other | Admitting: Infectious Diseases

## 2011-12-21 NOTE — Telephone Encounter (Signed)
Routed to scheduler for new appt.

## 2012-01-04 ENCOUNTER — Other Ambulatory Visit: Payer: Self-pay | Admitting: Licensed Clinical Social Worker

## 2012-01-04 DIAGNOSIS — R52 Pain, unspecified: Secondary | ICD-10-CM

## 2012-01-04 MED ORDER — TRAMADOL HCL 50 MG PO TABS: 50.0000 mg | ORAL_TABLET | ORAL | Status: DC

## 2012-01-10 ENCOUNTER — Ambulatory Visit: Payer: Medicaid Other | Admitting: Infectious Diseases

## 2012-01-10 ENCOUNTER — Other Ambulatory Visit: Payer: Self-pay | Admitting: *Deleted

## 2012-01-10 DIAGNOSIS — G4700 Insomnia, unspecified: Secondary | ICD-10-CM

## 2012-01-10 MED ORDER — TRAZODONE HCL 50 MG PO TABS: 50.0000 mg | ORAL_TABLET | Freq: Every day | ORAL | Status: DC

## 2012-01-10 NOTE — Telephone Encounter (Signed)
Pt has scheduled f/u appt 03/05/12 w/ Dr. Ninetta Lights.  RN reviewed the pt's lab results.  HIV lab values suggest an appt sooner than October.  Phone call to pt.  Message left for pt.  Pt given appt for tomorrow, Wed., Aug., 14 w/ Dr. Ninetta Lights to review lab results.

## 2012-01-11 ENCOUNTER — Telehealth: Payer: Self-pay | Admitting: *Deleted

## 2012-01-11 ENCOUNTER — Ambulatory Visit: Payer: Medicaid Other | Admitting: Infectious Diseases

## 2012-01-11 NOTE — Telephone Encounter (Signed)
Called patient because he no showed today, his appt had already been rescheduled for October.  Scheduled a lab appt in Sept. Because last labs were done in June and will need more recent if not being seen until October. Wendall Mola CMA

## 2012-02-22 ENCOUNTER — Other Ambulatory Visit: Payer: Medicaid Other

## 2012-03-05 ENCOUNTER — Ambulatory Visit: Payer: Medicaid Other | Admitting: Infectious Diseases

## 2012-03-19 ENCOUNTER — Other Ambulatory Visit: Payer: Self-pay | Admitting: Infectious Diseases

## 2012-03-19 ENCOUNTER — Other Ambulatory Visit: Payer: Medicaid Other

## 2012-03-19 DIAGNOSIS — B20 Human immunodeficiency virus [HIV] disease: Secondary | ICD-10-CM

## 2012-04-02 ENCOUNTER — Ambulatory Visit: Payer: Medicaid Other | Admitting: Infectious Diseases

## 2012-04-03 ENCOUNTER — Telehealth: Payer: Self-pay | Admitting: *Deleted

## 2012-04-03 ENCOUNTER — Other Ambulatory Visit: Payer: Self-pay | Admitting: *Deleted

## 2012-04-03 DIAGNOSIS — B009 Herpesviral infection, unspecified: Secondary | ICD-10-CM

## 2012-04-03 DIAGNOSIS — B20 Human immunodeficiency virus [HIV] disease: Secondary | ICD-10-CM

## 2012-04-03 DIAGNOSIS — G4700 Insomnia, unspecified: Secondary | ICD-10-CM

## 2012-04-03 MED ORDER — EMTRICITABINE-TENOFOVIR DF 200-300 MG PO TABS: 1.0000 | ORAL_TABLET | Freq: Every day | ORAL | Status: DC

## 2012-04-03 MED ORDER — RITONAVIR 100 MG PO TABS: 100.0000 mg | ORAL_TABLET | Freq: Every day | ORAL | Status: DC

## 2012-04-03 MED ORDER — TRAZODONE HCL 50 MG PO TABS: 50.0000 mg | ORAL_TABLET | Freq: Every day | ORAL | Status: DC

## 2012-04-03 MED ORDER — VALGANCICLOVIR HCL 450 MG PO TABS: 450.0000 mg | ORAL_TABLET | Freq: Two times a day (BID) | ORAL | Status: DC

## 2012-04-03 NOTE — Telephone Encounter (Signed)
Called patient and transferred to front to reschedule appt, no showed yesterday. Stephen Griffin

## 2012-04-03 NOTE — Telephone Encounter (Signed)
Message sent to pharmacy, "pt needs to make and keep lab and MD appointments for future refills."

## 2012-04-03 NOTE — Telephone Encounter (Signed)
Message left for pt to call [facility] to make lab work appt and reschedule MD appt for 2 weeks after labs.  Will limit refill on medications to ONE.  Last MD appt 04/06/11.

## 2012-04-11 ENCOUNTER — Ambulatory Visit (INDEPENDENT_AMBULATORY_CARE_PROVIDER_SITE_OTHER): Payer: Medicaid Other | Admitting: Infectious Diseases

## 2012-04-11 ENCOUNTER — Other Ambulatory Visit (HOSPITAL_COMMUNITY)
Admission: RE | Admit: 2012-04-11 | Discharge: 2012-04-11 | Disposition: A | Discharge status: Home / Self Care | Payer: Medicaid Other | Source: Ambulatory Visit | Attending: Infectious Diseases | Admitting: Infectious Diseases

## 2012-04-11 ENCOUNTER — Encounter: Payer: Self-pay | Admitting: Infectious Diseases

## 2012-04-11 VITALS — BP 155/105 | HR 88 | Temp 98.2°F | Ht 71.0 in | Wt 133.5 lb

## 2012-04-11 DIAGNOSIS — Z23 Encounter for immunization: Secondary | ICD-10-CM

## 2012-04-11 DIAGNOSIS — K625 Hemorrhage of anus and rectum: Secondary | ICD-10-CM | POA: Insufficient documentation

## 2012-04-11 DIAGNOSIS — Z113 Encounter for screening for infections with a predominantly sexual mode of transmission: Secondary | ICD-10-CM | POA: Insufficient documentation

## 2012-04-11 DIAGNOSIS — B20 Human immunodeficiency virus [HIV] disease: Secondary | ICD-10-CM

## 2012-04-11 MED ORDER — MEGESTROL ACETATE 625 MG/5ML PO SUSP: 625.0000 mg | Freq: Every day | ORAL | Status: DC

## 2012-04-11 NOTE — Assessment & Plan Note (Signed)
Will check H/H today. If continues will refer to GI at f/u.

## 2012-04-11 NOTE — Progress Notes (Signed)
  Subjective:    Patient ID: Stephen Griffin, male    DOB: Jul 26, 1980, 31 y.o.   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 Previously started on DRVr/TFV. Last CD4 180 and VL 124 k wth genotype [id number]/Q,K103N,Y181C,K219E (03-22-11).  Not seen in [facility] since 2012.  Today with generalized aches for 3 months. Has had rectal bleeding for 3 weeks- when he moves his bowels. No hx of hemorrhoids. No hx of warts. Blood is mixed in with stool. Has had new sex partners.  Has had wt loss of ? Clothes are becoming loose. Decreased appetite.    HIV 1 RNA Quant (copies/mL)  Date Value  11/17/2011 409811*  03/22/2011 124000*  06/10/2010 214*     CD4 T Cell Abs (cmm)  Date Value  11/17/2011 70*  03/22/2011 180*  06/10/2010 20*   Has been missing doses of ART.   Review of Systems  Constitutional: Positive for appetite change and unexpected weight change. Negative for fever and chills.  HENT: Negative for mouth sores.   Gastrointestinal: Positive for anal bleeding. Negative for diarrhea and constipation.  Genitourinary: Negative for difficulty urinating.       Objective:   Physical Exam  Constitutional: He appears well-developed and well-nourished.  HENT:  Mouth/Throat: No oropharyngeal exudate.  Eyes: EOM are normal. Pupils are equal, round, and reactive to light.  Neck: Neck supple.  Cardiovascular: Normal rate, regular rhythm and normal heart sounds.   Pulmonary/Chest: Effort normal and breath sounds normal.  Abdominal: Soft. Bowel sounds are normal. There is no tenderness. There is no rebound.  Genitourinary:     Lymphadenopathy:    He has no cervical adenopathy.          Assessment & Plan:

## 2012-04-11 NOTE — Assessment & Plan Note (Addendum)
Doing poorly. Needs adherence counselor. Given condoms, flu shot. Will start him on megace. Anal pap sent. Will see him back in 6 weeks.

## 2012-04-12 LAB — CBC
HCT: 51.9 % (ref 39.0–52.0)
Hemoglobin: 17.4 g/dL — ABNORMAL HIGH (ref 13.0–17.0)
MCV: 96.3 fL (ref 78.0–100.0)
RBC: 5.39 MIL/uL (ref 4.22–5.81)
RDW: 13.4 % (ref 11.5–15.5)
WBC: 2.5 10*3/uL — ABNORMAL LOW (ref 4.0–10.5)

## 2012-04-24 ENCOUNTER — Other Ambulatory Visit: Payer: Self-pay | Admitting: *Deleted

## 2012-04-24 DIAGNOSIS — R52 Pain, unspecified: Secondary | ICD-10-CM

## 2012-04-24 DIAGNOSIS — B20 Human immunodeficiency virus [HIV] disease: Secondary | ICD-10-CM

## 2012-04-24 DIAGNOSIS — K056 Periodontal disease, unspecified: Secondary | ICD-10-CM

## 2012-04-24 MED ORDER — TRAMADOL HCL 50 MG PO TABS: 50.0000 mg | ORAL_TABLET | ORAL | Status: DC

## 2012-04-24 MED ORDER — MEGESTROL ACETATE 625 MG/5ML PO SUSP: 625.0000 mg | Freq: Every day | ORAL | Status: DC

## 2012-04-24 NOTE — Progress Notes (Signed)
Error

## 2012-05-03 ENCOUNTER — Other Ambulatory Visit: Payer: Self-pay | Admitting: Licensed Clinical Social Worker

## 2012-05-03 DIAGNOSIS — G4700 Insomnia, unspecified: Secondary | ICD-10-CM

## 2012-05-03 DIAGNOSIS — B009 Herpesviral infection, unspecified: Secondary | ICD-10-CM

## 2012-05-03 DIAGNOSIS — B20 Human immunodeficiency virus [HIV] disease: Secondary | ICD-10-CM

## 2012-05-03 MED ORDER — TRAZODONE HCL 50 MG PO TABS: 50.0000 mg | ORAL_TABLET | Freq: Every day | ORAL | Status: DC

## 2012-05-03 MED ORDER — VALGANCICLOVIR HCL 450 MG PO TABS: 450.0000 mg | ORAL_TABLET | Freq: Two times a day (BID) | ORAL | Status: DC

## 2012-05-03 MED ORDER — RITONAVIR 100 MG PO TABS: 100.0000 mg | ORAL_TABLET | Freq: Every day | ORAL | Status: DC

## 2012-05-03 MED ORDER — EMTRICITABINE-TENOFOVIR DF 200-300 MG PO TABS: 1.0000 | ORAL_TABLET | Freq: Every day | ORAL | Status: DC

## 2012-05-03 MED ORDER — SULFAMETHOXAZOLE-TMP DS 800-160 MG PO TABS: 1.0000 | ORAL_TABLET | Freq: Every day | ORAL | Status: DC

## 2012-05-08 ENCOUNTER — Other Ambulatory Visit: Payer: Medicaid Other

## 2012-05-11 ENCOUNTER — Other Ambulatory Visit: Payer: Medicaid Other | Admitting: Infectious Diseases

## 2012-05-22 ENCOUNTER — Ambulatory Visit: Payer: Medicaid Other | Admitting: Infectious Diseases

## 2012-05-22 ENCOUNTER — Other Ambulatory Visit: Payer: Medicaid Other

## 2012-06-22 ENCOUNTER — Telehealth: Payer: Self-pay | Admitting: *Deleted

## 2012-06-22 NOTE — Telephone Encounter (Signed)
Medicaid requested a change of Megestrol 200mg /60ml for Megace Es 625mg /22ml.  Called [facility] pharmacist to verify this change, requesting that they match the same dosage as closely as possible. Andree Coss, RN

## 2012-06-26 ENCOUNTER — Telehealth: Payer: Self-pay | Admitting: *Deleted

## 2012-06-26 DIAGNOSIS — B20 Human immunodeficiency virus [HIV] disease: Secondary | ICD-10-CM

## 2012-06-26 MED ORDER — DARUNAVIR ETHANOLATE 800 MG PO TABS: 800.0000 mg | ORAL_TABLET | Freq: Every day | ORAL | Status: DC

## 2012-06-26 NOTE — Telephone Encounter (Signed)
Message left.  Needs to make and keep lab and MD appts.

## 2012-07-04 ENCOUNTER — Other Ambulatory Visit: Payer: Medicaid Other

## 2012-07-04 DIAGNOSIS — B20 Human immunodeficiency virus [HIV] disease: Secondary | ICD-10-CM

## 2012-07-04 LAB — CBC WITH DIFFERENTIAL/PLATELET
Basophils Absolute: 0 10*3/uL (ref 0.0–0.1)
Basophils Relative: 0 % (ref 0–1)
Eosinophils Absolute: 0 10*3/uL (ref 0.0–0.7)
Eosinophils Relative: 1 % (ref 0–5)
HCT: 45.2 % (ref 39.0–52.0)
MCH: 30.4 pg (ref 26.0–34.0)
MCHC: 33.2 g/dL (ref 30.0–36.0)
Monocytes Absolute: 0.3 10*3/uL (ref 0.1–1.0)
Monocytes Relative: 10 % (ref 3–12)
Neutro Abs: 2.6 10*3/uL (ref 1.7–7.7)
RDW: 13.6 % (ref 11.5–15.5)

## 2012-07-05 LAB — T-HELPER CELL (CD4) - (RCID CLINIC ONLY)
CD4 % Helper T Cell: 3 % — ABNORMAL LOW (ref 33–55)
CD4 T Cell Abs: 10 uL — ABNORMAL LOW (ref 400–2700)

## 2012-07-05 LAB — COMPREHENSIVE METABOLIC PANEL
AST: 37 U/L (ref 0–37)
Alkaline Phosphatase: 123 U/L — ABNORMAL HIGH (ref 39–117)
BUN: 9 mg/dL (ref 6–23)
Creat: 0.98 mg/dL (ref 0.50–1.35)
Glucose, Bld: 82 mg/dL (ref 70–99)
Potassium: 3.5 mEq/L (ref 3.5–5.3)
Total Bilirubin: 0.6 mg/dL (ref 0.3–1.2)

## 2012-07-05 LAB — HIV-1 RNA QUANT-NO REFLEX-BLD: HIV-1 RNA Quant, Log: 5.21 {Log} — ABNORMAL HIGH (ref <1.30)

## 2012-07-18 ENCOUNTER — Ambulatory Visit: Payer: Medicaid Other | Admitting: Infectious Diseases

## 2012-07-18 ENCOUNTER — Telehealth: Payer: Self-pay | Admitting: *Deleted

## 2012-07-18 NOTE — Telephone Encounter (Signed)
Called patient and notified that she would have to come into our walk in clinic to be seen, due to frequent no shows.  She no showed today. Advised to call 07/28/12 to find out when the next walk in clinic is. Wendall Mola

## 2012-07-26 ENCOUNTER — Ambulatory Visit (INDEPENDENT_AMBULATORY_CARE_PROVIDER_SITE_OTHER): Payer: Medicaid Other | Admitting: Infectious Disease

## 2012-07-26 ENCOUNTER — Telehealth: Payer: Self-pay | Admitting: *Deleted

## 2012-07-26 ENCOUNTER — Encounter: Payer: Self-pay | Admitting: Infectious Disease

## 2012-07-26 VITALS — BP 127/91 | HR 94 | Temp 97.7°F | Ht 71.0 in | Wt 130.0 lb

## 2012-07-26 DIAGNOSIS — R64 Cachexia: Secondary | ICD-10-CM

## 2012-07-26 DIAGNOSIS — B20 Human immunodeficiency virus [HIV] disease: Secondary | ICD-10-CM

## 2012-07-26 DIAGNOSIS — B3781 Candidal esophagitis: Secondary | ICD-10-CM

## 2012-07-26 DIAGNOSIS — B259 Cytomegaloviral disease, unspecified: Secondary | ICD-10-CM

## 2012-07-26 MED ORDER — FLUCONAZOLE 200 MG PO TABS: 200.0000 mg | ORAL_TABLET | Freq: Every day | ORAL | Status: DC

## 2012-07-26 MED ORDER — EMTRICITABINE-TENOFOVIR DF 200-300 MG PO TABS: 1.0000 | ORAL_TABLET | Freq: Every day | ORAL | Status: DC

## 2012-07-26 MED ORDER — AZITHROMYCIN 600 MG PO TABS: 1200.0000 mg | ORAL_TABLET | ORAL | Status: DC

## 2012-07-26 MED ORDER — DOLUTEGRAVIR SODIUM 50 MG PO TABS: 50.0000 mg | ORAL_TABLET | Freq: Every day | ORAL | Status: DC

## 2012-07-26 MED ORDER — SULFAMETHOXAZOLE-TMP DS 800-160 MG PO TABS: ORAL_TABLET | ORAL | Status: DC

## 2012-07-26 NOTE — Progress Notes (Signed)
Subjective:    Patient ID: Stephen Griffin, male    DOB: 19-May-1981, 32 y.o.   MRN: [id number]  HPI  32 year old transgender pt with 180 and VL 124 k wth genotype [id number]/Q,K103N,Y181C,K219E (03-22-11) who continues to be noncompliant with Prezista, Norvir and truvada coming into [facility] acutely with pain with eating and swallowing and thrush visibile on exam.   She is not taking any of her prescribed medications she admits with further questioning.  I am proposing to switch her to [facility] and Truvada which may have similarly HIGH barrier to R as PRZ/NRV/TRV but better side effect profile and only 2 pills.  I have emphasized that She needs tto Take ARV religiously every day not just for her own sake and her possibility of a normal quality of life and near normal lifespan but also for sake of her sexual partners whom she is putting at extremely HIGH risk for infection.    I spent greater than 45 minutes with the patient including greater than 50% of time in face to face counsel of the patient and in coordination of their care.       Review of Systems  Constitutional: Negative for fever, chills, diaphoresis, activity change, appetite change, fatigue and unexpected weight change.  HENT: Positive for sore throat and trouble swallowing. Negative for congestion, rhinorrhea, sneezing and sinus pressure.   Eyes: Negative for photophobia and visual disturbance.  Respiratory: Negative for cough, chest tightness, shortness of breath, wheezing and stridor.   Cardiovascular: Negative for chest pain, palpitations and leg swelling.  Gastrointestinal: Negative for nausea, vomiting, abdominal pain, diarrhea, constipation, blood in stool, abdominal distention and anal bleeding.  Genitourinary: Negative for dysuria, hematuria, flank pain and difficulty urinating.  Musculoskeletal: Negative for myalgias, back pain, joint swelling, arthralgias and gait problem.  Skin: Negative for color change, pallor, rash and  wound.  Neurological: Negative for dizziness, tremors, weakness and light-headedness.  Hematological: Negative for adenopathy. Does not bruise/bleed easily.  Psychiatric/Behavioral: Positive for dysphoric mood. Negative for behavioral problems, confusion, sleep disturbance, decreased concentration and agitation.       Objective:   Physical Exam  Constitutional: He is oriented to person, place, and time. He appears well-developed. No distress.  HENT:  Head: Normocephalic and atraumatic.  Mouth/Throat: Oropharyngeal exudate and posterior oropharyngeal edema present.    Eyes: Conjunctivae and EOM are normal. Pupils are equal, round, and reactive to light. No scleral icterus.  Neck: Normal range of motion. Neck supple.  Cardiovascular: Normal rate, regular rhythm and normal heart sounds.  Exam reveals no friction rub.   No murmur heard. Pulmonary/Chest: Effort normal and breath sounds normal. No respiratory distress. He has no wheezes.  Abdominal: He exhibits no distension. There is no tenderness. There is no rebound.  Musculoskeletal: He exhibits no edema and no tenderness.  Neurological: He is alert and oriented to person, place, and time. He exhibits normal muscle tone. Coordination normal.  Skin: Skin is warm and dry. He is not diaphoretic. No erythema. No pallor.  Psychiatric: He has a normal mood and affect. His behavior is normal. Judgment and thought content normal.          Assessment & Plan:   HIV:  --hopefully will adhere to  Tivicay and Truvada --asked her to take Bactrim at least 3 x weekly --asked her to take azithromycin 2 tabs weekly if possibly  Thrush: likely not just oral but esophageal --fluconazole 200mg  daily x 14 days  CMV? Disease: not clear to me  that she has proven CMV infection. 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 Will dc valcyte from list . Certainly she will be at risk if cd4  still low  Wasting: cotninue megace. ASKED HER NOT TO TAKE ENSURE AT SAME TIME AS TIVICAY  High risk sexual behavior: condoms and try to get her on ARVS! RTC in 2 weeks.

## 2012-07-26 NOTE — Telephone Encounter (Signed)
Pt requesting appt.  Painful oral lesions x 1 week.  Given "work-in" appt for this afternoon w/ Dr. Daiva Eves.

## 2012-07-26 NOTE — Patient Instructions (Addendum)
Your new regimen is:  TIVICAY 50MG  ONE YELLOW CIRCULAR TABLET ONCE DAILY  AND  TRUVADA ONE BLUE PILL ONCE DAILY  PLEASE, PLEASE TAKE THESE MEDICINES EVERY SINGLE DAY  PLEASE ALSO TAKE  BACTRIM(LARGE WHITE PILL) AT LEAST THREE TIMES WEEKLY FOR PREVENTION OF PNEUMONIA  AND  AZITHROMYCIN 600MG  TWO TABLETS ONCE WEEKLY TO PREVENT MAC INFECTION  I AM GIVING YOU FLUCONAZOLE 200MG  ONCE DAILY FOR 14 DAYS TO TREAT THRUSH  PLEASE MAKE FOLLOWUP APPT IN 2 WEEKS WITH ME

## 2012-07-27 ENCOUNTER — Other Ambulatory Visit: Payer: Self-pay | Admitting: *Deleted

## 2012-07-27 DIAGNOSIS — B20 Human immunodeficiency virus [HIV] disease: Secondary | ICD-10-CM

## 2012-07-30 ENCOUNTER — Other Ambulatory Visit: Payer: Self-pay | Admitting: Licensed Clinical Social Worker

## 2012-07-30 DIAGNOSIS — B20 Human immunodeficiency virus [HIV] disease: Secondary | ICD-10-CM

## 2012-07-30 MED ORDER — FLUCONAZOLE 200 MG PO TABS: 200.0000 mg | ORAL_TABLET | Freq: Every day | ORAL | Status: DC

## 2012-08-09 ENCOUNTER — Ambulatory Visit: Payer: Medicaid Other | Admitting: Infectious Disease

## 2012-08-15 ENCOUNTER — Telehealth: Payer: Self-pay | Admitting: *Deleted

## 2012-08-15 NOTE — Telephone Encounter (Signed)
Refill request for megace, tramadol, trazadone, and valcyte received from [facility].  Refills denied at this time - patient notified that she needs to keep her office appointment for 08/20/12 as she was changed to a new ARV regimen.  Patient had already cancelled one follow up appointment since the regimen change. Patient in agreement with this plan.  Patient reports that her "stomach is upset" when taking her new regimen.  RN spoke with Dr. Daiva Eves - this should not be caused by the pills, suggested that she take them with small meals.  Patient said "ok" and hung up. Andree Coss, RN

## 2012-08-20 ENCOUNTER — Encounter: Payer: Self-pay | Admitting: Infectious Disease

## 2012-08-20 ENCOUNTER — Ambulatory Visit (INDEPENDENT_AMBULATORY_CARE_PROVIDER_SITE_OTHER): Payer: Medicaid Other | Admitting: Infectious Disease

## 2012-08-20 VITALS — BP 153/103 | HR 88 | Temp 98.8°F | Wt 128.8 lb

## 2012-08-20 DIAGNOSIS — B20 Human immunodeficiency virus [HIV] disease: Secondary | ICD-10-CM

## 2012-08-20 DIAGNOSIS — Z7251 High risk heterosexual behavior: Secondary | ICD-10-CM

## 2012-08-20 DIAGNOSIS — B3781 Candidal esophagitis: Secondary | ICD-10-CM

## 2012-08-20 DIAGNOSIS — R109 Unspecified abdominal pain: Secondary | ICD-10-CM

## 2012-08-20 NOTE — Progress Notes (Signed)
  Subjective:    Patient ID: Stephen Griffin, male    DOB: 05/08/1981, 32 y.o.   MRN: [id number]  HPI   32 year old transgender pt with 180 and VL 124 k wth genotype K101E/Q,K103N,Y181C,K219E (03-22-11) who continues to be noncompliant with Prezista, Norvir and truvada whom I saw in  [facility] acutely last month  with pain with eating and swallowing and thrush visibile on exam.   I  switch her to [facility] and Truvada which may have similarly HIGH barrier to R as PRZ/NRV/TRV   She claimed to be taking ARV faithfully. She has some minor abdominal pain that she thinks is related to ARV.  We reviewed past VL and CD4 data and hopes to bring virus down and CD4 up. Reviewed nature of IRIS.  extremely HIGH risk for infection.    Review of Systems  Constitutional: Negative for fever, chills, diaphoresis, activity change, appetite change, fatigue and unexpected weight change.  HENT: Negative for congestion, sore throat, rhinorrhea, sneezing, trouble swallowing and sinus pressure.   Eyes: Negative for photophobia and visual disturbance.  Respiratory: Negative for cough, chest tightness, shortness of breath, wheezing and stridor.   Cardiovascular: Negative for chest pain, palpitations and leg swelling.  Gastrointestinal: Positive for abdominal pain. Negative for nausea, vomiting, diarrhea, constipation, blood in stool, abdominal distention and anal bleeding.  Genitourinary: Negative for dysuria, hematuria, flank pain and difficulty urinating.  Musculoskeletal: Negative for myalgias, back pain, joint swelling, arthralgias and gait problem.  Skin: Negative for color change, pallor, rash and wound.  Neurological: Negative for dizziness, tremors, weakness and light-headedness.  Hematological: Negative for adenopathy. Does not bruise/bleed easily.  Psychiatric/Behavioral: Positive for dysphoric mood. Negative for behavioral problems, confusion, sleep disturbance, decreased concentration and agitation.        Objective:   Physical Exam  Constitutional: He is oriented to person, place, and time. He appears well-developed. No distress.  HENT:  Head: Normocephalic and atraumatic.  Mouth/Throat: Posterior oropharyngeal edema present. No oropharyngeal exudate.  Eyes: Conjunctivae and EOM are normal. Pupils are equal, round, and reactive to light. No scleral icterus.  Neck: Normal range of motion. Neck supple.  Cardiovascular: Normal rate, regular rhythm and normal heart sounds.  Exam reveals no friction rub.   No murmur heard. Pulmonary/Chest: Effort normal and breath sounds normal. No respiratory distress. He has no wheezes.  Abdominal: He exhibits no distension. There is no tenderness. There is no rebound.  Musculoskeletal: He exhibits no edema and no tenderness.  Neurological: He is alert and oriented to person, place, and time. He exhibits normal muscle tone. Coordination normal.  Skin: Skin is warm and dry. He is not diaphoretic. No erythema. No pallor.  Psychiatric: He has a normal mood and affect. His behavior is normal. Judgment and thought content normal.          Assessment & Plan:   HIV:   --recheck VL and CD4 today   Thrush: resolved but high chance of recurrence  --fluconazole 200mg  daily x 14 days  Wasting: cotninue megace. ASKED HER NOT TO TAKE ENSURE AT SAME TIME AS TIVICAY  Abdominal pain; suspect this may be due to IRIS, unlikely be due to med but possible. Should push through with these meds with VERY low se profile

## 2012-08-21 ENCOUNTER — Telehealth: Payer: Self-pay | Admitting: Infectious Disease

## 2012-08-21 LAB — T-HELPER CELL (CD4) - (RCID CLINIC ONLY)
CD4 % Helper T Cell: 2 % — ABNORMAL LOW (ref 33–55)
CD4 T Cell Abs: 10 uL — ABNORMAL LOW (ref 400–2700)

## 2012-08-21 NOTE — Telephone Encounter (Signed)
pts labs DO NOT SHOW MUCH EVIDENCE OF ADHERENCE.  Can we bring her back to be seen by myself or another ID clinic md this week or next?  I want to revisit adherence and and then have her come back to clinic in the next 2 weeks for repeat labs.  Do we have bridge counselling engagment for this pt?

## 2012-08-22 NOTE — Telephone Encounter (Signed)
I called the patient and left him a message on voicemail asking him to call and schedule a f/u appointment this week or next.

## 2012-08-30 LAB — HIV-1 GENOTYPR PLUS

## 2012-09-18 ENCOUNTER — Other Ambulatory Visit: Payer: Medicaid Other

## 2012-09-19 ENCOUNTER — Other Ambulatory Visit: Payer: Self-pay | Admitting: Infectious Diseases

## 2012-09-19 ENCOUNTER — Other Ambulatory Visit: Payer: Medicaid Other

## 2012-09-19 ENCOUNTER — Other Ambulatory Visit: Payer: Self-pay | Admitting: Infectious Disease

## 2012-09-19 DIAGNOSIS — Z7251 High risk heterosexual behavior: Secondary | ICD-10-CM

## 2012-09-19 DIAGNOSIS — B20 Human immunodeficiency virus [HIV] disease: Secondary | ICD-10-CM

## 2012-09-19 DIAGNOSIS — R109 Unspecified abdominal pain: Secondary | ICD-10-CM

## 2012-09-19 DIAGNOSIS — B3781 Candidal esophagitis: Secondary | ICD-10-CM

## 2012-09-19 LAB — CBC WITH DIFFERENTIAL/PLATELET
Basophils Relative: 0 % (ref 0–1)
HCT: 45 % (ref 39.0–52.0)
Lymphocytes Relative: 16 % (ref 12–46)
Lymphs Abs: 0.4 10*3/uL — ABNORMAL LOW (ref 0.7–4.0)
MCH: 30.6 pg (ref 26.0–34.0)
MCHC: 32.9 g/dL (ref 30.0–36.0)
MCV: 93 fL (ref 78.0–100.0)
Monocytes Relative: 15 % — ABNORMAL HIGH (ref 3–12)
RDW: 14.4 % (ref 11.5–15.5)
WBC: 2.5 10*3/uL — ABNORMAL LOW (ref 4.0–10.5)

## 2012-09-20 LAB — COMPLETE METABOLIC PANEL WITH GFR
ALT: 44 U/L (ref 0–53)
AST: 66 U/L — ABNORMAL HIGH (ref 0–37)
Albumin: 3.5 g/dL (ref 3.5–5.2)
Alkaline Phosphatase: 308 U/L — ABNORMAL HIGH (ref 39–117)
Chloride: 109 mEq/L (ref 96–112)
Potassium: 3.6 mEq/L (ref 3.5–5.3)
Sodium: 146 mEq/L — ABNORMAL HIGH (ref 135–145)
Total Protein: 7.3 g/dL (ref 6.0–8.3)

## 2012-09-20 LAB — T-HELPER CELL (CD4) - (RCID CLINIC ONLY)
CD4 % Helper T Cell: 2 % — ABNORMAL LOW (ref 33–55)
CD4 T Cell Abs: 10 uL — ABNORMAL LOW (ref 400–2700)

## 2012-09-21 LAB — HIV-1 RNA QUANT-NO REFLEX-BLD
HIV 1 RNA Quant: 123622 copies/mL — ABNORMAL HIGH (ref <20)
HIV-1 RNA Quant, Log: 5.09 {Log} — ABNORMAL HIGH (ref <1.30)

## 2012-09-25 ENCOUNTER — Telehealth: Payer: Self-pay | Admitting: Infectious Disease

## 2012-09-25 DIAGNOSIS — B20 Human immunodeficiency virus [HIV] disease: Secondary | ICD-10-CM

## 2012-09-25 NOTE — Telephone Encounter (Signed)
Ok done

## 2012-09-25 NOTE — Telephone Encounter (Signed)
Mal Amabile we need  HIV integrase genotype and HIV genotype to his viral load from earlier  Really too bad and I dont know how comfortable I will be with her on this regimen if we cant know she will be compliant

## 2012-09-26 ENCOUNTER — Other Ambulatory Visit: Payer: Self-pay | Admitting: Infectious Disease

## 2012-09-26 DIAGNOSIS — B20 Human immunodeficiency virus [HIV] disease: Secondary | ICD-10-CM

## 2012-09-27 NOTE — Addendum Note (Signed)
Addended by: Mariea Clonts D on: 09/27/2012 08:51 AM   Modules accepted: Orders

## 2012-10-02 ENCOUNTER — Telehealth: Payer: Self-pay | Admitting: *Deleted

## 2012-10-02 ENCOUNTER — Ambulatory Visit: Payer: Medicaid Other | Admitting: Infectious Disease

## 2012-10-02 NOTE — Telephone Encounter (Signed)
Left message requesting pt call [facility] about further appts.  Left number to call [facility].  Pt has "No Showed" for numerous visits over the past 12 months.

## 2012-10-02 NOTE — Telephone Encounter (Signed)
Left generic message for pt to call and schedule follow up appointment, as pt no-showed today.  Checked with [facility] for active BC. Andree Coss, RN

## 2012-10-05 LAB — HIV-1 INTEGRASE GENOTYPE

## 2012-10-26 ENCOUNTER — Other Ambulatory Visit: Payer: Self-pay | Admitting: *Deleted

## 2012-10-26 DIAGNOSIS — R52 Pain, unspecified: Secondary | ICD-10-CM

## 2012-10-26 DIAGNOSIS — G4700 Insomnia, unspecified: Secondary | ICD-10-CM

## 2012-10-26 DIAGNOSIS — B20 Human immunodeficiency virus [HIV] disease: Secondary | ICD-10-CM

## 2012-10-26 MED ORDER — TRAZODONE HCL 50 MG PO TABS: 50.0000 mg | ORAL_TABLET | Freq: Every day | ORAL | Status: DC

## 2012-10-26 MED ORDER — TRAMADOL HCL 50 MG PO TABS: 50.0000 mg | ORAL_TABLET | ORAL | Status: DC

## 2012-10-26 MED ORDER — MEGESTROL ACETATE 625 MG/5ML PO SUSP: 625.0000 mg | Freq: Every day | ORAL | Status: DC

## 2012-10-26 MED ORDER — VALGANCICLOVIR HCL 450 MG PO TABS: 450.0000 mg | ORAL_TABLET | Freq: Two times a day (BID) | ORAL | Status: DC

## 2012-11-21 ENCOUNTER — Inpatient Hospital Stay (HOSPITAL_COMMUNITY): Payer: Medicaid Other

## 2012-11-21 ENCOUNTER — Inpatient Hospital Stay (HOSPITAL_COMMUNITY)
Admission: EM | Admit: 2012-11-21 | Discharge: 2012-11-23 | DRG: 974 | Disposition: A | Discharge status: Home / Self Care | Payer: Medicaid Other | Attending: Internal Medicine | Admitting: Internal Medicine

## 2012-11-21 ENCOUNTER — Encounter (HOSPITAL_COMMUNITY): Payer: Self-pay

## 2012-11-21 DIAGNOSIS — Z91199 Patient's noncompliance with other medical treatment and regimen due to unspecified reason: Secondary | ICD-10-CM

## 2012-11-21 DIAGNOSIS — R748 Abnormal levels of other serum enzymes: Secondary | ICD-10-CM

## 2012-11-21 DIAGNOSIS — R112 Nausea with vomiting, unspecified: Secondary | ICD-10-CM

## 2012-11-21 DIAGNOSIS — K7010 Alcoholic hepatitis without ascites: Secondary | ICD-10-CM

## 2012-11-21 DIAGNOSIS — F1010 Alcohol abuse, uncomplicated: Secondary | ICD-10-CM

## 2012-11-21 DIAGNOSIS — R7401 Elevation of levels of liver transaminase levels: Secondary | ICD-10-CM | POA: Diagnosis present

## 2012-11-21 DIAGNOSIS — D72819 Decreased white blood cell count, unspecified: Secondary | ICD-10-CM | POA: Diagnosis present

## 2012-11-21 DIAGNOSIS — B20 Human immunodeficiency virus [HIV] disease: Principal | ICD-10-CM

## 2012-11-21 DIAGNOSIS — Z9111 Patient's noncompliance with dietary regimen: Secondary | ICD-10-CM

## 2012-11-21 DIAGNOSIS — B370 Candidal stomatitis: Secondary | ICD-10-CM | POA: Diagnosis present

## 2012-11-21 DIAGNOSIS — E876 Hypokalemia: Secondary | ICD-10-CM | POA: Diagnosis present

## 2012-11-21 DIAGNOSIS — K859 Acute pancreatitis without necrosis or infection, unspecified: Secondary | ICD-10-CM

## 2012-11-21 DIAGNOSIS — Z9119 Patient's noncompliance with other medical treatment and regimen: Secondary | ICD-10-CM

## 2012-11-21 DIAGNOSIS — B3781 Candidal esophagitis: Secondary | ICD-10-CM

## 2012-11-21 DIAGNOSIS — F640 Transsexualism: Secondary | ICD-10-CM | POA: Diagnosis present

## 2012-11-21 DIAGNOSIS — F1020 Alcohol dependence, uncomplicated: Secondary | ICD-10-CM | POA: Diagnosis present

## 2012-11-21 DIAGNOSIS — R7402 Elevation of levels of lactic acid dehydrogenase (LDH): Secondary | ICD-10-CM | POA: Diagnosis present

## 2012-11-21 HISTORY — DX: Human immunodeficiency virus (HIV) disease: B20

## 2012-11-21 HISTORY — DX: Asymptomatic human immunodeficiency virus (hiv) infection status: Z21

## 2012-11-21 LAB — COMPREHENSIVE METABOLIC PANEL
Albumin: 3 g/dL — ABNORMAL LOW (ref 3.5–5.2)
Alkaline Phosphatase: 827 U/L — ABNORMAL HIGH (ref 39–117)
BUN: 7 mg/dL (ref 6–23)
BUN: 8 mg/dL (ref 6–23)
CO2: 28 mEq/L (ref 19–32)
Chloride: 100 mEq/L (ref 96–112)
Creatinine, Ser: 0.97 mg/dL (ref 0.50–1.35)
Creatinine, Ser: 1 mg/dL (ref 0.50–1.35)
GFR calc non Af Amer: >90 mL/min (ref >90)
Total Bilirubin: 0.6 mg/dL (ref 0.3–1.2)
Total Bilirubin: 1.1 mg/dL (ref 0.3–1.2)
Total Protein: 7.6 g/dL (ref 6.0–8.3)

## 2012-11-21 LAB — URINALYSIS, ROUTINE W REFLEX MICROSCOPIC
Ketones, ur: 15 mg/dL — AB
Leukocytes, UA: NEGATIVE
Nitrite: NEGATIVE
Specific Gravity, Urine: 1.023 (ref 1.005–1.030)
pH: 6.5 (ref 5.0–8.0)

## 2012-11-21 LAB — CBC WITH DIFFERENTIAL/PLATELET
Basophils Relative: 0 % (ref 0–1)
Eosinophils Absolute: 0 10*3/uL (ref 0.0–0.7)
Eosinophils Relative: 1 % (ref 0–5)
HCT: 46.8 % (ref 39.0–52.0)
Hemoglobin: 16 g/dL (ref 13.0–17.0)
MCH: 31.9 pg (ref 26.0–34.0)
MCHC: 34.2 g/dL (ref 30.0–36.0)
Monocytes Absolute: 0.5 10*3/uL (ref 0.1–1.0)
Monocytes Relative: 17 % — ABNORMAL HIGH (ref 3–12)

## 2012-11-21 LAB — MAGNESIUM: Magnesium: 2.1 mg/dL (ref 1.5–2.5)

## 2012-11-21 LAB — RAPID URINE DRUG SCREEN, HOSP PERFORMED
Opiates: NOT DETECTED
Tetrahydrocannabinol: POSITIVE — AB

## 2012-11-21 LAB — LIPASE, BLOOD: Lipase: 523 U/L — ABNORMAL HIGH (ref 11–59)

## 2012-11-21 LAB — ETHANOL: Alcohol, Ethyl (B): <11 mg/dL (ref 0–11)

## 2012-11-21 LAB — URINE MICROSCOPIC-ADD ON

## 2012-11-21 LAB — OCCULT BLOOD, POC DEVICE: Fecal Occult Bld: NEGATIVE

## 2012-11-21 MED ORDER — ONDANSETRON HCL 4 MG/2ML IJ SOLN
4.0000 mg | Freq: Once | INTRAMUSCULAR | Status: AC
  Administered 2012-11-21: 4 mg via INTRAVENOUS
  Filled 2012-11-21: qty 2

## 2012-11-21 MED ORDER — KCL IN DEXTROSE-NACL 20-5-0.45 MEQ/L-%-% IV SOLN
INTRAVENOUS | Status: DC
  Administered 2012-11-21 – 2012-11-23 (×4): via INTRAVENOUS
  Filled 2012-11-21 (×9): qty 1000

## 2012-11-21 MED ORDER — ONDANSETRON HCL 4 MG/2ML IJ SOLN
4.0000 mg | Freq: Four times a day (QID) | INTRAMUSCULAR | Status: DC | PRN
  Administered 2012-11-21: 4 mg via INTRAVENOUS
  Filled 2012-11-21: qty 2

## 2012-11-21 MED ORDER — ENOXAPARIN SODIUM 40 MG/0.4ML ~~LOC~~ SOLN
40.0000 mg | SUBCUTANEOUS | Status: DC
  Administered 2012-11-21 – 2012-11-23 (×3): 40 mg via SUBCUTANEOUS
  Filled 2012-11-21 (×3): qty 0.4

## 2012-11-21 MED ORDER — FENTANYL CITRATE 0.05 MG/ML IJ SOLN
50.0000 ug | Freq: Once | INTRAMUSCULAR | Status: AC
  Administered 2012-11-21: 50 ug via INTRAVENOUS
  Filled 2012-11-21: qty 2

## 2012-11-21 MED ORDER — PANTOPRAZOLE SODIUM 40 MG IV SOLR
40.0000 mg | Freq: Two times a day (BID) | INTRAVENOUS | Status: DC
  Administered 2012-11-21 – 2012-11-23 (×5): 40 mg via INTRAVENOUS
  Filled 2012-11-21 (×7): qty 40

## 2012-11-21 MED ORDER — MORPHINE SULFATE 2 MG/ML IJ SOLN
2.0000 mg | INTRAMUSCULAR | Status: DC | PRN
  Administered 2012-11-21 – 2012-11-22 (×3): 2 mg via INTRAVENOUS
  Filled 2012-11-21 (×3): qty 1

## 2012-11-21 MED ORDER — FLUCONAZOLE IN SODIUM CHLORIDE 400-0.9 MG/200ML-% IV SOLN
400.0000 mg | INTRAVENOUS | Status: DC
  Administered 2012-11-21: 400 mg via INTRAVENOUS
  Filled 2012-11-21 (×2): qty 200

## 2012-11-21 MED ORDER — AZITHROMYCIN 600 MG PO TABS
1200.0000 mg | ORAL_TABLET | ORAL | Status: DC
  Administered 2012-11-21: 1200 mg via ORAL
  Filled 2012-11-21: qty 2

## 2012-11-21 MED ORDER — ONDANSETRON HCL 4 MG/2ML IJ SOLN: 4.0000 mg | Freq: Three times a day (TID) | INTRAMUSCULAR | Status: DC | PRN

## 2012-11-21 MED ORDER — POTASSIUM CHLORIDE 10 MEQ/100ML IV SOLN
10.0000 meq | INTRAVENOUS | Status: AC
  Administered 2012-11-21 (×5): 10 meq via INTRAVENOUS
  Filled 2012-11-21 (×4): qty 100

## 2012-11-21 MED ORDER — VALGANCICLOVIR HCL 450 MG PO TABS
450.0000 mg | ORAL_TABLET | Freq: Two times a day (BID) | ORAL | Status: DC
  Administered 2012-11-21 – 2012-11-23 (×5): 450 mg via ORAL
  Filled 2012-11-21 (×6): qty 1

## 2012-11-21 MED ORDER — ONDANSETRON HCL 4 MG PO TABS: 4.0000 mg | ORAL_TABLET | Freq: Four times a day (QID) | ORAL | Status: DC | PRN

## 2012-11-21 MED ORDER — POTASSIUM CHLORIDE CRYS ER 10 MEQ PO TBCR
EXTENDED_RELEASE_TABLET | ORAL | Status: AC
  Filled 2012-11-21: qty 1

## 2012-11-21 MED ORDER — SULFAMETHOXAZOLE-TMP DS 800-160 MG PO TABS
1.0000 | ORAL_TABLET | Freq: Every day | ORAL | Status: DC
  Administered 2012-11-21 – 2012-11-23 (×3): 1 via ORAL
  Filled 2012-11-21 (×4): qty 1

## 2012-11-21 MED ORDER — POTASSIUM CHLORIDE 10 MEQ/100ML IV SOLN
INTRAVENOUS | Status: AC
  Administered 2012-11-21: 10 meq
  Filled 2012-11-21: qty 100

## 2012-11-21 NOTE — ED Provider Notes (Signed)
History    CSN: [id number] Arrival date & time 11/21/12  0806  First MD Initiated Contact with Patient 11/21/12 [phone]     Chief Complaint  Patient presents with  . Abdominal Pain   (Consider location/radiation/quality/duration/timing/severity/associated sxs/prior Treatment) HPI 32 yo transgender male to male presents to the ER with complaint of abdominal pain worsening over the last 2 weeks.  Pt reports she has been unable to keep anything down, and today began throwing up "black stuff".  Last BM a week ago.  Pt reports she drinks a 40 oz beer daily, but has been unable to for the last 3 days.  Denies h/o pancreatitis, gallbladder problems, or GERD.  Has been taking advil for pain.  No pepto or other OTC meds.  Pt with h/o HIV/AIDS, noncompliant with follow up appointments and medications.  She is vague about when she took her last AIDS medications.  +tob use.  Denies fevers, cough, rash.  Has h/o thrush, reports some thrush symptoms.  Past Medical History  Diagnosis Date  . HIV (human immunodeficiency virus infection)    No past surgical history on file. No family history on file. History  Substance Use Topics  . Smoking status: Current Every Day Smoker -- 0.50 packs/day for 5 years    Types: Cigarettes  . Smokeless tobacco: Never Used  . Alcohol Use: 7.0 oz/week    14 drink(s) per week    Review of Systems  All other systems reviewed and are negative.  other than listed in hpi  Allergies  Review of patient's allergies indicates no known allergies.  Home Medications   Current Outpatient Rx  Name  Route  Sig  Dispense  Refill  . azithromycin (ZITHROMAX) 600 MG tablet   Oral   Take 2 tablets (1,200 mg total) by mouth every 7 (seven) days.   10 tablet   11     Take two pills once weekly to prevent MAC infectio ...   . chlorhexidine (PERIDEX) 0.12 % solution      SWISH AND SPIT 1/2 CAPFUL TWICE DAILY AS DIRECTED   480 mL   1   . Dolutegravir Sodium (TIVICAY) 50  MG TABS   Oral   Take 1 tablet (50 mg total) by mouth daily.   30 tablet   11     New regimen take tivicay and truvada daily   . emtricitabine-tenofovir (TRUVADA) 200-300 MG per tablet   Oral   Take 1 tablet by mouth daily.   30 tablet   11     New regimen tivicay and truvada   . fluconazole (DIFLUCAN) 200 MG tablet   Oral   Take 1 tablet (200 mg total) by mouth daily.   14 tablet   2   . megestrol (MEGACE ES) 625 MG/5ML suspension   Oral   Take 5 mLs (625 mg total) by mouth daily.   150 mL   3   . sulfamethoxazole-trimethoprim (BACTRIM DS) 800-160 MG per tablet      Please take AT LEAST three times a WEEK, if possibly every day to prevent Pneumonia   30 tablet   11   . traMADol (ULTRAM) 50 MG tablet   Oral   Take 1 tablet (50 mg total) by mouth every 4 (four) hours.   60 tablet   3   . traZODone (DESYREL) 50 MG tablet   Oral   Take 1 tablet (50 mg total) by mouth at bedtime.   30 tablet  3   . valGANciclovir (VALCYTE) 450 MG tablet   Oral   Take 1 tablet (450 mg total) by mouth 2 (two) times daily. With a meal   60 tablet   3    BP 151/96  Pulse 95  Temp(Src) 98.3 F (36.8 C) (Oral)  Resp 16  SpO2 96% Physical Exam  Nursing note and vitals reviewed. Constitutional: He is oriented to person, place, and time.  Thin, no acute distress  HENT:  Head: Normocephalic and atraumatic.  Nose: Nose normal.  +white patches on posterior pharynx and palate  Eyes: Conjunctivae and EOM are normal. Pupils are equal, round, and reactive to light.  Neck: Normal range of motion. Neck supple. No JVD present. No tracheal deviation present. No thyromegaly present.  Cardiovascular: Normal rate, regular rhythm, normal heart sounds and intact distal pulses.  Exam reveals no gallop and no friction rub.   No murmur heard. Pulmonary/Chest: Effort normal and breath sounds normal. No stridor. No respiratory distress. He has no wheezes. He has no rales. He exhibits no  tenderness.  Abdominal: Soft. Bowel sounds are normal. He exhibits no distension and no mass. There is tenderness (Pain in epigastrium). There is no rebound and no guarding.  Genitourinary: Guaiac negative stool.  +hemorroid, no stool in vault, heme negative  Musculoskeletal: Normal range of motion. He exhibits no edema and no tenderness.  Lymphadenopathy:    He has no cervical adenopathy.  Neurological: He is alert and oriented to person, place, and time. He displays normal reflexes. He exhibits normal muscle tone. Coordination normal.  Skin: Skin is warm and dry. No rash noted. No erythema. No pallor.  Psychiatric: He has a normal mood and affect. His behavior is normal. Thought content normal.    ED Course  Procedures (including critical care time) Labs Reviewed  CBC WITH DIFFERENTIAL - Abnormal; Notable for the following:    WBC 3.0 (*)    Neutro Abs 1.6 (*)    Monocytes Relative 17 (*)    All other components within normal limits  COMPREHENSIVE METABOLIC PANEL - Abnormal; Notable for the following:    Potassium 3.0 (*)    Albumin 3.0 (*)    AST 92 (*)    ALT 57 (*)    Alkaline Phosphatase 672 (*)    All other components within normal limits  LIPASE, BLOOD - Abnormal; Notable for the following:    Lipase 523 (*)    All other components within normal limits  URINALYSIS, ROUTINE W REFLEX MICROSCOPIC - Abnormal; Notable for the following:    Color, Urine AMBER (*)    Bilirubin Urine SMALL (*)    Ketones, ur 15 (*)    Protein, ur 30 (*)    All other components within normal limits  URINE MICROSCOPIC-ADD ON  OCCULT BLOOD, POC DEVICE  SAMPLE TO BLOOD BANK   No results found. 1. Pancreatitis   2. Noncompliance with treatment plan   3. Nausea and vomiting   4. AIDS     MDM  32 yo patient noncompliant with AIDS regimen with abd pain n/v.  Broad differential, but does not appear acutely septic or overwhelming infection.  Pt with ongoing alcohol use, NSAID use.  Hemodynamically stable, no signs of blood loss anemia.  Elevated lipase, lfts.  D/w [facility] resident who requests RUQ u/s, plan for admission.  Pt updated on findings and plan.  Olivia Mackie, MD 11/21/12 1104

## 2012-11-21 NOTE — H&P (Signed)
Date: 11/21/2012               Patient Name:  Stephen Griffin MRN: [id number]  DOB: 02-02-1981 Age / Sex: 32 y.o., male   PCP: No Pcp Per Patient              Medical Service: Internal Medicine Teaching Service     I have reviewed the note by Claudine Mouton MS III and was present during the interview and physical exam.  Please see below for findings, assessment, and plan.  Chief Complaint: abdominal pain, nausea, vomiting  History of Present Illness: Stephen Griffin is a 58 year old transgender male to male with a PMH of poorly controlled HIV/AIDS, esophageal candidiasis, alcohol abuse, and medication non-compliance who presents to the [facility] ED with complaints of a week of worsening abdominal pain, nausea, and vomiting.  She states that she has been unable to keep down any food for "couple of days" and hasn't been able to drink water since yesterday.  The pain gets worse with any food and better with passing gas.  She states that she has tried ibuprofen and aleve for the pain.  "I finished a 100 pill bottle of advil over the last week for this pain."  She states that the vomit is mostly bilious and what she has had to try to eat but today she noted that it was "dark" but not coffee grounds or blood.  She states that she has not taken any of her home medications since over a week ago including her bactrim, fluconazole, or ARVs.   Meds: No Home Medications   Allergies: Allergies as of 11/21/2012  . (No Known Allergies)   Past Medical History: Medical Student note reviewed  Family History: Medical Student note reviewed  Social History: Medical Student note reviewed  Surgical History: Medical Student note reviewed  Review of System: Medical Student note reviewed  Physical Exam: Blood pressure 140/98, pulse 81, temperature 97.5 F (36.4 C), temperature source Oral, resp. rate 15, height 5\' 11"  (1.803 m), weight 130 lb (58.968 kg), SpO2 99.00%. Constitutional: Vital signs reviewed.   Patient is a well-developed and well-nourished transgender man to woman in no acute distress and cooperative with exam. Alert and oriented x3.  Head: Normocephalic and atraumatic Ear: TM normal bilaterally Nose: No erythema or drainage noted.  Turbinates normal Mouth: no erythema or exudates, MMM Eyes: PERRL, EOMI, conjunctivae normal, No scleral icterus.  Neck: Supple, Trachea midline normal ROM, No JVD, mass, thyromegaly, or carotid bruit present.  Cardiovascular: RRR, S1 normal, S2 normal, no MRG, pulses symmetric and intact bilaterally Pulmonary/Chest: normal respiratory effort, CTAB, no wheezes, rales, or rhonchi Abdominal: Soft. Moderate epigastric tenderness to palpation.  No rebound tenderness or pain with percussion.  non-distended, bowel sounds are normal, no masses, organomegaly, or guarding present.  GU: no CVA tenderness Musculoskeletal: No joint deformities, erythema, or stiffness, ROM full and no nontender Hematology: no cervical, inginal, or axillary adenopathy.  Neurological: A&O x3, Strength is normal and symmetric bilaterally, cranial nerve II-XII are grossly intact, no focal motor deficit, sensory intact to light touch bilaterally.  Skin: Warm, dry and intact. No rash, cyanosis, or clubbing.  Psychiatric: withdrawn mood and flat affect. speech is not pressured and behavior is normal. Judgment and insight are intact. Thought content normal. Cognition and memory are normal.   Labs: Reviewed as noted in the Electronic Record  Imaging: Reviewed as noted in the Electronic Record  Assessment & Plan by Problem: Ms. Kahne  Mctigue is a transgender man to woman who presents to the [facility] ED with abdominal pain, nausea, and vomiting.  1.  Abdominal pain with n/v: She presents to the ED with the above symptoms.  Her lipase is elevated as well as her Alk phos and other liver enzymes.  Differential diagnosis includes pancreatitis vs alcohol induced gastritis vs candidal esophagitis.  With  the elevated lipase and clinical signs pancreatitis seems the most likely.  ABD U/S show cholelithiasis without cholecystitis and only mildly dilated intrahepatic ducts so gallstone pancreatitis is less likely.  This is most likely alcohol induced gastritis.    - Admit to med  - NPO except sips with meds   - Morphine 2-4 mg Q4 PRN for pain  - Protonix  - Consider CT of the abd if does not improve quickly for possible intraabdominal pathology with her AIDS status.  2.  HIV/AIDS disease:  She has a history of non-compliance with ARV therapy as well as OI prophylaxis.  Last CD4 in April was 10 and she states that she hasn't been taking her medications.  She has multiple mutations and has only had a CD4 count higher then 100 once since 2008.  She is currently taking any medications.  - Start Bactrim, Azithromycin, Fluconazole, and Valacyclovir  - Set up [facility] follow up to assess for restarting home ARV  3.  Candidal esophagitis: She has thrush again as well as pain with swallowing.  We will treat with fluconazole 400 mg daily for 14 days and then will need a prophylactic regimen after.  4.  Hypokalemia: Replete as needed.  Likely secondary to vomiting and volume contraction.    5.  VTE: Lovenox  Dispo: Disposition is deferred at this time, awaiting improvement of current medical problems. Anticipated discharge in approximately 3-4 day(s).   The patient does not have a current PCP (No Pcp Per Patient) and does need an [facility] hospital follow-up appointment after discharge.  The patient does not have transportation limitations that hinder transportation to clinic appointments.  Signed: Leodis Sias, MD 11/21/2012, 4:03 PM    Yaakov Guthrie. Manson Passey 02/02/1981 [id number]   HPI  Jarrod L. Mathieson (she prefers to be called "Chocolate") is a 32 yo transgender male to male individual with PMH of poorly-controlled HIV infection, multiple opportunistic infections including PCP and esophageal  candidiasis, and alcohol abuse, who presents to [facility] ED with c/o abdominal pain and vomiting.  The abdominal pain started approximately 1 week ago, and for the past 3 days she has not been able to keep food down 2/2 nausea/vomiting. She has been drinking ~2 40-oz beers daily for the past 10 years and stopped 3 days ago with the n/v. The emesis reported by the patient is non-bloody, non-bilious. The abdominal pain is worst in the RUQ but is diffusely tender. It is a constant stabbing pain that does not radiate that is not relieved by anything. It is not alleviated or exacerbated by food. She feels dehydrated, but is still able to drink small amounts of fluids. She has not been taking NSAIDs, aspirin, Goody powder or BC powder. She denies diarrhea, fever, chills, chest pain or discomfort, worsened breathing (baseline shortness of breath), cough, palpitations, headaches, changes in vision, or dysuria/polyuria/hematuria. Patient reports constipation. Patient did not want to discuss her HIV disease, but has reportedly been non-adherent to her HAART and is not currently on any OI prophylaxis.  REVIEW OF SYSTEMS  Review of Systems  Constitutional: Positive for weight loss. Negative for fever,  chills, malaise/fatigue and diaphoresis.       Reports 10 lb unintentional weight loss over the past week.  HENT: Positive for sore throat. Negative for hearing loss, ear pain, nosebleeds, congestion, neck pain, tinnitus and ear discharge.        C/o ear itching. Sore throat, odynophagia, and globus with swallowing (chronic).   Eyes: Negative for blurred vision, double vision, photophobia, pain, discharge and redness.  Respiratory: Positive for shortness of breath. Negative for cough, hemoptysis, sputum production, wheezing and stridor.   Cardiovascular: Negative for chest pain, palpitations, orthopnea, claudication, leg swelling and PND.  Gastrointestinal: Positive for heartburn, nausea, vomiting, abdominal pain and  constipation. Negative for diarrhea, blood in stool and melena.  Genitourinary: Negative for dysuria, urgency, frequency, hematuria and flank pain.  Musculoskeletal: Negative for myalgias, back pain, joint pain and falls.  Skin: Negative for itching and rash.  Neurological: Negative for dizziness, tingling, tremors, sensory change, speech change, focal weakness, seizures, loss of consciousness, weakness and headaches.  Endo/Heme/Allergies: Negative for environmental allergies and polydipsia. Does not bruise/bleed easily.  Psychiatric/Behavioral: Positive for substance abuse.   PAST MEDICAL HISTORY  Past Medical History  Diagnosis Date  . HIV (human immunodeficiency virus infection)    Patient Active Problem List   Diagnosis Date Noted  . Alcohol abuse 11/21/2012  . Pancreatitis 11/21/2012  . Elevated alkaline phosphatase level 11/21/2012  . Transaminitis 11/21/2012  . Leukopenia 11/21/2012  . Hypokalemia 11/21/2012  . BRBPR (bright red blood per rectum) 04/11/2012  . MOLLUSCUM CONTAGIOSUM 06/24/2010  . PERIODONTAL DISEASE 05/10/2010  . INSOMNIA UNSPECIFIED 05/10/2010  . HYPOKALEMIA, MILD 03/18/2010  . Esophageal candidiasis 03/08/2010  . BLURRED VISION 03/08/2010  . NIGHT SWEATS 03/08/2010  . COUGH 03/08/2010  . WASTING SYNDROME 03/08/2010  . 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 DENTAL CARIES 09/17/2008  . CHEST PAIN, ATYPICAL 09/17/2008  . PERIPHERAL NEUROPATHY 04/09/2008  . CELLULITIS/ABSCESS NOS 06/27/2006  . HIV DISEASE 03/22/2006  . PNEUMOCYSTIS PNEUMONIA 03/22/2006  . ASTHMA 03/22/2006   SURGICAL HISTORY  History reviewed. No pertinent past surgical history.  SOCIAL HISTORY  History   Social History  . Marital Status: Single    Spouse Name: N/A    Number of Children: N/A  . Years of Education: N/A   Occupational History  . Disability    Social History Main Topics  . Smoking status: Current Every Day  Smoker -- 0.50 packs/day for 5 years    Types: Cigarettes  . Smokeless tobacco: Never Used  . Alcohol Use: 7.0 oz/week    14 drink(s) per week  . Drug Use: No  . Sexually Active: Yes -- Male partner(s)     Comment: pt. given condoms   Other Topics Concern  . Not on file   Social History Narrative   Currently living in [place] with father. Feels safe in the home; gets along with father. Is the youngest of 3 children, other siblings are medically well. Does not work; is on disability.   FAMILY HISTORY  No family history on file.  MEDICATIONS    Medication List    ASK your doctor about these medications       dolutegravir 50 MG tablet  Commonly known as:  TIVICAY  Take 1 tablet (50 mg total) by mouth daily.     emtricitabine-tenofovir 200-300 MG per tablet  Commonly known as:  TRUVADA  Take 1 tablet by mouth daily.     fluconazole 200 MG tablet  Commonly known as:  DIFLUCAN  Take 1 tablet (200 mg total) by mouth daily.     ibuprofen 200 MG tablet  Commonly known as:  ADVIL,MOTRIN  Take 600 mg by mouth every 6 (six) hours as needed for pain.     megestrol 625 MG/5ML suspension  Commonly known as:  MEGACE ES  Take 5 mLs (625 mg total) by mouth daily.     sulfamethoxazole-trimethoprim 800-160 MG per tablet  Commonly known as:  BACTRIM DS  Please take AT LEAST three times a WEEK, if possibly every day to prevent Pneumonia     traMADol 50 MG tablet  Commonly known as:  ULTRAM  Take 1 tablet (50 mg total) by mouth every 4 (four) hours.     traZODone 50 MG tablet  Commonly known as:  DESYREL  Take 1 tablet (50 mg total) by mouth at bedtime.     valGANciclovir 450 MG tablet  Commonly known as:  VALCYTE  Take 1 tablet (450 mg total) by mouth 2 (two) times daily. With a meal       PHYSICAL EXAM  Blood pressure 140/98, pulse 81, temperature 97.5 F (36.4 C), temperature source Oral, resp. rate 15, SpO2 99.00%. Physical Exam  Constitutional: He is  oriented to person, place, and time. He appears well-developed. No distress.  Thin transgender male to male individual, not cachectic.  HENT:  Head: Normocephalic and atraumatic.  Right Ear: External ear normal.  Left Ear: External ear normal.  Nose: Nose normal.  Mouth/Throat: Oropharyngeal exudate present.  Thrush on tongue, soft palate, throat with mild erythema.  Eyes: Conjunctivae and EOM are normal. Pupils are equal, round, and reactive to light. Right eye exhibits no discharge. Left eye exhibits no discharge. No scleral icterus.  Neck: Normal range of motion. Neck supple. No JVD present. No tracheal deviation present. No thyromegaly present.  Cardiovascular: Normal rate, regular rhythm and intact distal pulses.  Exam reveals no gallop and no friction rub.   Murmur heard. I/VI blowing systolic murmur best heard at left sternal border.  Respiratory: Effort normal and breath sounds normal. No stridor. No respiratory distress. He has no wheezes. He has no rales. He exhibits no tenderness.  GI: Soft. He exhibits no shifting dullness, no distension, no abdominal bruit, no ascites and no mass. There is no hepatosplenomegaly. There is tenderness. There is rebound. There is no guarding and no CVA tenderness.  Hyperactive bowel sounds in all quadrants. Rebound tenderness diffusely, but worst pain is RUQ. Two small scars on lower left abdomen, reported by patient from draining fluid from belly.  Genitourinary: Guaiac negative stool.  Musculoskeletal: He exhibits no edema.       Right foot: He exhibits no tenderness, no swelling, normal capillary refill, no deformity and no laceration.       Left foot: He exhibits no tenderness, no swelling, normal capillary refill and no laceration.  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  Lymphadenopathy:    He has no cervical adenopathy.  Neurological: He is alert and oriented to person, place, and  time. He has normal reflexes. No cranial nerve deficit or sensory deficit. He exhibits normal muscle tone. Coordination normal.  RAM normal. Sensation to light touch intact bilaterally LE, UE, and face.  Skin: Skin is warm and dry. No rash noted. He is not diaphoretic. No erythema. No pallor.  Psychiatric:  Blunted affect. Did not make good eye contact.    LABORATORY  STUDIES  Admission on 11/21/2012  Component Date Value Range Status  . WBC 11/21/2012 3.0* 4.0 - 10.5 K/uL Final  . RBC 11/21/2012 5.02  4.22 - 5.81 MIL/uL Final  . Hemoglobin 11/21/2012 16.0  13.0 - 17.0 g/dL Final  . HCT 16/02/9603 46.8  39.0 - 52.0 % Final  . MCV 11/21/2012 93.2  78.0 - 100.0 fL Final  . MCH 11/21/2012 31.9  26.0 - 34.0 pg Final  . MCHC 11/21/2012 34.2  30.0 - 36.0 g/dL Final  . RDW 54/01/8118 12.8  11.5 - 15.5 % Final  . Platelets 11/21/2012 185  150 - 400 K/uL Final  . Neutrophils Relative % 11/21/2012 55  43 - 77 % Final  . Neutro Abs 11/21/2012 1.6* 1.7 - 7.7 K/uL Final  . Lymphocytes Relative 11/21/2012 27  12 - 46 % Final  . Lymphs Abs 11/21/2012 0.8  0.7 - 4.0 K/uL Final  . Monocytes Relative 11/21/2012 17* 3 - 12 % Final  . Monocytes Absolute 11/21/2012 0.5  0.1 - 1.0 K/uL Final  . Eosinophils Relative 11/21/2012 1  0 - 5 % Final  . Eosinophils Absolute 11/21/2012 0.0  0.0 - 0.7 K/uL Final  . Basophils Relative 11/21/2012 0  0 - 1 % Final  . Basophils Absolute 11/21/2012 0.0  0.0 - 0.1 K/uL Final  . Sodium 11/21/2012 141  135 - 145 mEq/L Final  . Potassium 11/21/2012 3.0* 3.5 - 5.1 mEq/L Final  . Chloride 11/21/2012 102  96 - 112 mEq/L Final  . CO2 11/21/2012 27  19 - 32 mEq/L Final  . Glucose, Bld 11/21/2012 75  70 - 99 mg/dL Final  . BUN 14/78/2956 7  6 - 23 mg/dL Final  . Creatinine, Ser 11/21/2012 0.97  0.50 - 1.35 mg/dL Final  . Calcium 21/30/8657 8.6  8.4 - 10.5 mg/dL Final  . Total Protein 11/21/2012 7.6  6.0 - 8.3 g/dL Final  . Albumin 84/69/6295 3.0* 3.5 - 5.2 g/dL Final  . AST  28/41/3244 92* 0 - 37 U/L Final  . ALT 11/21/2012 57* 0 - 53 U/L Final  . Alkaline Phosphatase 11/21/2012 672* 39 - 117 U/L Final  . Total Bilirubin 11/21/2012 0.6  0.3 - 1.2 mg/dL Final  . GFR calc non Af Amer 11/21/2012 >90  >90 mL/min Final  . GFR calc Af Amer 11/21/2012 >90  >90 mL/min Final  . Lipase 11/21/2012 523* 11 - 59 U/L Final  . Color, Urine 11/21/2012 AMBER* YELLOW Final  . APPearance 11/21/2012 CLEAR  CLEAR Final  . Specific Gravity, Urine 11/21/2012 1.023  1.005 - 1.030 Final  . pH 11/21/2012 6.5  5.0 - 8.0 Final  . Glucose, UA 11/21/2012 NEGATIVE  NEGATIVE mg/dL Final  . Hgb urine dipstick 11/21/2012 NEGATIVE  NEGATIVE Final  . Bilirubin Urine 11/21/2012 SMALL* NEGATIVE Final  . Ketones, ur 11/21/2012 15* NEGATIVE mg/dL Final  . Protein, ur 06/01/7251 30* NEGATIVE mg/dL Final  . Urobilinogen, UA 11/21/2012 1.0  0.0 - 1.0 mg/dL Final  . Nitrite 66/44/0347 NEGATIVE  NEGATIVE Final  . Leukocytes, UA 11/21/2012 NEGATIVE  NEGATIVE Final  . Blood Bank Specimen 11/21/2012 SAMPLE AVAILABLE FOR TESTING   Final  . Sample Expiration 11/21/2012 11/22/2012   Final  . Squamous Epithelial / LPF 11/21/2012 RARE  RARE Final  . WBC, UA 11/21/2012 0-2  <3 WBC/hpf Final  . RBC / HPF 11/21/2012 0-2  <3 RBC/hpf Final  . Bacteria, UA 11/21/2012 RARE  RARE Final  . Fecal Occult Bld 11/21/2012  NEGATIVE  NEGATIVE Final   IMAGING STUDIES  COMPLETE ABDOMINAL ULTRASOUND  Comparison: Abdominal CT 11/12/2009. Findings: Gallbladder: There is a single 1 cm stone within the gallbladder. There is no gallbladder wall thickening or pericholecystic fluid. Sonographic Murphy's sign is absent. Common bile duct: Mildly dilated to 9 mm. No intraductal calculus visualized. Liver: Echogenicity is within normal limits. No focal hepatic abnormalities are identified. However, there is mild intrahepatic biliary dilatation. IVC: Visualized portions appear unremarkable. Pancreas: The pancreatic head is prominent but  partly obscured by bowel gas. No focal mass or pancreatic ductal dilatation is evident. There is no surrounding fluid collection. Spleen: Visualized portions appear unremarkable. Right Kidney: The renal cortical thickness and echogenicity are preserved. There is no hydronephrosis or focal abnormality. Renal length is 11.1 cm. Left Kidney: The renal cortical thickness and echogenicity are preserved. There is no hydronephrosis or focal abnormality. Renal  length is 11.1 cm. Abdominal aorta: Visualized portions appear unremarkable.  IMPRESSION:  1. Cholelithiasis without evidence of cholecystitis.  2. New mild biliary dilatation without evidence of  choledocholithiasis.  3. Ill-defined enlargement of the pancreatic head likely  representing pancreatitis in this patient with an elevated serum  lipase level. No focal fluid collection identified. Pancreatitis  may account for the biliary dilatation.  Original Report Authenticated By: Carey Bullocks, M.D.  Assessment/Plan "Chocolate" (Stephen Griffin) is a 57 yo transgender male to male individual with poorly-controlled HIV, h/o OI (PCP, HSV, esophageal candidiasis), and alcohol abuse, who presents with persistent abdominal pain for 1 week and now 3 days of vomiting non-bloody, non-bilious emesis. Lipase on admission 523, AlkPhos 672, and mild transaminitis are concerning for pancreatitis and cholecystitis in the setting of chronic alcohol abuse. U/S of abdomen showed cholelithiasis without evidence of cholescystitis, mild intrahepatic biliary dilatation, and enlargement of pancreatic head likely representing pancreatitis. In the setting of poor ART adherence, no OI prophylaxis, and CD4 10, we will admit patient to IM Teaching Service for medical treatment.  1. Abdominal pain/vomiting: Consistent with alcohol-induced pancreatitis, especially given Lipase 523. However, with CD4 10 it is possible this represents an OI such as MAC, CMV, or HSV. Patient denies  fever, chills, diarrhea, recent illness or sick contacts, but she has h/o esophageal candidiasis and HSV. FOBT negative. UA negative for infection. Patient has extensive oral thrush on exam with odynophagia. Other less likely etiologies include alcoholic gastritis (although this could be a complicating factor), HIV-associated enteropathy, appendicitis, small bowel obstruction, intestinal perforation, SBP, or GI malignancy (especially lymphoma). There is no ascites on exam, Murphy's sign was negative, patient denies diarrhea, and u/s did not show signs of cholecystitis. - NPO for presumed pancreatitis - Consider upper endoscopy to assess for GI infection(s) - OI prophylaxis: Bactrim, Azithromycin, Fluconazole, Valgancyclovir - FOBT negative - Obtain UDS, EtOH level - Zofran PRN nausea - Morphine PRN pain - Protonix  2. Hypokalemia: Hypokalemia to 3.0 on admission. Replete, trend BMET.  3. HIV: Unclear when last HAART was taken, not taken consistently. CD4 10. - Prophylaxis per above - Will not restart on ART while hospitalized, will need follow-up with ID clinic  This is a Medical Student Note. The care of the patient was discussed with Dr. Kem Kays and the assessment and plan formulated with their assistance. Please see their attached note for official documentation of the daily encounter.  Claudine Mouton 11/21/2012, 1:00 PM

## 2012-11-21 NOTE — ED Notes (Signed)
Pt brought to ED by EMS with abdominal pain.As per pt it has been 2 weeks since the pain has started and not been able to eat .Pt is HIV positive and on medication.Pt had alcohol 3 days back.

## 2012-11-21 NOTE — Progress Notes (Signed)
UR completed 

## 2012-11-22 DIAGNOSIS — R748 Abnormal levels of other serum enzymes: Secondary | ICD-10-CM

## 2012-11-22 LAB — COMPREHENSIVE METABOLIC PANEL
ALT: 131 U/L — ABNORMAL HIGH (ref 0–53)
AST: 223 U/L — ABNORMAL HIGH (ref 0–37)
Alkaline Phosphatase: 847 U/L — ABNORMAL HIGH (ref 39–117)
CO2: 28 mEq/L (ref 19–32)
GFR calc Af Amer: >90 mL/min (ref >90)
Glucose, Bld: 96 mg/dL (ref 70–99)
Potassium: 3.4 mEq/L — ABNORMAL LOW (ref 3.5–5.1)
Sodium: 140 mEq/L (ref 135–145)
Total Protein: 6.8 g/dL (ref 6.0–8.3)

## 2012-11-22 MED ORDER — POTASSIUM CHLORIDE CRYS ER 20 MEQ PO TBCR
30.0000 meq | EXTENDED_RELEASE_TABLET | Freq: Once | ORAL | Status: AC
  Administered 2012-11-22: 30 meq via ORAL
  Filled 2012-11-22: qty 1
  Filled 2012-11-22: qty 3
  Filled 2012-11-22: qty 1

## 2012-11-22 NOTE — Progress Notes (Signed)
Subjective: Pain improved, but still present. Pt requesting to eat.  Objective: Vital signs in last 24 hours: Filed Vitals:   11/21/12 1307 11/21/12 1615 11/21/12 2238 11/22/12 0628  BP:   136/95 130/97  Pulse:  72 82 76  Temp:   98.6 F (37 C) 98.4 F (36.9 C)  TempSrc:   Oral Oral  Resp:   16 18  Height: 5\' 11"  (1.803 m)     Weight: 130 lb (58.968 kg)     SpO2:   99% 98%   Weight change:   Intake/Output Summary (Last 24 hours) at 11/22/12 1206 Last data filed at 11/22/12 0600  Gross per 24 hour  Intake   1955 ml  Output    550 ml  Net   1405 ml   Vitals reviewed. General: Resting in bed, NAD HEENT: PERRL, EOMI, no scleral icterus Cardiac: RRR, no rubs, murmurs or gallops Pulm: Clear to auscultation bilaterally, no wheezes, rales, or rhonchi Abd: Soft, TTP in midepigastrium, nondistended, BS present Ext: Warm and well perfused, no pedal edema Neuro: Alert and oriented X3, cranial nerves II-XII grossly intact, strength and sensation to light touch equal in bilateral upper and lower extremities  Lab Results: Basic Metabolic Panel:  Recent Labs Lab 11/21/12 1619 11/22/12 0600  NA 140 140  K 3.1* 3.4*  CL 100 105  CO2 28 28  GLUCOSE 76 96  BUN 8 5*  CREATININE 1.00 1.02  CALCIUM 8.6 8.4  MG 2.1  --    Liver Function Tests:  Recent Labs Lab 11/21/12 1619 11/22/12 0600  AST 302* 223*  ALT 105* 131*  ALKPHOS 827* 847*  BILITOT 1.1 0.7  PROT 7.5 6.8  ALBUMIN 2.9* 2.7*    Recent Labs Lab 11/21/12 0835  LIPASE 523*   CBC:  Recent Labs Lab 11/21/12 0835  WBC 3.0*  NEUTROABS 1.6*  HGB 16.0  HCT 46.8  MCV 93.2  PLT 185   Urine Drug Screen: Drugs of Abuse     Component Value Date/Time   LABOPIA NONE DETECTED 11/21/2012 1450   COCAINSCRNUR POSITIVE* 11/21/2012 1450   LABBENZ NONE DETECTED 11/21/2012 1450   AMPHETMU NONE DETECTED 11/21/2012 1450   THCU POSITIVE* 11/21/2012 1450   LABBARB NONE DETECTED 11/21/2012 1450    Alcohol  Level:  Recent Labs Lab 11/21/12 1619  ETH <11   Urinalysis:  Recent Labs Lab 11/21/12 0848  COLORURINE AMBER*  LABSPEC 1.023  PHURINE 6.5  GLUCOSEU NEGATIVE  HGBUR NEGATIVE  BILIRUBINUR SMALL*  KETONESUR 15*  PROTEINUR 30*  UROBILINOGEN 1.0  NITRITE NEGATIVE  LEUKOCYTESUR NEGATIVE    Micro Results: No results found for this or any previous visit (from the past 240 hour(s)).  Studies/Results: US Abdomen Complete  11/21/2012   *RADIOLOGY REPORT*  Clinical Data:  Upper abdominal pain.  Question gallbladder pathology.  COMPLETE ABDOMINAL ULTRASOUND  Comparison:  Abdominal CT 11/12/2009.  Findings:  Gallbladder: There is a single 1 cm stone within the gallbladder. There is no gallbladder wall thickening or pericholecystic fluid. Sonographic Murphy's sign is absent.  Common bile duct:   Mildly dilated to 9 mm.  No intraductal calculus visualized.  Liver:  Echogenicity is within normal limits.  No focal hepatic abnormalities are identified.  However, there is mild intrahepatic biliary dilatation.  IVC:  Visualized portions appear unremarkable.  Pancreas:  The pancreatic head is prominent but partly obscured by bowel gas.  No focal mass or pancreatic ductal dilatation is evident.  There is no surrounding fluid  collection.  Spleen:  Visualized portions appear unremarkable.  Right Kidney:   The renal cortical thickness and echogenicity are preserved.  There is no hydronephrosis or focal abnormality. Renal length is 11.1 cm.  Left Kidney:   The renal cortical thickness and echogenicity are preserved.  There is no hydronephrosis or focal abnormality. Renal length is 11.1 cm.  Abdominal aorta:  Visualized portions appear unremarkable.  IMPRESSION:  1.  Cholelithiasis without evidence of cholecystitis. 2.  New mild biliary dilatation without evidence of choledocholithiasis. 3.  Ill-defined enlargement of the pancreatic head likely representing pancreatitis in this patient with an elevated serum  lipase level.  No focal fluid collection identified. Pancreatitis may account for the biliary dilatation.   Original Report Authenticated By: Carey Bullocks, M.D.   Medications: I have reviewed the patient's current medications. Scheduled Meds: . azithromycin  1,200 mg Oral Weekly  . enoxaparin (LOVENOX) injection  40 mg Subcutaneous Q24H  . pantoprazole (PROTONIX) IV  40 mg Intravenous Q12H  . sulfamethoxazole-trimethoprim  1 tablet Oral Daily  . valGANciclovir  450 mg Oral BID   Continuous Infusions: . dextrose 5 % and 0.45 % NaCl with KCl 20 mEq/L 100 mL/hr at 11/22/12 0213   PRN Meds:.morphine injection, ondansetron (ZOFRAN) IV, ondansetron  Assessment/Plan:  1. Abdominal pain/vomiting: Consistent with alcohol-induced pancreatitis, especially given Lipase 523. However, with CD4 10 it is possible this represents an OI such as MAC, CMV, or HSV. Patient denies fever, chills, diarrhea, recent illness or sick contacts, but she has h/o esophageal candidiasis and HSV. FOBT negative. UA negative for infection. Patient has extensive oral thrush on exam with odynophagia. Other less likely etiologies include alcoholic gastritis (although this could be a complicating factor), HIV-associated enteropathy, appendicitis, small bowel obstruction, intestinal perforation, SBP, or GI malignancy (especially lymphoma). There is no ascites on exam, Murphy's sign was negative, patient denies diarrhea, and u/s did not show signs of cholecystitis. Sx likely for alcohol induced pancreatitis. Pt made NPO and started on IVF with pain control. Requesting diet.  - Will start clears - Consider upper endoscopy to assess for GI infection(s)  - OI prophylaxis: Bactrim, Azithromycin, Fluconazole, Valgancyclovir  - Zofran PRN nausea  - Morphine PRN pain  - Protonix   2. Hypokalemia: Hypokalemia to 3.0 on admission. Replaced. 3.4, this morning. Replacing.  - Am BMP  3. HIV: Unclear when last HAART was taken, not taken  consistently. CD4 10.  - Prophylaxis per above  - Will not restart on ART while hospitalized, will need follow-up with ID clinic     Dispo: Disposition is deferred at this time, awaiting improvement of current medical problems.  Anticipated discharge in approximately 1-2 day(s).   The patient does have a current PCP (No Pcp Per Patient) and does not need an [facility] hospital follow-up appointment after discharge.  The patient does not have transportation limitations that hinder transportation to clinic appointments.  .Services Needed at time of discharge: Y = Yes, Blank = No PT:   OT:   RN:   Equipment:   Other:     LOS: 1 day   Genelle Gather, MD 11/22/2012, 12:06 PM

## 2012-11-22 NOTE — Plan of Care (Signed)
Problem: Phase I Progression Outcomes Goal: OOB as tolerated unless otherwise ordered Outcome: Not Progressing Patient does not want to get out of bed

## 2012-11-22 NOTE — H&P (Signed)
INTERNAL MEDICINE TEACHING SERVICE Attending Admission Note  Date: 11/22/2012  Patient name: KAZUKI INGLE  Medical record number: [id number]  Date of birth: 02/13/1981    I have seen and evaluated Pollie Meyer and discussed their care with the Residency Team.  31 yr. Old transgender AAM with hx AIDS (last CD4 <50), alcohol abuse, polysubstance abuse, hx esophageal candidiasis, hx non adherence, presents with N/V/abdominal pain. He admits to drinking 6-8, 40 oz? Beers per day. He states he has not been able to keep much food or fluids down. He has been taking ibuprofen and aleve for pain. Denies hematemesis, melena, hematochezia. He is hemodynamically stable. He is noted to have evidence of alcoholic hepatitis with AST/ALt ratio of 2:1. He is noted to have an elevated ALP. As a result, RUQ U/S performed which showed evidence of cholelithiasis without cholecystitis. He had some evidence of biliary dilatation but no choledocholithiasis. Pancreatic head noted to have enlargement, likely due to pancreatitis.  He was also noted to be hypokalemic.  He has epigastric tenderness, but no guarding or rebound tenderness. Treat conservatively. IVF, replace electrolytes, CIWA protocol. He states he is hungry, I would start out later today with clears to see if he tolerates. Repeat CMP in AM.   Jonah Blue, DO 6/26/20142:25 PM

## 2012-11-22 NOTE — Progress Notes (Signed)
INITIAL NUTRITION ASSESSMENT  DOCUMENTATION CODES Per approved criteria  -Underweight, malnutrition suspected   INTERVENTION: 1.  Supplements; Resource Breeze po TID, each supplement provides 250 kcal and 9 grams of protein.  NUTRITION DIAGNOSIS: Unintended wt loss related to increased metabolic demand as evidenced by HIV, 10 lbs wt change.   Monitor:  1.  Food/Beverage; pt meeting >/=90% estimated needs with tolerance. 2.  Wt/wt change; monitor trends   Reason for Assessment: MST  32 y.o. male  Admitting Dx: Pancreatitis  ASSESSMENT: Pt admitted with abdominal pain, found to have pancreatitis.  Pt with h/o EtOH use and poorly controlled HIV.  Pt states her usual wt is 140 lbs.  Currently weighs 130 lbs.  Pt states this wt change occurred over 4-6 months despite eating better prior to onset of vomiting.  Per chart review, pt wt has been variable between 128-134 lbs.  Pt reports hunger and was able to consume 75% of clear liquid lunch.   Pt not appropriate for nutrition focused physical exam.  States "you came at a bad time, I wanted to sleep" and is distracted by phone calls during visit.   RD to follow for ongoing assessment and interventions.   Lipase     Component Value Date/Time   LIPASE 523* 11/21/2012 0835    Height: Ht Readings from Last 1 Encounters:  11/21/12 5\' 11"  (1.803 m)    Weight: Wt Readings from Last 1 Encounters:  11/21/12 130 lb (58.968 kg)    Ideal Body Weight: 172 lbs  % Ideal Body Weight:  75%  Wt Readings from Last 10 Encounters:  11/21/12 130 lb (58.968 kg)  08/20/12 128 lb 12.8 oz (58.423 kg)  07/26/12 130 lb (58.968 kg)  04/11/12 133 lb 8 oz (60.555 kg)  04/06/11 134 lb (60.782 kg)  06/24/10 128 lb 8 oz (58.287 kg)  05/10/10 124 lb 12 oz (56.586 kg)  03/18/10 126 lb 12 oz (57.493 kg)  03/08/10 124 lb 12.8 oz (56.609 kg)  09/17/08 140 lb (63.504 kg)    Usual Body Weight: 130 lbs  % Usual Body Weight: 100%  BMI:  Body mass  index is 18.14 kg/(m^2).  Estimated Nutritional Needs: Kcal: 1800-2060 Protein: 85-105g Fluid: >2.0 L/day  Skin: intact  Diet Order: Clear Liquid  EDUCATION NEEDS: -Education needs addressed   Intake/Output Summary (Last 24 hours) at 11/22/12 1557 Last data filed at 11/22/12 1436  Gross per 24 hour  Intake   2015 ml  Output    975 ml  Net   1040 ml    Last BM: PTA  Labs:   Recent Labs Lab 11/21/12 0835 11/21/12 1619 11/22/12 0600  NA 141 140 140  K 3.0* 3.1* 3.4*  CL 102 100 105  CO2 27 28 28   BUN 7 8 5*  CREATININE 0.97 1.00 1.02  CALCIUM 8.6 8.6 8.4  MG  --  2.1  --   GLUCOSE 75 76 96    CBG (last 3)  No results found for this basename: GLUCAP,  in the last 72 hours  Scheduled Meds: . azithromycin  1,200 mg Oral Weekly  . enoxaparin (LOVENOX) injection  40 mg Subcutaneous Q24H  . pantoprazole (PROTONIX) IV  40 mg Intravenous Q12H  . sulfamethoxazole-trimethoprim  1 tablet Oral Daily  . valGANciclovir  450 mg Oral BID    Continuous Infusions: . dextrose 5 % and 0.45 % NaCl with KCl 20 mEq/L 100 mL/hr at 11/22/12 1211    Past Medical History  Diagnosis  Date  . HIV (human immunodeficiency virus infection)     History reviewed. No pertinent past surgical history.  Loyce Dys, MS RD LDN Clinical Inpatient Dietitian Pager: [phone] Weekend/After hours pager: [phone]

## 2012-11-23 DIAGNOSIS — K7010 Alcoholic hepatitis without ascites: Secondary | ICD-10-CM

## 2012-11-23 LAB — COMPREHENSIVE METABOLIC PANEL
AST: 108 U/L — ABNORMAL HIGH (ref 0–37)
BUN: 4 mg/dL — ABNORMAL LOW (ref 6–23)
CO2: 26 mEq/L (ref 19–32)
Calcium: 9 mg/dL (ref 8.4–10.5)
Chloride: 110 mEq/L (ref 96–112)
Creatinine, Ser: 0.92 mg/dL (ref 0.50–1.35)
GFR calc Af Amer: >90 mL/min (ref >90)
GFR calc non Af Amer: >90 mL/min (ref >90)
Glucose, Bld: 99 mg/dL (ref 70–99)
Total Bilirubin: 0.6 mg/dL (ref 0.3–1.2)

## 2012-11-23 MED ORDER — NYSTATIN 100000 UNIT/ML MT SUSP: 5.0000 mL | Freq: Four times a day (QID) | OROMUCOSAL | Status: DC

## 2012-11-23 MED ORDER — AZITHROMYCIN 600 MG PO TABS: 1200.0000 mg | ORAL_TABLET | ORAL | Status: DC

## 2012-11-23 MED ORDER — NYSTATIN 100000 UNIT/ML MT SUSP
5.0000 mL | Freq: Four times a day (QID) | OROMUCOSAL | Status: DC
  Filled 2012-11-23 (×3): qty 5

## 2012-11-23 MED ORDER — PANTOPRAZOLE SODIUM 20 MG PO TBEC: 20.0000 mg | DELAYED_RELEASE_TABLET | Freq: Every day | ORAL | Status: DC

## 2012-11-23 MED ORDER — PANTOPRAZOLE SODIUM 20 MG PO TBEC
20.0000 mg | DELAYED_RELEASE_TABLET | Freq: Every day | ORAL | Status: DC
  Filled 2012-11-23: qty 1

## 2012-11-23 NOTE — Progress Notes (Addendum)
Subjective: Pain resolved. Tolerating clears. Requesting regular diet and to go home.  Objective: Vital signs in last 24 hours: Filed Vitals:   11/22/12 1435 11/22/12 2304 11/23/12 0500 11/23/12 0541  BP: 142/103 129/91  146/97  Pulse: 78 82  79  Temp: 97.8 F (36.6 C) 98.2 F (36.8 C)  98.6 F (37 C)  TempSrc: Oral Oral  Oral  Resp: 20 18  18   Height:      Weight:   124 lb 1.9 oz (56.3 kg) 124 lb 12.8 oz (56.609 kg)  SpO2: 100% 100%  100%   Weight change: -5 lb 14.1 oz (-2.668 kg)  Intake/Output Summary (Last 24 hours) at 11/23/12 0900 Last data filed at 11/23/12 0804  Gross per 24 hour  Intake   1080 ml  Output   3275 ml  Net  -2195 ml   Vitals reviewed. General: Resting in bed, NAD HEENT: PERRL, EOMI, no scleral icterus Cardiac: RRR, no rubs, murmurs or gallops Pulm: Clear to auscultation bilaterally, no wheezes, rales, or rhonchi Abd: Soft, mild TTP in midepigastrium, nondistended, BS present Ext: Warm and well perfused, no pedal edema Neuro: Alert and oriented X3, cranial nerves II-XII grossly intact, strength and sensation to light touch equal in bilateral upper and lower extremities  Lab Results: Basic Metabolic Panel:  Recent Labs Lab 11/21/12 1619 11/22/12 0600 11/23/12 0500  NA 140 140 143  K 3.1* 3.4* 4.7  CL 100 105 110  CO2 28 28 26   GLUCOSE 76 96 99  BUN 8 5* 4*  CREATININE 1.00 1.02 0.92  CALCIUM 8.6 8.4 9.0  MG 2.1  --   --    Liver Function Tests:  Recent Labs Lab 11/22/12 0600 11/23/12 0500  AST 223* 108*  ALT 131* 102*  ALKPHOS 847* 778*  BILITOT 0.7 0.6  PROT 6.8 7.2  ALBUMIN 2.7* 2.8*    Recent Labs Lab 11/21/12 0835  LIPASE 523*   CBC:  Recent Labs Lab 11/21/12 0835  WBC 3.0*  NEUTROABS 1.6*  HGB 16.0  HCT 46.8  MCV 93.2  PLT 185   Urine Drug Screen: Drugs of Abuse     Component Value Date/Time   LABOPIA NONE DETECTED 11/21/2012 1450   COCAINSCRNUR POSITIVE* 11/21/2012 1450   LABBENZ NONE DETECTED  11/21/2012 1450   AMPHETMU NONE DETECTED 11/21/2012 1450   THCU POSITIVE* 11/21/2012 1450   LABBARB NONE DETECTED 11/21/2012 1450    Alcohol Level:  Recent Labs Lab 11/21/12 1619  ETH <11   Urinalysis:  Recent Labs Lab 11/21/12 0848  COLORURINE AMBER*  LABSPEC 1.023  PHURINE 6.5  GLUCOSEU NEGATIVE  HGBUR NEGATIVE  BILIRUBINUR SMALL*  KETONESUR 15*  PROTEINUR 30*  UROBILINOGEN 1.0  NITRITE NEGATIVE  LEUKOCYTESUR NEGATIVE    Micro Results: No results found for this or any previous visit (from the past 240 hour(s)).  Studies/Results: US Abdomen Complete  11/21/2012   *RADIOLOGY REPORT*  Clinical Data:  Upper abdominal pain.  Question gallbladder pathology.  COMPLETE ABDOMINAL ULTRASOUND  Comparison:  Abdominal CT 11/12/2009.  Findings:  Gallbladder: There is a single 1 cm stone within the gallbladder. There is no gallbladder wall thickening or pericholecystic fluid. Sonographic Murphy's sign is absent.  Common bile duct:   Mildly dilated to 9 mm.  No intraductal calculus visualized.  Liver:  Echogenicity is within normal limits.  No focal hepatic abnormalities are identified.  However, there is mild intrahepatic biliary dilatation.  IVC:  Visualized portions appear unremarkable.  Pancreas:  The  pancreatic head is prominent but partly obscured by bowel gas.  No focal mass or pancreatic ductal dilatation is evident.  There is no surrounding fluid collection.  Spleen:  Visualized portions appear unremarkable.  Right Kidney:   The renal cortical thickness and echogenicity are preserved.  There is no hydronephrosis or focal abnormality. Renal length is 11.1 cm.  Left Kidney:   The renal cortical thickness and echogenicity are preserved.  There is no hydronephrosis or focal abnormality. Renal length is 11.1 cm.  Abdominal aorta:  Visualized portions appear unremarkable.  IMPRESSION:  1.  Cholelithiasis without evidence of cholecystitis. 2.  New mild biliary dilatation without evidence of  choledocholithiasis. 3.  Ill-defined enlargement of the pancreatic head likely representing pancreatitis in this patient with an elevated serum lipase level.  No focal fluid collection identified. Pancreatitis may account for the biliary dilatation.   Original Report Authenticated By: Carey Bullocks, M.D.   Medications: I have reviewed the patient's current medications. Scheduled Meds: . azithromycin  1,200 mg Oral Weekly  . enoxaparin (LOVENOX) injection  40 mg Subcutaneous Q24H  . pantoprazole (PROTONIX) IV  40 mg Intravenous Q12H  . sulfamethoxazole-trimethoprim  1 tablet Oral Daily  . valGANciclovir  450 mg Oral BID   Continuous Infusions: . dextrose 5 % and 0.45 % NaCl with KCl 20 mEq/L 100 mL/hr at 11/22/12 1211   PRN Meds:.morphine injection, ondansetron (ZOFRAN) IV, ondansetron  Assessment/Plan:  1. Abdominal pain/vomiting: Consistent with alcohol-induced pancreatitis, especially given Lipase 523. However, with CD4 10 it is possible this represents an OI such as MAC, CMV, or HSV. Patient denies fever, chills, diarrhea, recent illness or sick contacts, but she has h/o esophageal candidiasis and HSV. FOBT negative. UA negative for infection. Patient has extensive oral thrush on exam with odynophagia. Other less likely etiologies include alcoholic gastritis (although this could be a complicating factor), HIV-associated enteropathy, appendicitis, small bowel obstruction, intestinal perforation, SBP, or GI malignancy (especially lymphoma). There is no ascites on exam, Murphy's sign was negative, patient denies diarrhea, and u/s did not show signs of cholecystitis. Sx likely for alcohol induced pancreatitis. Pt made NPO and started on IVF with pain control. On HD#2, his pain was improving, and he was requesting a diet. Clears were started and tolerated. Advanced diet today, which was well tolerated, so discharging to home today.  - Zofran ODT PRN nausea  - Protonix po   2. HIV: Unclear when  last HAART was taken, not taken consistently. CD4 10 with VL >120, 000 on 09/19/12. Started on opportunistic infection prophylaxis. Did not restart on ART while hospitalized.  - Continue OI prophylaxis: Bactrim, Azithromycin, Fluconazole, Valgancyclovir  - Will need follow-up with ID clinic   3. Oropharyngeal thrush: Thrush present on admission. He was started on fluconazole, which was changed to Nystatin swish and spit. Will d/c home on Nystatin.   4. Hypokalemia: Resolved. Hypokalemia to 3.0 on admission. Replaced. 4.7 this morning.   Dispo: D/c to home today if tolerates a regular diet.   The patient does have a current PCP (No Pcp Per Patient) and does not need an [facility] hospital follow-up appointment after discharge.  The patient does not have transportation limitations that hinder transportation to clinic appointments.  .Services Needed at time of discharge: Y = Yes, Blank = No PT:   OT:   RN:   Equipment:   Other:     LOS: 2 days   Genelle Gather, MD 11/23/2012, 9:00 AM

## 2012-11-23 NOTE — Progress Notes (Signed)
  Date: 11/23/2012  Patient name: Stephen Griffin  Medical record number: [id number]  Date of birth: 1981/03/13   This patient has been seen and the plan of care was discussed with the house staff. Please see their note for complete details. I concur with their findings with the following additions/corrections:  Feels better today. Denies abdominal pain. Tolerating diet. Discussed alcohol cessation.  Will need F/U with ID. Give nystatin for thrush. He has an alcoholic hepatitis that is improving. Will need outpatient follow up with repeat imaging of his pancreas to assure these changes are due to pancreatitis. Stable for discharge.  Jonah Blue, DO 11/23/2012, 1:55 PM

## 2012-11-23 NOTE — Discharge Summary (Signed)
Name: Stephen Griffin MRN: [id number] DOB: 1980-11-26 32 y.o. PCP: No Pcp Per Patient  Date of Admission: 11/21/2012  8:07 AM Date of Discharge: 11/23/2012 Attending Physician: Leodis Sias, MD  Discharge Diagnosis: Principal Problem:   Pancreatitis Active Problems:   HIV DISEASE   Esophageal candidiasis   Alcohol abuse   Elevated alkaline phosphatase level   Transaminitis   Leukopenia   Hypokalemia  Discharge Medications:   Medication List    ASK your doctor about these medications       dolutegravir 50 MG tablet  Commonly known as:  TIVICAY  Take 1 tablet (50 mg total) by mouth daily.     emtricitabine-tenofovir 200-300 MG per tablet  Commonly known as:  TRUVADA  Take 1 tablet by mouth daily.     fluconazole 200 MG tablet  Commonly known as:  DIFLUCAN  Take 1 tablet (200 mg total) by mouth daily.     ibuprofen 200 MG tablet  Commonly known as:  ADVIL,MOTRIN  Take 600 mg by mouth every 6 (six) hours as needed for pain.     megestrol 625 MG/5ML suspension  Commonly known as:  MEGACE ES  Take 5 mLs (625 mg total) by mouth daily.     sulfamethoxazole-trimethoprim 800-160 MG per tablet  Commonly known as:  BACTRIM DS  Please take AT LEAST three times a WEEK, if possibly every day to prevent Pneumonia     traMADol 50 MG tablet  Commonly known as:  ULTRAM  Take 1 tablet (50 mg total) by mouth every 4 (four) hours.     traZODone 50 MG tablet  Commonly known as:  DESYREL  Take 1 tablet (50 mg total) by mouth at bedtime.     valGANciclovir 450 MG tablet  Commonly known as:  VALCYTE  Take 1 tablet (450 mg total) by mouth 2 (two) times daily. With a meal        Disposition and follow-up:   [name] was discharged from [facility] in Stable condition.  At the hospital follow up visit please address:  1.  Medication compliance and EtOH and cocaine cessation/abstinence. Discuss restarting ART therapy.  2.  Labs / imaging  needed at time of follow-up: Repeat abdominal u/s after acute pancreatitis resolved to make sure there is no pancreatic head mass, as u/s on admission states that she has "ill-defined enlargement of the pancreatic head"   3.  Pending labs/ test needing follow-up: None  Follow-up Appointments:   Discharge Instructions:  Future Appointments Provider Department Dept Phone   12/12/2012 2:45 PM Randall Hiss, MD [facility] [phone]      Consultations:    Procedures Performed:  US Abdomen Complete  11/21/2012   *RADIOLOGY REPORT*  Clinical Data:  Upper abdominal pain.  Question gallbladder pathology.  COMPLETE ABDOMINAL ULTRASOUND  Comparison:  Abdominal CT 11/12/2009.  Findings:  Gallbladder: There is a single 1 cm stone within the gallbladder. There is no gallbladder wall thickening or pericholecystic fluid. Sonographic Murphy's sign is absent.  Common bile duct:   Mildly dilated to 9 mm.  No intraductal calculus visualized.  Liver:  Echogenicity is within normal limits.  No focal hepatic abnormalities are identified.  However, there is mild intrahepatic biliary dilatation.  IVC:  Visualized portions appear unremarkable.  Pancreas:  The pancreatic head is prominent but partly obscured by bowel gas.  No focal mass or pancreatic ductal dilatation is evident.  There is  no surrounding fluid collection.  Spleen:  Visualized portions appear unremarkable.  Right Kidney:   The renal cortical thickness and echogenicity are preserved.  There is no hydronephrosis or focal abnormality. Renal length is 11.1 cm.  Left Kidney:   The renal cortical thickness and echogenicity are preserved.  There is no hydronephrosis or focal abnormality. Renal length is 11.1 cm.  Abdominal aorta:  Visualized portions appear unremarkable.  IMPRESSION:  1.  Cholelithiasis without evidence of cholecystitis. 2.  New mild biliary dilatation without evidence of choledocholithiasis. 3.   Ill-defined enlargement of the pancreatic head likely representing pancreatitis in this patient with an elevated serum lipase level.  No focal fluid collection identified. Pancreatitis may account for the biliary dilatation.   Original Report Authenticated By: Carey Bullocks, M.D.   Admission  History of Present Illness: Stephen Griffin is a 19 year old transgender male to male with a PMH of poorly controlled HIV/AIDS, esophageal candidiasis, alcohol abuse, and medication non-compliance who presents to the [facility] ED with complaints of a week of worsening abdominal pain, nausea, and vomiting. She states that she has been unable to keep down any food for "couple of days" and hasn't been able to drink water since yesterday. The pain gets worse with any food and better with passing gas. She states that she has tried ibuprofen and aleve for the pain. "I finished a 100 pill bottle of advil over the last week for this pain." She states that the vomit is mostly bilious and what she has had to try to eat but today she noted that it was "dark" but not coffee grounds or blood. She states that she has not taken any of her home medications since over a week ago including her bactrim, fluconazole, or ARVs.  Review of System:  Medical Student note reviewed  Physical Exam:  Blood pressure 140/98, pulse 81, temperature 97.5 F (36.4 C), temperature source Oral, resp. rate 15, height 5\' 11"  (1.803 m), weight 130 lb (58.968 kg), SpO2 99.00%.  Constitutional: Vital signs reviewed. Patient is a well-developed and well-nourished transgender man to woman in no acute distress and cooperative with exam. Alert and oriented x3.  Head: Normocephalic and atraumatic  Ear: TM normal bilaterally  Nose: No erythema or drainage noted. Turbinates normal  Mouth: no erythema or exudates, MMM  Eyes: PERRL, EOMI, conjunctivae normal, No scleral icterus.  Neck: Supple, Trachea midline normal ROM, No JVD, mass, thyromegaly, or carotid bruit  present.  Cardiovascular: RRR, S1 normal, S2 normal, no MRG, pulses symmetric and intact bilaterally  Pulmonary/Chest: normal respiratory effort, CTAB, no wheezes, rales, or rhonchi  Abdominal: Soft. Moderate epigastric tenderness to palpation. No rebound tenderness or pain with percussion. non-distended, bowel sounds are normal, no masses, organomegaly, or guarding present.  GU: no CVA tenderness Musculoskeletal: No joint deformities, erythema, or stiffness, ROM full and no nontender Hematology: no cervical, inginal, or axillary adenopathy.  Neurological: A&O x3, Strength is normal and symmetric bilaterally, cranial nerve II-XII are grossly intact, no focal motor deficit, sensory intact to light touch bilaterally.  Skin: Warm, dry and intact. No rash, cyanosis, or clubbing.  Psychiatric: withdrawn mood and flat affect. speech is not pressured and behavior is normal. Judgment and insight are intact. Thought content normal. Cognition and memory are normal.   Hospital Course by problem list:  1. EtOH Pancreatitis: Consistent with alcohol-induced pancreatitis, especially given Lipase 523. However, with CD4 10 it is possible this represents an OI such as MAC, CMV, or HSV. Patient  denies fever, chills, diarrhea, recent illness or sick contacts, but she has h/o esophageal candidiasis and HSV. FOBT negative. UA negative for infection. Patient has extensive oral thrush on exam with odynophagia. Other less likely etiologies include alcoholic gastritis (although this could be a complicating factor), HIV-associated enteropathy, appendicitis, small bowel obstruction, intestinal perforation, SBP, or GI malignancy (especially lymphoma). There is no ascites on exam, Murphy's sign was negative, patient denies diarrhea, and u/s did not show signs of cholecystitis. Sx likely for alcohol induced pancreatitis. Pt made NPO and started on IVF with pain control. On HD#2, his pain was improving, and he was requesting a diet.  Clears were started and tolerated. Advanced diet today, which was well tolerated, so discharged to home. - Zofran ODT PRN nausea  - Protonix po   2. HIV: Unclear when last HAART was taken, not taken consistently. CD4 10 with VL >120, 000 on 09/19/12. Started on opportunistic infection prophylaxis. Did not restart on ART while hospitalized. - Continue OI prophylaxis: Bactrim, Azithromycin, Fluconazole, Valgancyclovir   - Will need follow-up with ID clinic regarding ART, as he has not been compliant with his regimen.  3. Oropharyngeal thrush: Thrush present on admission. He was started on fluconazole, which was changed to Nystatin swish and spit; due to his EtOH liver disease, changed fluconazole. Will d/c home on Nystatin.  4. Hypokalemia: Resolved. Hypokalemia to 3.0 on admission. Replaced. 4.7 on day of d/c.   Discharge Vitals:   BP 131/85  Pulse 80  Temp(Src) 98 F (36.7 C) (Oral)  Resp 18  Ht 5\' 11"  (1.803 m)  Wt 124 lb 12.8 oz (56.609 kg)  BMI 17.41 kg/m2  SpO2 100%  Discharge Labs:  Results for orders placed during the hospital encounter of 11/21/12 (from the past 24 hour(s))  COMPREHENSIVE METABOLIC PANEL     Status: Abnormal   Collection Time    11/23/12  5:00 AM      Result Value Range   Sodium 143  135 - 145 mEq/L   Potassium 4.7  3.5 - 5.1 mEq/L   Chloride 110  96 - 112 mEq/L   CO2 26  19 - 32 mEq/L   Glucose, Bld 99  70 - 99 mg/dL   BUN 4 (*) 6 - 23 mg/dL   Creatinine, Ser 1.61  0.50 - 1.35 mg/dL   Calcium 9.0  8.4 - 09.6 mg/dL   Total Protein 7.2  6.0 - 8.3 g/dL   Albumin 2.8 (*) 3.5 - 5.2 g/dL   AST 045 (*) 0 - 37 U/L   ALT 102 (*) 0 - 53 U/L   Alkaline Phosphatase 778 (*) 39 - 117 U/L   Total Bilirubin 0.6  0.3 - 1.2 mg/dL   GFR calc non Af Amer >90  >90 mL/min   GFR calc Af Amer >90  >90 mL/min    Signed: Genelle Gather, MD 11/23/2012, 2:26 PM   Time Spent on Discharge: 35 minutes Services Ordered on Discharge: None Equipment Ordered on Discharge:  None

## 2012-11-23 NOTE — Progress Notes (Signed)
Pt was given discharge orders and IV removed FU care and where to get his RX understood alll orders read back of care and s/s to call MD

## 2012-11-24 NOTE — Discharge Summary (Signed)
  Date: 11/24/2012  Patient name: Stephen Griffin  Medical record number: [id number]  Date of birth: 30-Jun-1980   This patient has been seen and the plan of care was discussed with the house staff. Please see their note for complete details. I concur with their findings and plan.  Jonah Blue, DO 11/24/2012, 2:33 PM

## 2012-12-12 ENCOUNTER — Encounter: Payer: Self-pay | Admitting: Infectious Disease

## 2012-12-12 ENCOUNTER — Ambulatory Visit (INDEPENDENT_AMBULATORY_CARE_PROVIDER_SITE_OTHER): Payer: Medicaid Other | Admitting: Infectious Disease

## 2012-12-12 VITALS — BP 148/98 | HR 80 | Temp 97.3°F | Ht 71.0 in | Wt 124.0 lb

## 2012-12-12 DIAGNOSIS — F1010 Alcohol abuse, uncomplicated: Secondary | ICD-10-CM

## 2012-12-12 DIAGNOSIS — B3781 Candidal esophagitis: Secondary | ICD-10-CM

## 2012-12-12 DIAGNOSIS — G8929 Other chronic pain: Secondary | ICD-10-CM

## 2012-12-12 DIAGNOSIS — Z7251 High risk heterosexual behavior: Secondary | ICD-10-CM

## 2012-12-12 DIAGNOSIS — R1013 Epigastric pain: Secondary | ICD-10-CM

## 2012-12-12 DIAGNOSIS — B20 Human immunodeficiency virus [HIV] disease: Secondary | ICD-10-CM

## 2012-12-12 LAB — CBC WITH DIFFERENTIAL/PLATELET
Eosinophils Absolute: 0.2 10*3/uL (ref 0.0–0.7)
Eosinophils Relative: 7 % — ABNORMAL HIGH (ref 0–5)
HCT: 42.6 % (ref 39.0–52.0)
Hemoglobin: 14.5 g/dL (ref 13.0–17.0)
Lymphocytes Relative: 21 % (ref 12–46)
Lymphs Abs: 0.5 10*3/uL — ABNORMAL LOW (ref 0.7–4.0)
MCH: 30.8 pg (ref 26.0–34.0)
MCV: 90.4 fL (ref 78.0–100.0)
Monocytes Absolute: 0.2 10*3/uL (ref 0.1–1.0)
Monocytes Relative: 10 % (ref 3–12)
RBC: 4.71 MIL/uL (ref 4.22–5.81)
WBC: 2.3 10*3/uL — ABNORMAL LOW (ref 4.0–10.5)

## 2012-12-12 MED ORDER — DOLUTEGRAVIR SODIUM 50 MG PO TABS: 50.0000 mg | ORAL_TABLET | Freq: Every day | ORAL | Status: DC

## 2012-12-12 MED ORDER — FLUCONAZOLE 100 MG PO TABS: 100.0000 mg | ORAL_TABLET | Freq: Every day | ORAL | Status: DC

## 2012-12-12 MED ORDER — EMTRICITABINE-TENOFOVIR DF 200-300 MG PO TABS: 1.0000 | ORAL_TABLET | Freq: Every day | ORAL | Status: DC

## 2012-12-12 NOTE — Progress Notes (Signed)
  Subjective:    Patient ID: Stephen Griffin, male    DOB: [birth year], 32 y.o.   MRN: [id number]  HPI  32 year old transgender pt with 180 and VL 124 k wth genotype [id number] (03-22-11) who continues to be noncompliant TIVICAY and Truvada and whom was admitted to [facility] so with apparent alcoholic pancreatitis and candidal esophagitis. She states that her Candida infection has resolved.  She continues to complain of abdominal pain but denies drinking further alcohol. She requests "pain pills". She states that the tramadol does not help. I explained that prescribing narcotics was likely worsen her abdominal symptoms by causing constipation would not solve this problem.  I exhorted her to restart antiretroviral medicines and to be compliant with them which she has not done for quite some time.  Review of Systems  Constitutional: Negative for chills, diaphoresis, activity change, appetite change, fatigue and unexpected weight change.  HENT: Negative for congestion, sore throat, rhinorrhea, sneezing, trouble swallowing and sinus pressure.   Eyes: Negative for photophobia and visual disturbance.  Respiratory: Negative for cough, chest tightness, shortness of breath, wheezing and stridor.   Cardiovascular: Negative for chest pain, palpitations and leg swelling.  Gastrointestinal: Positive for abdominal pain, diarrhea, constipation and abdominal distention. Negative for blood in stool and anal bleeding.  Genitourinary: Negative for flank pain and difficulty urinating.  Musculoskeletal: Negative for back pain, joint swelling and gait problem.  Skin: Negative for color change, pallor, rash and wound.  Neurological: Negative for dizziness, tremors, weakness and light-headedness.  Hematological: Negative for adenopathy. Does not bruise/bleed easily.  Psychiatric/Behavioral: Positive for dysphoric mood. Negative for behavioral problems, confusion, sleep disturbance, decreased  concentration and agitation.       Objective:   Physical Exam  Constitutional: He is oriented to person, place, and time. No distress.  HENT:  Head: Normocephalic and atraumatic.  Mouth/Throat: Posterior oropharyngeal edema present. No oropharyngeal exudate.  Eyes: Conjunctivae and EOM are normal. Pupils are equal, round, and reactive to light. No scleral icterus.  Neck: Normal range of motion. Neck supple.  Cardiovascular: Normal rate, regular rhythm and normal heart sounds.  Exam reveals no friction rub.   No murmur heard. Pulmonary/Chest: Effort normal and breath sounds normal. No respiratory distress. He has no wheezes.  Abdominal: He exhibits no distension. There is tenderness. There is no rebound.  Musculoskeletal: He exhibits no edema and no tenderness.  Neurological: He is alert and oriented to person, place, and time. He exhibits normal muscle tone. Coordination normal.  Skin: Skin is warm and dry. He is not diaphoretic. No erythema. No pallor.  Psychiatric: Judgment and thought content normal. His mood appears anxious.          Assessment & Plan:   HIV:   --recheck VL and CD4 today --restart Tivicay and Truvada --recheck labs in one month --if she does NOT show evidence of taking meds by appropriate drop in viral load old discontinued antiretrovirals. --Continue PCP prophylaxis and Mycobacterium avium prophylaxis.  I spent greater than 45 minutes with the patient including greater than 50% of time in face to face counsel of the patient and in coordination of their care.    Thrush: resolved but high chance of recurrence  --fluconazole 200mg  daily x 14 days has been written as when necessary medication.  Abdominal pain; not sure what is causing the abdominal pain she claims she is no longer drinking alcohol. I will check a AFB blood culture to exclude Mycobacterium avium infection.

## 2012-12-12 NOTE — Patient Instructions (Addendum)
Your new regimen is  Tivicay yellow pill once daily  Truvada one blue pill once daily  PLEASE TAKE THESE TWO PILLS EVERY SINGLE DAY  YOU ALSO NEED TO TAKE  BACTRIM ONCE DAILY TO PREVENT PCP  AND AZITHROMYCIN TWO PILLS WEEKLY TO PREVENT MAC

## 2012-12-13 ENCOUNTER — Telehealth: Payer: Self-pay | Admitting: Infectious Disease

## 2012-12-13 ENCOUNTER — Encounter (HOSPITAL_COMMUNITY): Payer: Self-pay | Admitting: General Practice

## 2012-12-13 ENCOUNTER — Inpatient Hospital Stay (HOSPITAL_COMMUNITY)
Admission: AD | Admit: 2012-12-13 | Discharge: 2012-12-19 | DRG: 969 | Disposition: A | Discharge status: Home / Self Care | Payer: Medicaid Other | Source: Ambulatory Visit | Attending: Internal Medicine | Admitting: Internal Medicine

## 2012-12-13 DIAGNOSIS — K8050 Calculus of bile duct without cholangitis or cholecystitis without obstruction: Secondary | ICD-10-CM

## 2012-12-13 DIAGNOSIS — B3781 Candidal esophagitis: Secondary | ICD-10-CM

## 2012-12-13 DIAGNOSIS — F17210 Nicotine dependence, cigarettes, uncomplicated: Secondary | ICD-10-CM | POA: Diagnosis present

## 2012-12-13 DIAGNOSIS — I10 Essential (primary) hypertension: Secondary | ICD-10-CM | POA: Diagnosis present

## 2012-12-13 DIAGNOSIS — B20 Human immunodeficiency virus [HIV] disease: Principal | ICD-10-CM

## 2012-12-13 DIAGNOSIS — F1910 Other psychoactive substance abuse, uncomplicated: Secondary | ICD-10-CM

## 2012-12-13 DIAGNOSIS — F17200 Nicotine dependence, unspecified, uncomplicated: Secondary | ICD-10-CM

## 2012-12-13 DIAGNOSIS — R64 Cachexia: Secondary | ICD-10-CM

## 2012-12-13 DIAGNOSIS — K029 Dental caries, unspecified: Secondary | ICD-10-CM

## 2012-12-13 DIAGNOSIS — Z9119 Patient's noncompliance with other medical treatment and regimen: Secondary | ICD-10-CM

## 2012-12-13 DIAGNOSIS — R109 Unspecified abdominal pain: Secondary | ICD-10-CM | POA: Diagnosis present

## 2012-12-13 DIAGNOSIS — R7401 Elevation of levels of liver transaminase levels: Secondary | ICD-10-CM

## 2012-12-13 DIAGNOSIS — F1020 Alcohol dependence, uncomplicated: Secondary | ICD-10-CM | POA: Diagnosis present

## 2012-12-13 DIAGNOSIS — Z91199 Patient's noncompliance with other medical treatment and regimen due to unspecified reason: Secondary | ICD-10-CM

## 2012-12-13 DIAGNOSIS — E876 Hypokalemia: Secondary | ICD-10-CM

## 2012-12-13 DIAGNOSIS — G4700 Insomnia, unspecified: Secondary | ICD-10-CM

## 2012-12-13 DIAGNOSIS — B081 Molluscum contagiosum: Secondary | ICD-10-CM

## 2012-12-13 DIAGNOSIS — J45909 Unspecified asthma, uncomplicated: Secondary | ICD-10-CM

## 2012-12-13 DIAGNOSIS — K8060 Calculus of gallbladder and bile duct with cholecystitis, unspecified, without obstruction: Secondary | ICD-10-CM | POA: Diagnosis present

## 2012-12-13 DIAGNOSIS — F1010 Alcohol abuse, uncomplicated: Secondary | ICD-10-CM

## 2012-12-13 DIAGNOSIS — D72819 Decreased white blood cell count, unspecified: Secondary | ICD-10-CM

## 2012-12-13 DIAGNOSIS — K056 Periodontal disease, unspecified: Secondary | ICD-10-CM

## 2012-12-13 DIAGNOSIS — Z21 Asymptomatic human immunodeficiency virus [HIV] infection status: Secondary | ICD-10-CM

## 2012-12-13 DIAGNOSIS — R61 Generalized hyperhidrosis: Secondary | ICD-10-CM

## 2012-12-13 DIAGNOSIS — Z86718 Personal history of other venous thrombosis and embolism: Secondary | ICD-10-CM

## 2012-12-13 DIAGNOSIS — R0789 Other chest pain: Secondary | ICD-10-CM

## 2012-12-13 DIAGNOSIS — G609 Hereditary and idiopathic neuropathy, unspecified: Secondary | ICD-10-CM

## 2012-12-13 DIAGNOSIS — R059 Cough, unspecified: Secondary | ICD-10-CM

## 2012-12-13 DIAGNOSIS — B59 Pneumocystosis: Secondary | ICD-10-CM

## 2012-12-13 DIAGNOSIS — B009 Herpesviral infection, unspecified: Secondary | ICD-10-CM

## 2012-12-13 DIAGNOSIS — L0291 Cutaneous abscess, unspecified: Secondary | ICD-10-CM

## 2012-12-13 DIAGNOSIS — K8064 Calculus of gallbladder and bile duct with chronic cholecystitis without obstruction: Secondary | ICD-10-CM | POA: Diagnosis present

## 2012-12-13 DIAGNOSIS — K8309 Other cholangitis: Secondary | ICD-10-CM

## 2012-12-13 DIAGNOSIS — K625 Hemorrhage of anus and rectum: Secondary | ICD-10-CM

## 2012-12-13 DIAGNOSIS — K851 Biliary acute pancreatitis without necrosis or infection: Secondary | ICD-10-CM

## 2012-12-13 DIAGNOSIS — K069 Disorder of gingiva and edentulous alveolar ridge, unspecified: Secondary | ICD-10-CM

## 2012-12-13 DIAGNOSIS — K859 Acute pancreatitis without necrosis or infection, unspecified: Secondary | ICD-10-CM

## 2012-12-13 DIAGNOSIS — H538 Other visual disturbances: Secondary | ICD-10-CM

## 2012-12-13 DIAGNOSIS — R05 Cough: Secondary | ICD-10-CM

## 2012-12-13 DIAGNOSIS — R748 Abnormal levels of other serum enzymes: Secondary | ICD-10-CM

## 2012-12-13 HISTORY — DX: Unspecified asthma, uncomplicated: J45.909

## 2012-12-13 HISTORY — DX: Candidal esophagitis: B37.81

## 2012-12-13 HISTORY — DX: Acute embolism and thrombosis of unspecified deep veins of unspecified lower extremity: I82.409

## 2012-12-13 HISTORY — DX: Pneumonia, unspecified organism: J18.9

## 2012-12-13 HISTORY — DX: Headache: R51

## 2012-12-13 HISTORY — DX: Other cholangitis: K83.09

## 2012-12-13 LAB — COMPREHENSIVE METABOLIC PANEL
Alkaline Phosphatase: 1007 U/L — ABNORMAL HIGH (ref 39–117)
BUN: 11 mg/dL (ref 6–23)
Chloride: 108 mEq/L (ref 96–112)
Creatinine, Ser: 0.9 mg/dL (ref 0.50–1.35)
GFR calc Af Amer: >90 mL/min (ref >90)
Glucose, Bld: 108 mg/dL — ABNORMAL HIGH (ref 70–99)
Potassium: 3.1 mEq/L — ABNORMAL LOW (ref 3.5–5.1)
Total Bilirubin: 0.4 mg/dL (ref 0.3–1.2)
Total Protein: 7.8 g/dL (ref 6.0–8.3)

## 2012-12-13 LAB — ETHANOL: Alcohol, Ethyl (B): <11 mg/dL (ref 0–11)

## 2012-12-13 LAB — COMPLETE METABOLIC PANEL WITH GFR
ALT: 236 U/L — ABNORMAL HIGH (ref 0–53)
AST: 535 U/L — ABNORMAL HIGH (ref 0–37)
Alkaline Phosphatase: 1088 U/L — ABNORMAL HIGH (ref 39–117)
BUN: 12 mg/dL (ref 6–23)
Calcium: 8.5 mg/dL (ref 8.4–10.5)
Chloride: 110 mEq/L (ref 96–112)
Creat: 0.92 mg/dL (ref 0.50–1.35)
Total Bilirubin: 0.7 mg/dL (ref 0.3–1.2)

## 2012-12-13 LAB — CBC
HCT: 44.9 % (ref 39.0–52.0)
Hemoglobin: 14.3 g/dL (ref 13.0–17.0)
MCHC: 31.8 g/dL (ref 30.0–36.0)
MCV: 95.5 fL (ref 78.0–100.0)

## 2012-12-13 LAB — HIV-1 RNA ULTRAQUANT REFLEX TO GENTYP+
HIV 1 RNA Quant: 164684 copies/mL — ABNORMAL HIGH (ref <20)
HIV-1 RNA Quant, Log: 5.22 {Log} — ABNORMAL HIGH (ref <1.30)

## 2012-12-13 LAB — LIPASE, BLOOD: Lipase: 244 U/L — ABNORMAL HIGH (ref 11–59)

## 2012-12-13 MED ORDER — DOLUTEGRAVIR SODIUM 50 MG PO TABS
50.0000 mg | ORAL_TABLET | Freq: Every day | ORAL | Status: DC
  Administered 2012-12-13 – 2012-12-19 (×6): 50 mg via ORAL
  Filled 2012-12-13 (×7): qty 1

## 2012-12-13 MED ORDER — NYSTATIN 100000 UNIT/ML MT SUSP
5.0000 mL | Freq: Four times a day (QID) | OROMUCOSAL | Status: DC
  Administered 2012-12-13 – 2012-12-19 (×11): 500000 [IU] via ORAL
  Filled 2012-12-13 (×26): qty 5

## 2012-12-13 MED ORDER — HEPARIN SODIUM (PORCINE) 5000 UNIT/ML IJ SOLN
5000.0000 [IU] | Freq: Three times a day (TID) | INTRAMUSCULAR | Status: DC
  Administered 2012-12-13 – 2012-12-14 (×2): 5000 [IU] via SUBCUTANEOUS
  Filled 2012-12-13 (×5): qty 1

## 2012-12-13 MED ORDER — ALBUTEROL SULFATE (5 MG/ML) 0.5% IN NEBU: 2.5000 mg | INHALATION_SOLUTION | RESPIRATORY_TRACT | Status: DC | PRN

## 2012-12-13 MED ORDER — VALGANCICLOVIR HCL 450 MG PO TABS
450.0000 mg | ORAL_TABLET | Freq: Two times a day (BID) | ORAL | Status: DC
  Administered 2012-12-13 – 2012-12-19 (×12): 450 mg via ORAL
  Filled 2012-12-13 (×15): qty 1

## 2012-12-13 MED ORDER — SULFAMETHOXAZOLE-TMP DS 800-160 MG PO TABS: 1.0000 | ORAL_TABLET | ORAL | Status: DC

## 2012-12-13 MED ORDER — EMTRICITABINE-TENOFOVIR DF 200-300 MG PO TABS
1.0000 | ORAL_TABLET | Freq: Every day | ORAL | Status: DC
  Administered 2012-12-13 – 2012-12-19 (×7): 1 via ORAL
  Filled 2012-12-13 (×7): qty 1

## 2012-12-13 MED ORDER — ONDANSETRON HCL 4 MG PO TABS: 4.0000 mg | ORAL_TABLET | Freq: Four times a day (QID) | ORAL | Status: DC | PRN

## 2012-12-13 MED ORDER — TRAZODONE HCL 50 MG PO TABS
50.0000 mg | ORAL_TABLET | Freq: Every day | ORAL | Status: DC
  Administered 2012-12-14 – 2012-12-18 (×5): 50 mg via ORAL
  Filled 2012-12-13 (×7): qty 1

## 2012-12-13 MED ORDER — FLUCONAZOLE 100 MG PO TABS
100.0000 mg | ORAL_TABLET | Freq: Every day | ORAL | Status: DC
  Filled 2012-12-13: qty 1

## 2012-12-13 MED ORDER — MEGESTROL ACETATE 400 MG/10ML PO SUSP
800.0000 mg | Freq: Every day | ORAL | Status: DC
  Administered 2012-12-13 – 2012-12-18 (×3): 800 mg via ORAL
  Filled 2012-12-13 (×7): qty 20

## 2012-12-13 MED ORDER — NICOTINE 21 MG/24HR TD PT24
21.0000 mg | MEDICATED_PATCH | Freq: Every day | TRANSDERMAL | Status: DC
  Administered 2012-12-13: 21 mg via TRANSDERMAL
  Filled 2012-12-13 (×7): qty 1

## 2012-12-13 MED ORDER — DOLUTEGRAVIR SODIUM 50 MG PO TABS
50.0000 mg | ORAL_TABLET | Freq: Every day | ORAL | Status: DC
  Filled 2012-12-13: qty 1

## 2012-12-13 MED ORDER — HYDROMORPHONE HCL PF 1 MG/ML IJ SOLN
1.0000 mg | INTRAMUSCULAR | Status: DC | PRN
  Administered 2012-12-13 – 2012-12-18 (×7): 1 mg via INTRAVENOUS
  Filled 2012-12-13 (×7): qty 1

## 2012-12-13 MED ORDER — AZITHROMYCIN 600 MG PO TABS
1200.0000 mg | ORAL_TABLET | ORAL | Status: DC
  Filled 2012-12-13: qty 2

## 2012-12-13 MED ORDER — EMTRICITABINE-TENOFOVIR DF 200-300 MG PO TABS: 1.0000 | ORAL_TABLET | Freq: Every day | ORAL | Status: DC

## 2012-12-13 MED ORDER — MEGESTROL ACETATE 625 MG/5ML PO SUSP: 625.0000 mg | Freq: Every day | ORAL | Status: DC

## 2012-12-13 MED ORDER — SODIUM CHLORIDE 0.9 % IV SOLN
INTRAVENOUS | Status: DC
  Administered 2012-12-13 (×2): via INTRAVENOUS

## 2012-12-13 MED ORDER — FLUCONAZOLE 100 MG PO TABS
100.0000 mg | ORAL_TABLET | ORAL | Status: DC
  Administered 2012-12-13: 100 mg via ORAL
  Filled 2012-12-13: qty 1

## 2012-12-13 MED ORDER — ONDANSETRON HCL 4 MG/2ML IJ SOLN
4.0000 mg | Freq: Four times a day (QID) | INTRAMUSCULAR | Status: DC | PRN
  Administered 2012-12-14 – 2012-12-17 (×2): 4 mg via INTRAVENOUS
  Filled 2012-12-13 (×2): qty 2

## 2012-12-13 MED ORDER — SULFAMETHOXAZOLE-TMP DS 800-160 MG PO TABS
1.0000 | ORAL_TABLET | ORAL | Status: DC
  Administered 2012-12-17 – 2012-12-19 (×2): 1 via ORAL
  Filled 2012-12-13 (×3): qty 1

## 2012-12-13 MED ORDER — PANTOPRAZOLE SODIUM 20 MG PO TBEC
20.0000 mg | DELAYED_RELEASE_TABLET | Freq: Every day | ORAL | Status: DC
  Administered 2012-12-13 – 2012-12-19 (×6): 20 mg via ORAL
  Filled 2012-12-13 (×7): qty 1

## 2012-12-13 NOTE — H&P (Signed)
History and Physical       Hospital Admission Note Date: 12/13/2012  Patient name: Stephen Griffin Medical record number: [id number] Date of birth: 13-Sep-1980 Age: 32 y.o. Gender: male PCP: No PCP Per Patient    Chief Complaint:  Off and on abdominal pain for last one month  HPI: Patient is a 32 year old transgender with past medical history of poorly controlled HIV/AIDS, esophageal candidiasis, alcohol abuse, nicotine abuse, noncompliance, recent history of pancreatitis, was using significant amount of ibuprofen at the time was dc'ed by IM teaching service on 11/23/2012. Patient followed up with Dr. Algis Liming yesterday and was complaining of abdominal pain. Patient had repeat LFTs done in the office and were noticed to be extremely high. Patient was called by Dr. Algis Liming at home and recommended a direct admit for further evaluation. At the time of my examination, patient has no complaints, feeling hungry. She states that she has been having off-and-on abdominal pain, worse after eating, no nausea or vomiting at this time.  Review of Systems:  Constitutional: Denies fever, chills, diaphoresis, poor appetite and fatigue.  HEENT: Denies photophobia, eye pain, redness, hearing loss, ear pain, congestion, sore throat, rhinorrhea, sneezing, mouth sores, trouble swallowing, neck pain, neck stiffness and tinnitus.   Respiratory: Denies SOB, DOE, cough, chest tightness,  and wheezing.   Cardiovascular: Denies chest pain, palpitations and leg swelling.  Gastrointestinal: Please see history of present illness  Genitourinary: Denies dysuria, urgency, frequency, hematuria, flank pain and difficulty urinating.  Musculoskeletal: Denies myalgias, back pain, joint swelling, arthralgias and gait problem.  Skin: Denies pallor, rash and wound.  Neurological: Denies dizziness, seizures, syncope, weakness, light-headedness, numbness and headaches.  Hematological:  Denies adenopathy. Easy bruising, personal or family bleeding history  Psychiatric/Behavioral: Denies suicidal ideation, mood changes, confusion, nervousness, sleep disturbance and agitation  Past Medical History: Past Medical History  Diagnosis Date  . HIV (human immunodeficiency virus infection)    No past surgical history on file.  Medications: Prior to Admission medications   Medication Sig Start Date End Date Taking? Authorizing Provider  azithromycin (ZITHROMAX) 600 MG tablet Take 2 tablets (1,200 mg total) by mouth once a week. 11/23/12  Yes Genelle Gather, MD  dolutegravir (TIVICAY) 50 MG tablet Take 1 tablet (50 mg total) by mouth daily. 12/12/12  Yes Randall Hiss, MD  emtricitabine-tenofovir (TRUVADA) 200-300 MG per tablet Take 1 tablet by mouth daily. 12/12/12  Yes Randall Hiss, MD  fluconazole (DIFLUCAN) 100 MG tablet Take 1 tablet (100 mg total) by mouth daily. 12/12/12  Yes Randall Hiss, MD  megestrol (MEGACE ES) 625 MG/5ML suspension Take 5 mLs (625 mg total) by mouth daily. 10/26/12  Yes Ginnie Smart, MD  nystatin (MYCOSTATIN) 100000 UNIT/ML suspension Take 5 mLs (500,000 Units total) by mouth 4 (four) times daily. 11/23/12  Yes Genelle Gather, MD  pantoprazole (PROTONIX) 20 MG tablet Take 1 tablet (20 mg total) by mouth daily. 11/23/12  Yes Genelle Gather, MD  sulfamethoxazole-trimethoprim (BACTRIM DS) 800-160 MG per tablet Please take AT LEAST three times a WEEK, if possibly every day to prevent Pneumonia 07/26/12  Yes Randall Hiss, MD  traZODone (DESYREL) 50 MG tablet Take 1 tablet (50 mg total) by mouth at bedtime. 10/26/12  Yes Ginnie Smart, MD  valGANciclovir (VALCYTE) 450 MG tablet Take 1 tablet (450 mg total) by mouth 2 (two) times daily. With a meal 10/26/12  Yes Ginnie Smart, MD    Allergies:  No Known Allergies  Social History:  reports that he has been smoking Cigarettes.  He has a 2.5 pack-year smoking history. He has never  used smokeless tobacco. He reports that he drinks about 7.0 ounces of alcohol per week. He reports that he does not use illicit drugs.  Family History: No family history on file.  Physical Exam: Blood pressure 131/72, pulse 82, temperature 98.2 F (36.8 C), temperature source Oral, resp. rate 18, height 5\' 11"  (1.803 m), weight 56.25 kg (124 lb 0.1 oz), SpO2 100.00%. General: Alert, awake, oriented x3, in no acute distress. HEENT: normocephalic, atraumatic, anicteric sclera, pink conjunctiva, pupils equal and reactive to light and accomodation, oropharynx clear Neck: supple, no masses or lymphadenopathy, no goiter, no bruits  Heart: Regular rate and rhythm, without murmurs, rubs or gallops. Lungs: Clear to auscultation bilaterally, no wheezing, rales or rhonchi. Abdomen: Soft, nontender, nondistended, positive bowel sounds, no masses. Extremities: No clubbing, cyanosis or edema with positive pedal pulses. Neuro: Grossly intact, no focal neurological deficits, strength 5/5 upper and lower extremities bilaterally Psych: alert and oriented x 3, normal mood and affect Skin: no rashes or lesions, warm and dry   LABS on Admission:  Basic Metabolic Panel:  Recent Labs Lab 12/12/12 1547  NA 145  K 3.9  CL 110  CO2 30  GLUCOSE 80  BUN 12  CREATININE 0.92  CALCIUM 8.5   Liver Function Tests:  Recent Labs Lab 12/12/12 1547  AST 535*  ALT 236*  ALKPHOS 1088*  BILITOT 0.7  PROT 6.8  ALBUMIN 3.4*   No results found for this basename: LIPASE, AMYLASE,  in the last 168 hours No results found for this basename: AMMONIA,  in the last 168 hours CBC:  Recent Labs Lab 12/12/12 1547  WBC 2.3*  NEUTROABS 1.4*  HGB 14.5  HCT 42.6  MCV 90.4  PLT 215   Cardiac Enzymes: No results found for this basename: CKTOTAL, CKMB, CKMBINDEX, TROPONINI,  in the last 168 hours BNP: No components found with this basename: POCBNP,  CBG: No results found for this basename: GLUCAP,  in the last  168 hours   Radiological Exams on Admission: US Abdomen Complete  11/21/2012   *RADIOLOGY REPORT*  Clinical Data:  Upper abdominal pain.  Question gallbladder pathology.  COMPLETE ABDOMINAL ULTRASOUND  Comparison:  Abdominal CT 11/12/2009.  Findings:  Gallbladder: There is a single 1 cm stone within the gallbladder. There is no gallbladder wall thickening or pericholecystic fluid. Sonographic Murphy's sign is absent.  Common bile duct:   Mildly dilated to 9 mm.  No intraductal calculus visualized.  Liver:  Echogenicity is within normal limits.  No focal hepatic abnormalities are identified.  However, there is mild intrahepatic biliary dilatation.  IVC:  Visualized portions appear unremarkable.  Pancreas:  The pancreatic head is prominent but partly obscured by bowel gas.  No focal mass or pancreatic ductal dilatation is evident.  There is no surrounding fluid collection.  Spleen:  Visualized portions appear unremarkable.  Right Kidney:   The renal cortical thickness and echogenicity are preserved.  There is no hydronephrosis or focal abnormality. Renal length is 11.1 cm.  Left Kidney:   The renal cortical thickness and echogenicity are preserved.  There is no hydronephrosis or focal abnormality. Renal length is 11.1 cm.  Abdominal aorta:  Visualized portions appear unremarkable.  IMPRESSION:  1.  Cholelithiasis without evidence of cholecystitis. 2.  New mild biliary dilatation without evidence of choledocholithiasis. 3.  Ill-defined enlargement of the pancreatic  head likely representing pancreatitis in this patient with an elevated serum lipase level.  No focal fluid collection identified. Pancreatitis may account for the biliary dilatation.   Original Report Authenticated By: Carey Bullocks, M.D.    Assessment/Plan Principal Problem:   Transaminitis with abdominal pain: Multifactorial secondary to gallstone pancreatitis, obstructive gallstone, hepatotoxicity effects from tivicay, Truvada, Bactrim HIV/AIDS  cholangiopathy - Patient had abdominal ultrasound done during the previous admission which had shown only he guesses without cholecystitis, new mild biliary dilatation and ill-defined enlargement of pancreatic head likely pancreatitis.  - Placed on clears for now, IV fluids, pain control, antiemetics. - Obtain CMET, acute hepatitis panel, discussed with gastroenterology, [facility], will defer to GI for an MRCP versus ERCP - Hold tivicay, Truvada, Bactrim  Active Problems:   HIV DISEASE: - ID consulted. For now continue the Zithromax, Valcyte, Diflucan, and nystatin - Defer to ID for HAART without hepatotoxicity     ASTHMA: - Currently stable    Alcohol abuse - Consultation on alcohol cessation, monitor closely for any alcohol withdrawal - Will check alcohol level    Nicotine dependence - Consultation on smoking cessation, will place on nicotine patch  DVT prophylaxis: Heparin subcutaneous  CODE STATUS: Full code  Further plan will depend as patient's clinical course evolves and further radiologic and laboratory data become available.   Time Spent on Admission: 1 hour  Henya Aguallo M.D. [facility] 12/13/2012, 4:56 PM Pager: [phone]  If 7PM-7AM, please contact night-coverage [email or web address] Password [email or web address]

## 2012-12-13 NOTE — Telephone Encounter (Signed)
Currently waiting on a bed, they will call the patient directly and notify me as well.

## 2012-12-13 NOTE — Telephone Encounter (Signed)
I called the patient and he did not answer, I advised him to go to the emergency room to be admitted since I was unable to confirm him coming to see one of our ID doctors at the clinic.

## 2012-12-13 NOTE — Consult Note (Addendum)
[facility]    Date of Admission:  12/13/2012          Reason for Consult: Recent pancreatitis with rising alkaline phosphatase and hepatic transaminase levels    Referring Physician: Dr. Thad Ranger  Principal Problem:   Transaminitis Active Problems:   HIV DISEASE   ASTHMA   Alcohol abuse   Pancreatitis   Elevated alkaline phosphatase level   Nicotine dependence   Abdominal pain   Polysubstance abuse   Cigarette smoker   . azithromycin  1,200 mg Oral Weekly  . fluconazole  100 mg Oral Daily  . heparin  5,000 Units Subcutaneous Q8H  . megestrol  625 mg Oral Daily  . nystatin  5 mL Oral QID  . pantoprazole  20 mg Oral Daily  . traZODone  50 mg Oral QHS  . valGANciclovir  450 mg Oral BID    Recommendations: 1. Restart Truvada and Tivicay 2. Restart trimethoprim sulfamethoxazole PCP prophylaxis 3. Change fluconazole to 100 mg weekly 4. Continue prophylactic azithromycin 5. Check lipase 6. Agree with acute hepatitis panel 7. AFB blood culture 8. Consider GI consultation and possibly followup abdominal CT scan   Assessment: A CT scan and ultrasound last month showed some enlargement of the pancreatic head and her lipase was elevated along with elevation of her liver enzymes. Yesterday she was complaining of severe abdominal pain but today she looks very comfortable and denies pain. She denies that she has been drinking alcohol recently. I will check a repeat lipase in order AFB blood cultures since she could have granulomatous hepatitis due to mycobacterial infection in the setting of advanced, untreated HIV. Although she did have gallstone on her recent ultrasound she certainly does not look like someone who has acute cholecystitis or gallstone pancreatitis. In the long run none of this will matter if she doesn't take her antiretroviral medications consistently.    HPI: Stephen Griffin is a 32 y.o. male (transgender male to male)  with poorly controlled HIV infection who was recently admitted with abdominal pain and pancreatitis. Her lipase was over 500 and the head of the pancreas was slightly enlarged an ill-defined. One gallstone was noted but there was no evidence of biliary inflammation. She admits to drinking alcohol heavily and using cocaine prior to that admission but denies any alcohol or cocaine use since discharge. She was seen in our clinic by my partner, Dr. Daiva Eves a yesterday complaining of abdominal pain and requesting narcotic pain medications. Lab work there revealed that her alkaline phosphatase and hepatic transaminases continue to increase. He been rising steadily for the past 4 months.  She currently denies any abdominal pain, nausea, vomiting, diarrhea or constipation. She says that she has had no problem eating or drinking fluids. He has not had any odynophagia. She is very hungry and wants something to eat. She does not believe she's had any fever, chills or sweats recently. She says that her weight has been stable.  She is able to name her to antiretroviral medications (Truvada and Tivicay). However she says that she misses all her medications frequently. She says that she forgets and gets distracted by family or friends. He says that all of her family and friends are aware of her infection. Does not appear that any of them help her remember to take her medications. She does not use her pillbox to organize her medications and has not set her cell phone alarm to  remind her. She does have some vague sense that her CD4 and viral load results have not been good recently. She knows that if they remain there she will get sick and could die.   Review of Systems: Pertinent items are noted in HPI.  Past Medical History  Diagnosis Date  . HIV (human immunodeficiency virus infection)     History  Substance Use Topics  . Smoking status: Current Every Day Smoker -- 0.50 packs/day for 5 years    Types: Cigarettes   . Smokeless tobacco: Never Used  . Alcohol Use: 7.0 oz/week    14 drink(s) per week    No family history on file. No Known Allergies  OBJECTIVE: Blood pressure 131/72, pulse 82, temperature 98.2 F (36.8 C), temperature source Oral, resp. rate 18, height 5\' 11"  (1.803 m), weight 56.25 kg (124 lb 0.1 oz), SpO2 100.00%. General: She appears well and in no distress Oral: Thrush or other oropharyngeal lesions Skin: No rash Lungs: Clear Cor: Regular S1 and S2 and no murmurs Abdomen: Soft with very minimal epigastric tenderness. Normal bowel sounds. No masses are palpated. Neuro: She is alert and fully oriented with normal speech and conversation Joints and extremities: Normal Mood and affect: Slightly withdrawn but otherwise normal  Lab Results  Component Value Date   WBC 2.3* 12/12/2012   HGB 14.5 12/12/2012   HCT 42.6 12/12/2012   MCV 90.4 12/12/2012   PLT 215 12/12/2012   BMET    Component Value Date/Time   NA 145 12/12/2012 1547   K 3.9 12/12/2012 1547   CL 110 12/12/2012 1547   CO2 30 12/12/2012 1547   GLUCOSE 80 12/12/2012 1547   BUN 12 12/12/2012 1547   CREATININE 0.92 12/12/2012 1547   CREATININE 0.92 11/23/2012 0500   CALCIUM 8.5 12/12/2012 1547   GFRNONAA >90 11/23/2012 0500   GFRAA >90 11/23/2012 0500   Lab Results  Component Value Date   ALT 236* 12/12/2012   AST 535* 12/12/2012   ALKPHOS 1088* 12/12/2012   BILITOT 0.7 12/12/2012   HIV 1 RNA Quant (copies/mL)  Date Value  09/19/2012 161096*  08/20/2012 045409*  07/04/2012 811914*     CD4 T Cell Abs (cmm)  Date Value  09/19/2012 10*  08/20/2012 10*  07/04/2012 10*   Microbiology: Recent Results (from the past 240 hour(s))  AFB CULTURE, BLOOD     Status: None   Collection Time    12/12/12  3:48 PM      Result Value Range Status   Preliminary Report Culture will be examined for 6 weeks before    Preliminary   Preliminary Report issuing a Final Report.   Preliminary    Cliffton Asters, MD [facility] [phone] pager   [phone] cell 12/13/2012, 5:10 PM

## 2012-12-13 NOTE — Telephone Encounter (Signed)
I got "Chocolate's" labs back today alk phosph is to >1000, AST to 534 from 108, ALT to 236 from 102.   She was thought to have etoh induced pancreatitis during last admission in late June in which she stayed for 3 days-though  her etoh level was <11.   A that time she had [place] which showed Cholelithiasis without evidence of cholecystitis.New mild biliary dilatation without evidence of choledocholithiasis and pancreatic inflammation.   I have called chocolate and told her that she needs to be admitted to the hospital.   I examined her yesterday and while she did have significant abdominal pain she did not at all appear toxic at that time and her vital signs were stable.  I feel that she is appropriate for direct admission.  I had tried to have her go back to the teaching service since they had just taken care of her a few weeks ago (albeit last month) however they Teaching service would not admit the patient directly because the patient is not taken care of [place] as a Primary Care pateitn and would be classified as unassigned and in order to be assigned to a team, [facility], [facility] etc she would need to be triaged in the ED.  I felt that ED care was NOT indicated for this pt and a waste of resources, therefore instead I have asked for the patient to be directly admitted to [facility].  Dr. Malachi Bonds from [facility] has graciously agreed to admit the patient to [facility], Team 2.  I am worried that this patient may have gallstone pancreatitis, or perhaps HIV/AIDS cholangiopathy.  I think she clearly needs a CT with contrast, IVF supportive care and very likely a GI consult as it is highly likely she may  need an ERCP for diagnostic and therapeutic reasons.   I will let my partner Dr. Orvan Falconer know of her admission.  Of note her HIV is completely uncontrolled and I only yesterday rewrote rx for ARV (TIvicay and Truvada)

## 2012-12-14 ENCOUNTER — Encounter (HOSPITAL_COMMUNITY): Payer: Self-pay | Admitting: Anesthesiology

## 2012-12-14 ENCOUNTER — Encounter (HOSPITAL_COMMUNITY)
Admission: AD | Disposition: A | Discharge status: Home / Self Care | Payer: Self-pay | Source: Ambulatory Visit | Attending: Internal Medicine

## 2012-12-14 ENCOUNTER — Inpatient Hospital Stay (HOSPITAL_COMMUNITY): Payer: Medicaid Other

## 2012-12-14 ENCOUNTER — Inpatient Hospital Stay (HOSPITAL_COMMUNITY): Payer: Medicaid Other | Admitting: Anesthesiology

## 2012-12-14 DIAGNOSIS — K859 Acute pancreatitis without necrosis or infection, unspecified: Secondary | ICD-10-CM

## 2012-12-14 DIAGNOSIS — K8050 Calculus of bile duct without cholangitis or cholecystitis without obstruction: Secondary | ICD-10-CM

## 2012-12-14 DIAGNOSIS — F1910 Other psychoactive substance abuse, uncomplicated: Secondary | ICD-10-CM

## 2012-12-14 DIAGNOSIS — K8020 Calculus of gallbladder without cholecystitis without obstruction: Secondary | ICD-10-CM

## 2012-12-14 DIAGNOSIS — R7401 Elevation of levels of liver transaminase levels: Secondary | ICD-10-CM

## 2012-12-14 HISTORY — PX: ERCP: SHX5425

## 2012-12-14 LAB — CBC
HCT: 41.9 % (ref 39.0–52.0)
Hemoglobin: 14 g/dL (ref 13.0–17.0)
MCH: 31.5 pg (ref 26.0–34.0)
MCV: 94.2 fL (ref 78.0–100.0)
RBC: 4.45 MIL/uL (ref 4.22–5.81)

## 2012-12-14 LAB — URINE CULTURE

## 2012-12-14 LAB — COMPREHENSIVE METABOLIC PANEL
AST: 185 U/L — ABNORMAL HIGH (ref 0–37)
Albumin: 2.7 g/dL — ABNORMAL LOW (ref 3.5–5.2)
Calcium: 8.7 mg/dL (ref 8.4–10.5)
Creatinine, Ser: 0.85 mg/dL (ref 0.50–1.35)
Sodium: 144 mEq/L (ref 135–145)
Total Protein: 6.9 g/dL (ref 6.0–8.3)

## 2012-12-14 LAB — T-HELPER CELL (CD4) - (RCID CLINIC ONLY)
CD4 % Helper T Cell: 1 % — ABNORMAL LOW (ref 33–55)
CD4 T Cell Abs: <10 uL — ABNORMAL LOW (ref 400–2700)

## 2012-12-14 LAB — HEPATITIS PANEL, ACUTE
Hep B C IgM: NEGATIVE
Hepatitis B Surface Ag: NEGATIVE

## 2012-12-14 LAB — PROTIME-INR: Prothrombin Time: 13.3 seconds (ref 11.6–15.2)

## 2012-12-14 SURGERY — ERCP, WITH INTERVENTION IF INDICATED
Anesthesia: General

## 2012-12-14 SURGERY — ERCP, WITH INTERVENTION IF INDICATED
Anesthesia: Monitor Anesthesia Care

## 2012-12-14 MED ORDER — IOHEXOL 300 MG/ML  SOLN
INTRAMUSCULAR | Status: DC | PRN
  Administered 2012-12-14: 40 mL via ORAL

## 2012-12-14 MED ORDER — ONDANSETRON HCL 4 MG/2ML IJ SOLN
INTRAMUSCULAR | Status: DC | PRN
  Administered 2012-12-14: 4 mg via INTRAVENOUS

## 2012-12-14 MED ORDER — LACTATED RINGERS IV SOLN
INTRAVENOUS | Status: DC | PRN
  Administered 2012-12-14 (×2): via INTRAVENOUS

## 2012-12-14 MED ORDER — LACTATED RINGERS IV SOLN
INTRAVENOUS | Status: DC
  Administered 2012-12-14 – 2012-12-16 (×3): via INTRAVENOUS

## 2012-12-14 MED ORDER — LIDOCAINE HCL 4 % MT SOLN
OROMUCOSAL | Status: DC | PRN
  Administered 2012-12-14: 4 mL via TOPICAL

## 2012-12-14 MED ORDER — MIDAZOLAM HCL 5 MG/5ML IJ SOLN
INTRAMUSCULAR | Status: DC | PRN
  Administered 2012-12-14: 2 mg via INTRAVENOUS

## 2012-12-14 MED ORDER — CIPROFLOXACIN IN D5W 400 MG/200ML IV SOLN
400.0000 mg | Freq: Once | INTRAVENOUS | Status: AC
  Administered 2012-12-14: 400 mg via INTRAVENOUS
  Filled 2012-12-14 (×2): qty 200

## 2012-12-14 MED ORDER — HYDROMORPHONE HCL PF 1 MG/ML IJ SOLN
0.2500 mg | INTRAMUSCULAR | Status: DC | PRN
  Administered 2012-12-17 (×2): 0.5 mg via INTRAVENOUS

## 2012-12-14 MED ORDER — PROMETHAZINE HCL 25 MG/ML IJ SOLN
6.2500 mg | INTRAMUSCULAR | Status: AC | PRN
  Administered 2012-12-14: 12.5 mg via INTRAVENOUS
  Filled 2012-12-14: qty 1

## 2012-12-14 MED ORDER — OXYCODONE HCL 5 MG PO TABS
5.0000 mg | ORAL_TABLET | Freq: Once | ORAL | Status: AC | PRN
  Administered 2012-12-14: 5 mg via ORAL
  Filled 2012-12-14: qty 1

## 2012-12-14 MED ORDER — SUCCINYLCHOLINE CHLORIDE 20 MG/ML IJ SOLN
INTRAMUSCULAR | Status: DC | PRN
  Administered 2012-12-14: 100 mg via INTRAVENOUS

## 2012-12-14 MED ORDER — OXYCODONE HCL 5 MG/5ML PO SOLN: 5.0000 mg | Freq: Once | ORAL | Status: AC | PRN

## 2012-12-14 MED ORDER — CIPROFLOXACIN IN D5W 400 MG/200ML IV SOLN
400.0000 mg | Freq: Once | INTRAVENOUS | Status: AC
  Administered 2012-12-14: 400 mg via INTRAVENOUS
  Filled 2012-12-14: qty 200

## 2012-12-14 MED ORDER — HYDRALAZINE HCL 20 MG/ML IJ SOLN: 10.0000 mg | INTRAMUSCULAR | Status: DC | PRN

## 2012-12-14 MED ORDER — LABETALOL HCL 5 MG/ML IV SOLN
INTRAVENOUS | Status: DC | PRN
  Administered 2012-12-14 (×2): 5 mg via INTRAVENOUS

## 2012-12-14 MED ORDER — FENTANYL CITRATE 0.05 MG/ML IJ SOLN
INTRAMUSCULAR | Status: DC | PRN
  Administered 2012-12-14: 100 ug via INTRAVENOUS

## 2012-12-14 MED ORDER — ARTIFICIAL TEARS OP OINT
TOPICAL_OINTMENT | OPHTHALMIC | Status: DC | PRN
  Administered 2012-12-14: 1 via OPHTHALMIC

## 2012-12-14 MED ORDER — PROPOFOL 10 MG/ML IV BOLUS
INTRAVENOUS | Status: DC | PRN
  Administered 2012-12-14: 150 mg via INTRAVENOUS
  Administered 2012-12-14: 50 mg via INTRAVENOUS

## 2012-12-14 NOTE — Transfer of Care (Signed)
Immediate Anesthesia Transfer of Care Note  Patient: Stephen Griffin  Procedure(s) Performed: Procedure(s): ENDOSCOPIC RETROGRADE CHOLANGIOPANCREATOGRAPHY (ERCP) (N/A)  Patient Location: PACU  Anesthesia Type:General  Level of Consciousness: awake, alert , oriented and patient cooperative  Airway & Oxygen Therapy: Patient Spontanous Breathing and Patient connected to nasal cannula oxygen  Post-op Assessment: Report given to PACU RN and Post -op Vital signs reviewed and stable  Post vital signs: Reviewed and stable  Complications: No apparent anesthesia complications

## 2012-12-14 NOTE — Consult Note (Signed)
Reason for Consult:Biliary pancreatitis, consideration for cholecystectomy Referring Physician: [name] is an 32 y.o. male.  HPI: Patient has a history of HIV. He was admitted last month with pancreatitis which was likely biliary in etiology. After discharge, he continued to have some abdominal pain. It returned and was much worse 2 days ago and was admitted to the hospital again. At that time, transaminases were elevated as well. He underwent ERCP by Dr. Arlyce Dice today. Multiple stones were removed from his common bile duct.Last lipase was 244. After the procedure, he says he feels better. His abdominal pain has resolved. He denies nausea at this time.  Past Medical History  Diagnosis Date  . HIV (human immunodeficiency virus infection)   . DVT (deep venous thrombosis)     "LLE" (12/13/2012)  . Asthma   . Pneumonia     "once" (12/13/2012)  . ZOXWRUEA(540.9)     "weekly" (12/13/2012)  . Esophageal candidiasis     [name] 12/13/2012    Past Surgical History  Procedure Laterality Date  . 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 Incision and drainage of wound Left 03/2009    index finger  . Incise and drain abcess Left 2008    groin; psoas intraabdominal/notes 09/07/2006 (12/13/2012)    History reviewed. No pertinent family history.  Social History:  reports that he has been smoking Cigarettes.  He has a 7 pack-year smoking history. He has never used smokeless tobacco. He reports that he drinks about 28.2 ounces of alcohol per week. He reports that he uses illicit drugs (Marijuana).  Allergies: No Known Allergies  Medications:  Prior to Admission:  Prescriptions prior to admission  Medication Sig Dispense Refill  . azithromycin (ZITHROMAX) 600 MG tablet Take 2 tablets (1,200 mg total) by mouth once a week.  10 tablet  11  . dolutegravir (TIVICAY) 50 MG tablet Take 1 tablet (50 mg total) by mouth daily.  30 tablet  11  . emtricitabine-tenofovir (TRUVADA) 200-300  MG per tablet Take 1 tablet by mouth daily.  30 tablet  11  . fluconazole (DIFLUCAN) 100 MG tablet Take 1 tablet (100 mg total) by mouth daily.  15 tablet  4  . megestrol (MEGACE ES) 625 MG/5ML suspension Take 5 mLs (625 mg total) by mouth daily.  150 mL  3  . nystatin (MYCOSTATIN) 100000 UNIT/ML suspension Take 5 mLs (500,000 Units total) by mouth 4 (four) times daily.  60 mL  1  . pantoprazole (PROTONIX) 20 MG tablet Take 1 tablet (20 mg total) by mouth daily.  30 tablet  6  . sulfamethoxazole-trimethoprim (BACTRIM DS) 800-160 MG per tablet Please take AT LEAST three times a WEEK, if possibly every day to prevent Pneumonia  30 tablet  11  . traZODone (DESYREL) 50 MG tablet Take 1 tablet (50 mg total) by mouth at bedtime.  30 tablet  3  . valGANciclovir (VALCYTE) 450 MG tablet Take 1 tablet (450 mg total) by mouth 2 (two) times daily. With a meal  60 tablet  3    Results for orders placed during the hospital encounter of 12/13/12 (from the past 48 hour(s))  CBC     Status: Abnormal   Collection Time    12/13/12  7:15 PM      Result Value Range   WBC 2.7 (*) 4.0 - 10.5 K/uL   RBC 4.70  4.22 - 5.81 MIL/uL   Hemoglobin 14.3  13.0 - 17.0 g/dL  HCT 44.9  39.0 - 52.0 %   MCV 95.5  78.0 - 100.0 fL   MCH 30.4  26.0 - 34.0 pg   MCHC 31.8  30.0 - 36.0 g/dL   RDW 11.9  14.7 - 82.9 %   Platelets 218  150 - 400 K/uL  COMPREHENSIVE METABOLIC PANEL     Status: Abnormal   Collection Time    12/13/12  7:15 PM      Result Value Range   Sodium 144  135 - 145 mEq/L   Potassium 3.1 (*) 3.5 - 5.1 mEq/L   Chloride 108  96 - 112 mEq/L   CO2 30  19 - 32 mEq/L   Glucose, Bld 108 (*) 70 - 99 mg/dL   BUN 11  6 - 23 mg/dL   Creatinine, Ser 5.62  0.50 - 1.35 mg/dL   Calcium 8.7  8.4 - 13.0 mg/dL   Total Protein 7.8  6.0 - 8.3 g/dL   Albumin 3.2 (*) 3.5 - 5.2 g/dL   AST 865 (*) 0 - 37 U/L   ALT 170 (*) 0 - 53 U/L   Alkaline Phosphatase 1007 (*) 39 - 117 U/L   Total Bilirubin 0.4  0.3 - 1.2 mg/dL   GFR  calc non Af Amer >90  >90 mL/min   GFR calc Af Amer >90  >90 mL/min   Comment:            The eGFR has been calculated     using the CKD EPI equation.     This calculation has not been     validated in all clinical     situations.     eGFR's persistently     <90 mL/min signify     possible Chronic Kidney Disease.  LIPASE, BLOOD     Status: Abnormal   Collection Time    12/13/12  7:15 PM      Result Value Range   Lipase 244 (*) 11 - 59 U/L  HEPATITIS PANEL, ACUTE     Status: None   Collection Time    12/13/12  7:15 PM      Result Value Range   Hepatitis B Surface Ag NEGATIVE  NEGATIVE   HCV Ab NEGATIVE  NEGATIVE   Hep A IgM NEGATIVE  NEGATIVE   Hep B C IgM NEGATIVE  NEGATIVE   Comment: (NOTE)     High levels of Hepatitis B Core IgM antibody are detectable     during the acute stage of Hepatitis B. This antibody is used     to differentiate current from past HBV infection.  ETHANOL     Status: None   Collection Time    12/13/12  7:15 PM      Result Value Range   Alcohol, Ethyl (B) <11  0 - 11 mg/dL   Comment:            LOWEST DETECTABLE LIMIT FOR     SERUM ALCOHOL IS 11 mg/dL     FOR MEDICAL PURPOSES ONLY  AFB CULTURE, BLOOD     Status: None   Collection Time    12/13/12  7:15 PM      Result Value Range   Specimen Description BLOOD LEFT ANTECUBITAL     Special Requests AFB ONLY/5CC     Culture       Value: CULTURE WILL BE EXAMINED FOR 6 WEEKS BEFORE ISSUING A FINAL REPORT   Report Status PENDING    COMPREHENSIVE METABOLIC PANEL  Status: Abnormal   Collection Time    12/14/12  4:30 AM      Result Value Range   Sodium 144  135 - 145 mEq/L   Potassium 3.6  3.5 - 5.1 mEq/L   Chloride 110  96 - 112 mEq/L   CO2 29  19 - 32 mEq/L   Glucose, Bld 78  70 - 99 mg/dL   BUN 8  6 - 23 mg/dL   Creatinine, Ser 1.61  0.50 - 1.35 mg/dL   Calcium 8.7  8.4 - 09.6 mg/dL   Total Protein 6.9  6.0 - 8.3 g/dL   Albumin 2.7 (*) 3.5 - 5.2 g/dL   AST 045 (*) 0 - 37 U/L   ALT 164  (*) 0 - 53 U/L   Alkaline Phosphatase 891 (*) 39 - 117 U/L   Total Bilirubin 0.3  0.3 - 1.2 mg/dL   GFR calc non Af Amer >90  >90 mL/min   GFR calc Af Amer >90  >90 mL/min   Comment:            The eGFR has been calculated     using the CKD EPI equation.     This calculation has not been     validated in all clinical     situations.     eGFR's persistently     <90 mL/min signify     possible Chronic Kidney Disease.  CBC     Status: Abnormal   Collection Time    12/14/12  4:30 AM      Result Value Range   WBC 3.4 (*) 4.0 - 10.5 K/uL   RBC 4.45  4.22 - 5.81 MIL/uL   Hemoglobin 14.0  13.0 - 17.0 g/dL   HCT 40.9  81.1 - 91.4 %   MCV 94.2  78.0 - 100.0 fL   MCH 31.5  26.0 - 34.0 pg   MCHC 33.4  30.0 - 36.0 g/dL   RDW 78.2  95.6 - 21.3 %   Platelets 198  150 - 400 K/uL  PROTIME-INR     Status: None   Collection Time    12/14/12 11:00 AM      Result Value Range   Prothrombin Time 13.3  11.6 - 15.2 seconds   INR 1.03  0.00 - 1.49    No results found.  Review of Systems  Unable to perform ROS: other  Please see GI elements and history of present illness, patient was otherwise uncooperative with further review of systems  Blood pressure 148/104, pulse 78, temperature 97 F (36.1 C), temperature source Oral, resp. rate 14, height 5\' 11"  (1.803 m), weight 56.25 kg (124 lb 0.1 oz), SpO2 100.00%. Physical Exam  Constitutional: He is oriented to person, place, and time. He appears well-developed and well-nourished. No distress.  HENT:  Head: Normocephalic and atraumatic.  Nose: Nose normal.  Mouth/Throat: No oropharyngeal exudate.  Eyes: EOM are normal. Pupils are equal, round, and reactive to light.  Neck: Normal range of motion. Neck supple. No tracheal deviation present.  Cardiovascular: Normal rate, regular rhythm, normal heart sounds and intact distal pulses.   Respiratory: Effort normal and breath sounds normal. No stridor. No respiratory distress. He has no wheezes. He has  no rales.  GI: Soft. He exhibits no distension. There is no tenderness. There is no rebound and no guarding.  Musculoskeletal:  Status post partial amputation of left index finger  Neurological: He is alert and oriented to person, place, and time.  He exhibits normal muscle tone.  Skin: Skin is warm.  Psychiatric: He has a normal mood and affect.    Assessment/Plan: Biliary pancreatitis now status post ERCP. I offered cholecystectomy this admission. The patient is very reluctant to have surgery at this time. He states he does not want to have to go through that. I advised him of the danger of recurrent pancreatitis. He wants to think it over. We will make him n.p.o. after midnight. My partners will speak with him again tomorrow. We will also need to check his lipase in the a.m. to make sure that continues to improve.  Stephen Griffin E 12/14/2012, 6:12 PM

## 2012-12-14 NOTE — Progress Notes (Signed)
[facility] PROGRESS NOTE  Stephen Griffin ZOX:[id number] DOB: 12-Aug-1980 DOA: 12/13/2012 PCP: No PCP Per Patient  Brief narrative: 32 year old transgender with past medical history of poorly controlled HIV/AIDS, esophageal candidiasis, alcohol abuse, nicotine abuse, noncompliance,  discharged from  [facility] 3 weeks back after admitted for etoh pancreatitis, Patient followed up with Dr. Algis Liming on 7/16 and was complaining of abdominal pain. Patient had repeat LFTs done in the office and were noticed to be extremely high and sent to the hospital for further w/up.  Assessment/Plan: Transaminitis with abdominal pain: Possibly biliary pancreatitis vs acute hepatitis vs  hepatotoxicity from tivicay, Truvada, Bactrim vs HIV/AIDS cholangiopathy . Also had elevated lipase on presentation. transaminitis slowly improving - Patient had abdominal ultrasound done during the previous admission which had shown cholelithiasis without cholecystitis, new mild biliary dilatation and ill-defined enlargement of pancreatic head likely pancreatitis.  -on clears. Continue  IV fluids, pain control and antiemetics.  Appreciate GI eval. Plan for ERCP this afternoon.   Active Problems:  HIV AIDS CD4 of 10 only. Patient non compliant with meds. Appreciate ID recs. Acute hepatitis panel sent - ID consulted. Resumed ART. Bactrim and azithro for prophylaxis. AFB blood cx sent   Alcohol abuse  -counseled on etoh cessation. No signs of  withdrawal  Tobacco abuse  counseled on cessations. On nicotine patch  DVT prophylaxis: Heparin subcutaneous   CODE STATUS: Full code   Code Status: full Family Communication: none at bedside Disposition Plan: home once stable   Consultants:  ID  [email or web address]  Procedures: For ERCP today  Antibiotics:  Bactrim and azithromycin for prophylaxis  HPI/Subjective: Reports some epigastric pain. No N/V  Objective: Filed Vitals:   12/13/12 1855 12/13/12  2026 12/14/12 0027 12/14/12 0526  BP: 139/91 132/78 142/87 137/99  Pulse: 75 81 77 73  Temp: 97.7 F (36.5 C) 98 F (36.7 C) 97.3 F (36.3 C) 97.8 F (36.6 C)  TempSrc: Oral Oral Oral Oral  Resp: 18 20 16 18   Height:      Weight:      SpO2: 100% 100% 100% 100%    Intake/Output Summary (Last 24 hours) at 12/14/12 1107 Last data filed at 12/14/12 0842  Gross per 24 hour  Intake    360 ml  Output    750 ml  Net   -390 ml   Filed Weights   12/13/12 1600  Weight: 56.25 kg (124 lb 0.1 oz)    Exam:   General:  Middle aged male in NAD  HEENT: no pallor, moist mucosa, no oral thrush  Chest: clear b/l, no added sounds  CVS: NS1&S2, no murmurs, rubs or gallop  ABD: soft,mild epigastric tenderness  EXT: warm, no edema  CNS: AAOX3   Data Reviewed: Basic Metabolic Panel:  Recent Labs Lab 12/12/12 1547 12/13/12 1915 12/14/12 0430  NA 145 144 144  K 3.9 3.1* 3.6  CL 110 108 110  CO2 30 30 29   GLUCOSE 80 108* 78  BUN 12 11 8   CREATININE 0.92 0.90 0.85  CALCIUM 8.5 8.7 8.7   Liver Function Tests:  Recent Labs Lab 12/12/12 1547 12/13/12 1915 12/14/12 0430  AST 535* 168* 185*  ALT 236* 170* 164*  ALKPHOS 1088* 1007* 891*  BILITOT 0.7 0.4 0.3  PROT 6.8 7.8 6.9  ALBUMIN 3.4* 3.2* 2.7*    Recent Labs Lab 12/13/12 1915  LIPASE 244*   No results found for this basename: AMMONIA,  in the last 168 hours CBC:  Recent  Labs Lab 12/12/12 1547 12/13/12 1915 12/14/12 0430  WBC 2.3* 2.7* 3.4*  NEUTROABS 1.4*  --   --   HGB 14.5 14.3 14.0  HCT 42.6 44.9 41.9  MCV 90.4 95.5 94.2  PLT 215 218 198   Cardiac Enzymes: No results found for this basename: CKTOTAL, CKMB, CKMBINDEX, TROPONINI,  in the last 168 hours BNP (last 3 results) No results found for this basename: PROBNP,  in the last 8760 hours CBG: No results found for this basename: GLUCAP,  in the last 168 hours  Recent Results (from the past 240 hour(s))  AFB CULTURE, BLOOD     Status: None    Collection Time    12/12/12  3:48 PM      Result Value Range Status   Preliminary Report Culture will be examined for 6 weeks before    Preliminary   Preliminary Report issuing a Final Report.   Preliminary  AFB CULTURE, BLOOD     Status: None   Collection Time    12/13/12  7:15 PM      Result Value Range Status   Specimen Description BLOOD LEFT ANTECUBITAL   Final   Special Requests AFB ONLY/5CC   Final   Culture     Final   Value: CULTURE WILL BE EXAMINED FOR 6 WEEKS BEFORE ISSUING A FINAL REPORT   Report Status PENDING   Incomplete     Studies: No results found.  Scheduled Meds: . azithromycin  1,200 mg Oral Q Fri  . dolutegravir  50 mg Oral Daily  . emtricitabine-tenofovir  1 tablet Oral Daily  . fluconazole  100 mg Oral Q Thu  . megestrol  800 mg Oral Daily  . nicotine  21 mg Transdermal Daily  . nystatin  5 mL Oral QID  . pantoprazole  20 mg Oral Daily  . sulfamethoxazole-trimethoprim  1 tablet Oral Q M,W,F  . traZODone  50 mg Oral QHS  . valGANciclovir  450 mg Oral BID WC   Continuous Infusions: . sodium chloride 100 mL/hr at 12/13/12 1921      Time spent: 25 minutes    Eddie North  [facility] Pager [phone]. If 7PM-7AM, please contact night-coverage at [email or web address], password [facility] 12/14/2012, 11:07 AM  LOS: 1 day

## 2012-12-14 NOTE — Progress Notes (Signed)
Patient ID: Stephen Griffin, male   DOB: 1980-06-20, 32 y.o.   MRN: [id number]         [facility]    Date of Admission:  12/13/2012     Principal Problem:   Transaminitis Active Problems:   HIV DISEASE   ASTHMA   Alcohol abuse   Pancreatitis   Elevated alkaline phosphatase level   Nicotine dependence   Abdominal pain   Polysubstance abuse   Cigarette smoker   . azithromycin  1,200 mg Oral Q Fri  . dolutegravir  50 mg Oral Daily  . emtricitabine-tenofovir  1 tablet Oral Daily  . fluconazole  100 mg Oral Q Thu  . megestrol  800 mg Oral Daily  . nicotine  21 mg Transdermal Daily  . nystatin  5 mL Oral QID  . pantoprazole  20 mg Oral Daily  . sulfamethoxazole-trimethoprim  1 tablet Oral Q M,W,F  . traZODone  50 mg Oral QHS  . valGANciclovir  450 mg Oral BID WC    Subjective: She says she had one episode of nausea and vomiting this morning after eating breakfast but denies abdominal pain.  Past Medical History  Diagnosis Date  . HIV (human immunodeficiency virus infection)   . DVT (deep venous thrombosis)     "LLE" (12/13/2012)  . Asthma   . Pneumonia     "once" (12/13/2012)  . WUJWJXBJ(478.2)     "weekly" (12/13/2012)  . Esophageal candidiasis     [name] 12/13/2012    History  Substance Use Topics  . Smoking status: Current Every Day Smoker -- 0.50 packs/day for 14 years    Types: Cigarettes  . Smokeless tobacco: Never Used  . Alcohol Use: 28.2 oz/week    47 Cans of beer per week     Comment: 12/13/2012 "2, 40oz beers/day"    History reviewed. No pertinent family history.  No Known Allergies  Objective: Temp:  [97.3 F (36.3 C)-98.2 F (36.8 C)] 97.8 F (36.6 C) (07/18 0526) Pulse Rate:  [73-82] 73 (07/18 0526) Resp:  [16-20] 18 (07/18 0526) BP: (131-142)/(72-99) 137/99 mmHg (07/18 0526) SpO2:  [100 %] 100 % (07/18 0526) Weight:  [56.25 kg (124 lb 0.1 oz)] 56.25 kg (124 lb 0.1 oz) (07/17 1600)  General: She is alert and  comfortable Skin: No oropharyngeal lesions or rash Lungs: Clear Cor: Regular S1 and S2 no murmurs Abdomen: Mild to moderate epigastric tenderness. Quiet bowel sounds Mood and affect: Normal  Lab Results Lab Results  Component Value Date   WBC 3.4* 12/14/2012   HGB 14.0 12/14/2012   HCT 41.9 12/14/2012   MCV 94.2 12/14/2012   PLT 198 12/14/2012    Lab Results  Component Value Date   CREATININE 0.85 12/14/2012   BUN 8 12/14/2012   NA 144 12/14/2012   K 3.6 12/14/2012   CL 110 12/14/2012   CO2 29 12/14/2012    Lab Results  Component Value Date   ALT 164* 12/14/2012   AST 185* 12/14/2012   ALKPHOS 891* 12/14/2012   BILITOT 0.3 12/14/2012      Lab Results  Component Value Date   LIPASE 244* 12/13/2012   Microbiology: Recent Results (from the past 240 hour(s))  AFB CULTURE, BLOOD     Status: None   Collection Time    12/12/12  3:48 PM      Result Value Range Status   Preliminary Report Culture will be examined for 6 weeks before    Preliminary  Preliminary Report issuing a Final Report.   Preliminary  AFB CULTURE, BLOOD     Status: None   Collection Time    12/13/12  7:15 PM      Result Value Range Status   Specimen Description BLOOD LEFT ANTECUBITAL   Final   Special Requests AFB ONLY/5CC   Final   Culture     Final   Value: CULTURE WILL BE EXAMINED FOR 6 WEEKS BEFORE ISSUING A FINAL REPORT   Report Status PENDING   Incomplete   Assessment: Her alkaline phosphatase and lipase are down some but she probably has smoldering pancreatitis. I agree with further evaluation for possible biliary duct obstruction. I doubt that her liver enzyme elevation is related to antiretroviral therapy since it's pretty clear she does not take her medication.   Plan: 1. Continue current antiretroviral and prophylactic antibiotic therapy  2.  please call Dr. Judyann Munson [phone]) for any infectious disease questions this weekend  Cliffton Asters, MD [facility] [phone] pager   [phone] cell 12/14/2012, 11:26 AM

## 2012-12-14 NOTE — Progress Notes (Signed)
INITIAL NUTRITION ASSESSMENT  DOCUMENTATION CODES Per approved criteria  -Underweight   INTERVENTION: 1. Ensure Complete po BID, each supplement provides 350 kcal and 13 grams of protein. (strawberry) once pt diet has been advanced   NUTRITION DIAGNOSIS: Increased nutrient needs related to metabolic demand as evidenced by HIV and hx of weight loss.   Goal: PO intake to meet >/=90% estimated nutrition needs  Monitor:  PO intake, weight trends, labs, I/O's   Reason for Assessment: Health history  32 y.o. male  Admitting Dx: Transaminitis  ASSESSMENT: Pt admitted after OP visit withTransaminitis with abdominal pain: Multifactorial secondary to gallstone pancreatitis, obstructive gallstone, hepatotoxicity effects from tivicay, Truvada, Bactrim HIV/AIDS cholangiopathy. Pt with abdominal pain without nausea and vomiting PTA. Pt also with hx of uncontrolled HIV and Etoh abuse.  RD pulled to chart as pt was seen on last admission for poor oral intake and being underweight. Pt weight has decreased since last admission.  Pt NPO currently for ERCP this afternoon.   Pt states she ate well for breakfast, but then vomited it all. Reports she has been maintaining weight, but would like to gain some weight. Encouraged oral nutrition supplements at home to increased kcal/ protein intake. Pt with increased nutrition needs with uncontrolled HIV.   Height: Ht Readings from Last 1 Encounters:  12/13/12 5\' 11"  (1.803 m)    Weight: Wt Readings from Last 1 Encounters:  12/13/12 124 lb 0.1 oz (56.25 kg)    Ideal Body Weight: 172 lbs   % Ideal Body Weight: 72%  Wt Readings from Last 10 Encounters:  12/13/12 124 lb 0.1 oz (56.25 kg)  12/13/12 124 lb 0.1 oz (56.25 kg)  12/12/12 124 lb (56.246 kg)  11/23/12 124 lb 12.8 oz (56.609 kg)  08/20/12 128 lb 12.8 oz (58.423 kg)  07/26/12 130 lb (58.968 kg)  04/11/12 133 lb 8 oz (60.555 kg)  04/06/11 134 lb (60.782 kg)  06/24/10 128 lb 8 oz (58.287  kg)  05/10/10 124 lb 12 oz (56.586 kg)    Usual Body Weight: 130 lbs   % Usual Body Weight: 95%  BMI:  Body mass index is 17.3 kg/(m^2). Underweight   Estimated Nutritional Needs: Kcal: 1800-2060  Protein: 85-105g  Fluid: >2.0 L/day  Skin: intact   Diet Order: NPO  EDUCATION NEEDS: -No education needs identified at this time   Intake/Output Summary (Last 24 hours) at 12/14/12 1042 Last data filed at 12/14/12 0842  Gross per 24 hour  Intake    360 ml  Output    750 ml  Net   -390 ml    Last BM: PTA    Labs:   Recent Labs Lab 12/12/12 1547 12/13/12 1915 12/14/12 0430  NA 145 144 144  K 3.9 3.1* 3.6  CL 110 108 110  CO2 30 30 29   BUN 12 11 8   CREATININE 0.92 0.90 0.85  CALCIUM 8.5 8.7 8.7  GLUCOSE 80 108* 78    CBG (last 3)  No results found for this basename: GLUCAP,  in the last 72 hours  Scheduled Meds: . azithromycin  1,200 mg Oral Q Fri  . dolutegravir  50 mg Oral Daily  . emtricitabine-tenofovir  1 tablet Oral Daily  . fluconazole  100 mg Oral Q Thu  . megestrol  800 mg Oral Daily  . nicotine  21 mg Transdermal Daily  . nystatin  5 mL Oral QID  . pantoprazole  20 mg Oral Daily  . sulfamethoxazole-trimethoprim  1 tablet Oral  Q M,W,F  . traZODone  50 mg Oral QHS  . valGANciclovir  450 mg Oral BID WC    Continuous Infusions: . sodium chloride 100 mL/hr at 12/13/12 1610    Past Medical History  Diagnosis Date  . HIV (human immunodeficiency virus infection)   . DVT (deep venous thrombosis)     "LLE" (12/13/2012)  . Asthma   . Pneumonia     "once" (12/13/2012)  . RUEAVWUJ(811.9)     "weekly" (12/13/2012)  . Esophageal candidiasis     [name] 12/13/2012    Past Surgical History  Procedure Laterality Date  . 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 Incision and drainage of wound Left 03/2009    index finger  . Incise and drain abcess Left 2008    groin; psoas intraabdominal/notes 09/07/2006 (12/13/2012)    Clarene Duke  RD, LDN Pager [phone] After Hours pager [phone]

## 2012-12-14 NOTE — Consult Note (Signed)
[facility] Consultation  Referring Provider:   [facility]   Primary Care Physician:  Dr. Synthia Innocent Primary Gastroenterologist:  None         Reason for Consultation:    Abdominal pain, elevated LFTs          HPI:   Stephen Griffin is a 32 y.o. male with HIV/AIDS. He was hospitaliized last month with pancreatitis, likely biliary. Patient radmitted yesterday with recurrent abdominal pain and markedly elevated LFTs. His abdominal pain never really resolved after hospital discharge, it just became acutely worse. Pain in upper abdomen with radiation through to back. He has associated nausea / vomiting. Denies other GI problems such as bowel changes / rectal bleeding. He reports drinking approx 2 ETOH beverages a day though this is significantly less than that stated in social history.  Patient doesn't know if there are any GI diseases/ malignancies in his family.    Past Medical History  Diagnosis Date  . HIV (human immunodeficiency virus infection)   . DVT (deep venous thrombosis)     "LLE" (12/13/2012)  . Asthma   . Pneumonia     "once" (12/13/2012)  . ZOXWRUEA(540.9)     "weekly" (12/13/2012)  . Esophageal candidiasis     [name] 12/13/2012    Past Surgical History  Procedure Laterality Date  . 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 Incision and drainage of wound Left 03/2009    index finger  . Incise and drain abcess Left 2008    groin; psoas intraabdominal/notes 09/07/2006 (12/13/2012)    History  Substance Use Topics  . Smoking status: Current Every Day Smoker -- 0.50 packs/day for 14 years    Types: Cigarettes  . Smokeless tobacco: Never Used  . Alcohol Use: 28.2 oz/week    47 Cans of beer per week     Comment: 12/13/2012 "2, 40oz beers/day"    Prior to Admission medications   Medication Sig Start Date End Date Taking? Authorizing Provider  azithromycin (ZITHROMAX) 600 MG tablet Take 2 tablets (1,200 mg total) by mouth once a week. 11/23/12   Yes Genelle Gather, MD  dolutegravir (TIVICAY) 50 MG tablet Take 1 tablet (50 mg total) by mouth daily. 12/12/12  Yes Randall Hiss, MD  emtricitabine-tenofovir (TRUVADA) 200-300 MG per tablet Take 1 tablet by mouth daily. 12/12/12  Yes Randall Hiss, MD  fluconazole (DIFLUCAN) 100 MG tablet Take 1 tablet (100 mg total) by mouth daily. 12/12/12  Yes Randall Hiss, MD  megestrol (MEGACE ES) 625 MG/5ML suspension Take 5 mLs (625 mg total) by mouth daily. 10/26/12  Yes Ginnie Smart, MD  nystatin (MYCOSTATIN) 100000 UNIT/ML suspension Take 5 mLs (500,000 Units total) by mouth 4 (four) times daily. 11/23/12  Yes Genelle Gather, MD  pantoprazole (PROTONIX) 20 MG tablet Take 1 tablet (20 mg total) by mouth daily. 11/23/12  Yes Genelle Gather, MD  sulfamethoxazole-trimethoprim (BACTRIM DS) 800-160 MG per tablet Please take AT LEAST three times a WEEK, if possibly every day to prevent Pneumonia 07/26/12  Yes Randall Hiss, MD  traZODone (DESYREL) 50 MG tablet Take 1 tablet (50 mg total) by mouth at bedtime. 10/26/12  Yes Ginnie Smart, MD  valGANciclovir (VALCYTE) 450 MG tablet Take 1 tablet (450 mg total) by mouth 2 (two) times daily. With a meal 10/26/12  Yes Ginnie Smart, MD    Current Facility-Administered Medications  Medication Dose Route Frequency  Provider Last Rate Last Dose  . 0.9 %  sodium chloride infusion   Intravenous Continuous Ripudeep Jenna Luo, MD 100 mL/hr at 12/13/12 1921    . albuterol (PROVENTIL) (5 MG/ML) 0.5% nebulizer solution 2.5 mg  2.5 mg Nebulization Q2H PRN Ripudeep K Rai, MD      . azithromycin (ZITHROMAX) tablet 1,200 mg  1,200 mg Oral Q Fri Ripudeep K Rai, MD      . dolutegravir (TIVICAY) tablet 50 mg  50 mg Oral Daily Cliffton Asters, MD   50 mg at 12/13/12 2205  . emtricitabine-tenofovir (TRUVADA) 200-300 MG per tablet 1 tablet  1 tablet Oral Daily Cliffton Asters, MD   1 tablet at 12/13/12 2207  . fluconazole (DIFLUCAN) tablet 100 mg  100 mg Oral Q  Letha Cape, MD   100 mg at 12/13/12 2206  . heparin injection 5,000 Units  5,000 Units Subcutaneous Q8H Ripudeep K Rai, MD   5,000 Units at 12/14/12 0700  . HYDROmorphone (DILAUDID) injection 1 mg  1 mg Intravenous Q4H PRN Ripudeep Jenna Luo, MD   1 mg at 12/13/12 2216  . megestrol (MEGACE) 400 MG/10ML suspension 800 mg  800 mg Oral Daily Ripudeep K Rai, MD   800 mg at 12/13/12 2206  . nicotine (NICODERM CQ - dosed in mg/24 hours) patch 21 mg  21 mg Transdermal Daily Ripudeep Jenna Luo, MD   21 mg at 12/13/12 2205  . nystatin (MYCOSTATIN) 100000 UNIT/ML suspension 500,000 Units  5 mL Oral QID Ripudeep Jenna Luo, MD   500,000 Units at 12/13/12 2207  . ondansetron (ZOFRAN) tablet 4 mg  4 mg Oral Q6H PRN Ripudeep Jenna Luo, MD       Or  . ondansetron (ZOFRAN) injection 4 mg  4 mg Intravenous Q6H PRN Ripudeep K Rai, MD      . pantoprazole (PROTONIX) EC tablet 20 mg  20 mg Oral Daily Ripudeep K Rai, MD   20 mg at 12/13/12 2205  . sulfamethoxazole-trimethoprim (BACTRIM DS) 800-160 MG per tablet 1 tablet  1 tablet Oral Q M,W,F Cliffton Asters, MD      . traZODone (DESYREL) tablet 50 mg  50 mg Oral QHS Ripudeep K Rai, MD      . valGANciclovir (VALCYTE) 450 MG tablet TABS 450 mg  450 mg Oral BID WC Ripudeep Jenna Luo, MD   450 mg at 12/14/12 0825    Allergies as of 12/13/2012  . (No Known Allergies)    Review of Systems:    All systems reviewed and negative except where noted in HPI.   Physical Exam:  Vital signs in last 24 hours: Temp:  [97.3 F (36.3 C)-98.2 F (36.8 C)] 97.8 F (36.6 C) (07/18 0526) Pulse Rate:  [73-82] 73 (07/18 0526) Resp:  [16-20] 18 (07/18 0526) BP: (131-142)/(72-99) 137/99 mmHg (07/18 0526) SpO2:  [100 %] 100 % (07/18 0526) Weight:  [124 lb 0.1 oz (56.25 kg)] 124 lb 0.1 oz (56.25 kg) (07/17 1600) Last BM Date: 12/10/12 General:   Pleasant black male in NAD Head:  Normocephalic and atraumatic. Eyes:   No icterus.   Conjunctiva pink. Ears:  Normal auditory acuity. Neck:  Supple;  no masses felt Lungs:  Respirations even and unlabored. Lungs clear to auscultation bilaterally.   No wheezes, crackles, or rhonchi.  Heart:  Regular rate and rhythm; Abdomen:  Soft, nondistended, mild diffuse upper abdominal tenderness. Normal bowel sounds. No appreciable masses or hepatomegaly.  Rectal:  Not performed.  Msk:  Symmetrical without  gross deformities.  Extremities:  Without edema. Neurologic:  Alert and  oriented x4;  grossly normal neurologically. Skin:  Intact without significant lesions or rashes. Cervical Nodes:  No significant cervical adenopathy. Psych:  Alert and cooperative. Normal affect.  LAB RESULTS:  Recent Labs  12/12/12 1547 12/13/12 1915 12/14/12 0430  WBC 2.3* 2.7* 3.4*  HGB 14.5 14.3 14.0  HCT 42.6 44.9 41.9  PLT 215 218 198   BMET  Recent Labs  12/12/12 1547 12/13/12 1915 12/14/12 0430  NA 145 144 144  K 3.9 3.1* 3.6  CL 110 108 110  CO2 30 30 29   GLUCOSE 80 108* 78  BUN 12 11 8   CREATININE 0.92 0.90 0.85  CALCIUM 8.5 8.7 8.7   LFT  Recent Labs  12/14/12 0430  PROT 6.9  ALBUMIN 2.7*  AST 185*  ALT 164*  ALKPHOS 891*  BILITOT 0.3    PREVIOUS ENDOSCOPIES:            flex sign June 2011 Elnoria Howard). Findings: warts, anal fissure and hemorrhoids   Impression / Plan:   1. Pancreatitis, likely biliary given LFTs, gallstones and biliary dilation on ultrasound late last month. Patient needs ERCP for further evaluation and treatment. The risks and benefits of ERCP were explained to the patient and he agrees to proceed. Procedure can be done today around 3pm. Will make him NPO, check coags. He is on TID heparin and got 7am dose. Will d/c for procedure. We will restart SQ Heparin following procedure. He is already on an antibiotic.   2. HIV/AIDS  3. ? ETOH abuse. He reports only 2 ETOH beverages a day to me which is significantly less than that listed in soc history. ETOH may be contributing to pancreatitis   Thanks  Willette Cluster,  NP-C  [facility]  Pager : [phone]  LOS: 1 day   Willette Cluster  12/14/2012, 8:55 AM  Chart was reviewed and patient was examined. X-rays and lab were reviewed.   Patient very likely has  choledocholithiasis which is causing intermittent pancreatitis. Currently he is pain-free which reflects either that he passed a stone or a stone has floated back into his more proximal common bile duct. Plan ERCP today.  The risks, benefits, and possible complications of the procedure, including bleeding, perforation, surgery, and the 5-10% risk for pancreatitis, were explained to the patient.  Patient's questions were answered.   Barbette Hair. Arlyce Dice, M.D., [facility] Cell [phone]

## 2012-12-14 NOTE — Anesthesia Postprocedure Evaluation (Signed)
Anesthesia Post Note  Patient: Stephen Griffin  Procedure(s) Performed: Procedure(s) (LRB): ENDOSCOPIC RETROGRADE CHOLANGIOPANCREATOGRAPHY (ERCP) (N/A)  Anesthesia type: general  Patient location: PACU  Post pain: Pain level controlled  Post assessment: Patient's Cardiovascular Status Stable  Last Vitals:  Filed Vitals:   12/14/12 1700  BP: 154/102  Pulse: 75  Temp:   Resp: 15    Post vital signs: Reviewed and stable  Level of consciousness: sedated  Complications: No apparent anesthesia complications
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
## 2012-12-14 NOTE — OR Nursing (Signed)
Pt in prone position for ERCP with Dr. Arlyce Dice.  Knees padded, pillow under feet, chest rolls in place also providing bilateral axillary support,  Head and neck padded and turned to right, right arm next to side and padded, left arm up in superman position, padded.  Safety strap in place.  Acceptable position verified by Dr. Arlyce Dice. Stephen Forts, RN

## 2012-12-14 NOTE — Op Note (Signed)
[facility] [address] [place], [address]   ERCP PROCEDURE REPORT  PATIENT: Stephen Griffin, Stephen Griffin.  MR# :[id number] BIRTHDATE: May 01, 1981  GENDER: Male ENDOSCOPIST: Louis Meckel, MD REFERRED BY: Cliffton Asters, M.D. PROCEDURE DATE:  12/14/2012 PROCEDURE:   ERCP with sphincterotomy/papillotomy and ERCP with removal of calculus/calculi ASA CLASS:   Class II INDICATIONS:suspected or rule out bile duct stones. MEDICATIONS: MAC sedation, administered by CRNA and Cipro 400 mg IV  TOPICAL ANESTHETIC:  DESCRIPTION OF PROCEDURE:   After the risks benefits and alternatives of the procedure were thoroughly explained, informed consent was obtained.  The Pentax ERCP [id number]  endoscope was introduced through the mouth  and advanced to the third portion of the duodenum .  The common bile duct was selectively cannulated with a 0.35 mm guidewire.  Injection initially demonstrated a slightly irregular duct.  With further injection the duct filled out and there appeared to be several stone fragments in the most proximal portion of the duct and in the intrahepatic portions of the duct.  A 12-15 mm sphincterotomy was made.  The duct was swept multiple times with a 12 mm balloon stone extractor.  Multiple very small stone fragments were delivered into the duodenum.  Final cholangiogram appeared normal. The scope was then completely withdrawn from the patient and the procedure terminated.     COMPLICATIONS:  ENDOSCOPIC IMPRESSION: multiple bile duct stones-status post sphincterotomy and stone extraction  RECOMMENDATIONS: cholecystectomy    _______________________________ eSigned:  Louis Meckel, MD 12/14/2012 4:34 PM   CC:

## 2012-12-14 NOTE — Anesthesia Procedure Notes (Signed)
Procedure Name: Intubation Date/Time: 12/14/2012 3:42 PM Performed by: Lovie Chol Pre-anesthesia Checklist: Patient identified, Emergency Drugs available, Suction available, Patient being monitored and Timeout performed Patient Re-evaluated:Patient Re-evaluated prior to inductionOxygen Delivery Method: Circle system utilized Preoxygenation: Pre-oxygenation with 100% oxygen Intubation Type: IV induction Ventilation: Mask ventilation without difficulty Laryngoscope Size: Miller and 3 Grade View: Grade I Tube type: Oral Tube size: 7.5 mm Number of attempts: 1 Airway Equipment and Method: Stylet and LTA kit utilized Placement Confirmation: ETT inserted through vocal cords under direct vision,  positive ETCO2,  CO2 detector and breath sounds checked- equal and bilateral Secured at: 22 cm Tube secured with: Tape Dental Injury: Teeth and Oropharynx as per pre-operative assessment

## 2012-12-14 NOTE — Anesthesia Preprocedure Evaluation (Addendum)
Anesthesia Evaluation  Patient identified by MRN, date of birth, ID band Patient awake    Reviewed: Allergy & Precautions, H&P , NPO status , Patient's Chart, lab work & pertinent test results  History of Anesthesia Complications Negative for: history of anesthetic complications  Airway Mallampati: I TM Distance: >3 FB Neck ROM: Full    Dental  (+) Teeth Intact and Dental Advisory Given   Pulmonary asthma , pneumonia -, resolved, Current Smoker,          Cardiovascular negative cardio ROS  Rhythm:Regular     Neuro/Psych  Headaches, negative psych ROS   GI/Hepatic negative GI ROS, Neg liver ROS, (+)     substance abuse  alcohol use and marijuana use,   Endo/Other  negative endocrine ROS  Renal/GU negative Renal ROS     Musculoskeletal negative musculoskeletal ROS (+)   Abdominal   Peds  Hematology  (+) HIV,   Anesthesia Other Findings   Reproductive/Obstetrics negative OB ROS                          Anesthesia Physical Anesthesia Plan  ASA: III  Anesthesia Plan: General   Post-op Pain Management:    Induction: Intravenous  Airway Management Planned: Oral ETT  Additional Equipment:   Intra-op Plan:   Post-operative Plan: Extubation in OR  Informed Consent:   Plan Discussed with: CRNA, Anesthesiologist and Surgeon  Anesthesia Plan Comments:         Anesthesia Quick Evaluation

## 2012-12-14 NOTE — Progress Notes (Signed)
Drinking diet ginger ale "states I feel great"

## 2012-12-14 NOTE — Progress Notes (Signed)
Durinf report, pt called stating that he had difficulty breathing r/t the R sided ABD pain.  Pt had ERCP today, pt medicated.  Will check back within the hour to see if pain has subsided.

## 2012-12-14 NOTE — Preoperative (Signed)
Beta Blockers   Reason not to administer Beta Blockers:Not Applicable 

## 2012-12-15 DIAGNOSIS — K851 Biliary acute pancreatitis without necrosis or infection: Secondary | ICD-10-CM | POA: Diagnosis present

## 2012-12-15 DIAGNOSIS — B20 Human immunodeficiency virus [HIV] disease: Secondary | ICD-10-CM | POA: Diagnosis present

## 2012-12-15 DIAGNOSIS — E876 Hypokalemia: Secondary | ICD-10-CM

## 2012-12-15 LAB — COMPREHENSIVE METABOLIC PANEL
Alkaline Phosphatase: 906 U/L — ABNORMAL HIGH (ref 39–117)
BUN: 5 mg/dL — ABNORMAL LOW (ref 6–23)
Calcium: 8.5 mg/dL (ref 8.4–10.5)
Creatinine, Ser: 0.84 mg/dL (ref 0.50–1.35)
GFR calc Af Amer: >90 mL/min (ref >90)
Glucose, Bld: 108 mg/dL — ABNORMAL HIGH (ref 70–99)
Total Protein: 6.4 g/dL (ref 6.0–8.3)

## 2012-12-15 LAB — LIPASE, BLOOD: Lipase: 612 U/L — ABNORMAL HIGH (ref 11–59)

## 2012-12-15 MED ORDER — POTASSIUM CHLORIDE CRYS ER 20 MEQ PO TBCR
40.0000 meq | EXTENDED_RELEASE_TABLET | Freq: Once | ORAL | Status: AC
  Administered 2012-12-15: 40 meq via ORAL
  Filled 2012-12-15: qty 2

## 2012-12-15 NOTE — Progress Notes (Signed)
[facility] Progress Note   Subjective  *Less abdominal pain.  Hungry.**   Objective  Vital signs in last 24 hours: Temp:  [97 F (36.1 C)-98.7 F (37.1 C)] 98.7 F (37.1 C) (07/19 0554) Pulse Rate:  [72-81] 81 (07/19 0554) Resp:  [14-22] 20 (07/19 0554) BP: (136-172)/(92-108) 136/92 mmHg (07/19 0554) SpO2:  [100 %] 100 % (07/19 0554) Last BM Date: 12/10/12 General:   Alert,  Well-developed,  white male in NAD   Intake/Output from previous day: 07/18 0701 - 07/19 0700 In: 1360 [P.O.:360; I.V.:1000] Out: 400 [Urine:400] Intake/Output this shift:    Lab Results:  Recent Labs  12/12/12 1547 12/13/12 1915 12/14/12 0430  WBC 2.3* 2.7* 3.4*  HGB 14.5 14.3 14.0  HCT 42.6 44.9 41.9  PLT 215 218 198   BMET  Recent Labs  12/13/12 1915 12/14/12 0430 12/15/12 0555  NA 144 144 143  K 3.1* 3.6 3.3*  CL 108 110 109  CO2 30 29 27   GLUCOSE 108* 78 108*  BUN 11 8 5*  CREATININE 0.90 0.85 0.84  CALCIUM 8.7 8.7 8.5   LFT  Recent Labs  12/15/12 0555  PROT 6.4  ALBUMIN 2.5*  AST 274*  ALT 223*  ALKPHOS 906*  BILITOT 2.6*   PT/INR  Recent Labs  12/14/12 1100  LABPROT 13.3  INR 1.03   Hepatitis Panel  Recent Labs  12/13/12 1915  HEPBSAG NEGATIVE  HCVAB NEGATIVE  HEPAIGM NEGATIVE  HEPBIGM NEGATIVE    Studies/Results: Dg Ercp Biliary & Pancreatic Ducts  12/14/2012   *RADIOLOGY REPORT*  Clinical Data: Common duct stones, ERCP  ERCP  Comparison: Abdominal ultrasound 11/21/2012  Findings: Five intraprocedural fluoroscopic images are provided, demonstrating no dilatation of the common or proximal intrahepatic ducts.  There is mild ductal irregularity without focal stricture or evidence for extrinsic mass effect.  No filling defect is identified.  Minimal passage of contrast into duodenum is noted. Balloon sweep was performed.  IMPRESSION: No filling defect or ductal dilatation.  However, the wall of the common duct and proximal intrahepatic  ducts is irregular in appearance which raises the question of cholangitis, autoimmune disorder, or much less likely infiltrative neoplasm.  This could also be a side effect of therapy if the patient is currently being treated for the reported history of HIV, or AIDS-related cholangiopathy.   Original Report Authenticated By: Christiana Pellant, M.D.      Assessment & Plan  *Choledocholithiasis-status post sphincterotomy and stone extraction Pancreatitis  LFT abnormalities are slightly worsened today which probably represents edema at the sphincterotomy site.  Agree with plans for cholecystectomy.  Principal Problem:   Acute biliary pancreatitis Active Problems:   HIV DISEASE   ASTHMA   Alcohol abuse   Pancreatitis   Elevated alkaline phosphatase level   Transaminitis   Nicotine dependence   Abdominal pain   Polysubstance abuse   Cigarette smoker   Calculus of bile duct without mention of cholecystitis or obstruction   AIDS     LOS: 2 days   Stephen Griffin  12/15/2012, 1:13 PM

## 2012-12-15 NOTE — Progress Notes (Signed)
Pt becomes very irritated when enter room to give care or medications,  States he takes everything at once, explained that we have to attempt to give medications as ordered by physician, and how the schedules doses.   Refusion Nystatin stated he had already taken that.  No complaints voiced at this time. Renea Schoonmaker [employer]

## 2012-12-15 NOTE — Progress Notes (Signed)
1 Day Post-Op  Subjective: Had ercp with sphincterotomy yesterday - multiple stones. Denies abd pain, n/v. Wants to eat.   Objective: Vital signs in last 24 hours: Temp:  [97 F (36.1 C)-98.7 F (37.1 C)] 98.7 F (37.1 C) (07/19 0554) Pulse Rate:  [72-81] 81 (07/19 0554) Resp:  [14-22] 20 (07/19 0554) BP: (136-172)/(92-108) 136/92 mmHg (07/19 0554) SpO2:  [100 %] 100 % (07/19 0554) Last BM Date: 12/10/12  Intake/Output from previous day: 07/18 0701 - 07/19 0700 In: 1360 [P.O.:360; I.V.:1000] Out: 400 [Urine:400] Intake/Output this shift:    Asleep, easily arousable cta b/l Reg Soft, nd, TTP periumbilical, Rt side; no RT/guarding  Lab Results:   Recent Labs  12/13/12 1915 12/14/12 0430  WBC 2.7* 3.4*  HGB 14.3 14.0  HCT 44.9 41.9  PLT 218 198   BMET  Recent Labs  12/14/12 0430 12/15/12 0555  NA 144 143  K 3.6 3.3*  CL 110 109  CO2 29 27  GLUCOSE 78 108*  BUN 8 5*  CREATININE 0.85 0.84  CALCIUM 8.7 8.5   PT/INR  Recent Labs  12/14/12 1100  LABPROT 13.3  INR 1.03   ABG No results found for this basename: PHART, PCO2, PO2, HCO3,  in the last 72 hours  Studies/Results: Dg Ercp Biliary & Pancreatic Ducts  12/14/2012   *RADIOLOGY REPORT*  Clinical Data: Common duct stones, ERCP  ERCP  Comparison: Abdominal ultrasound 11/21/2012  Findings: Five intraprocedural fluoroscopic images are provided, demonstrating no dilatation of the common or proximal intrahepatic ducts.  There is mild ductal irregularity without focal stricture or evidence for extrinsic mass effect.  No filling defect is identified.  Minimal passage of contrast into duodenum is noted. Balloon sweep was performed.  IMPRESSION: No filling defect or ductal dilatation.  However, the wall of the common duct and proximal intrahepatic ducts is irregular in appearance which raises the question of cholangitis, autoimmune disorder, or much less likely infiltrative neoplasm.  This could also be a side  effect of therapy if the patient is currently being treated for the reported history of HIV, or AIDS-related cholangiopathy.   Original Report Authenticated By: Christiana Pellant, M.D.    Anti-infectives: Anti-infectives   Start     Dose/Rate Route Frequency Ordered Stop   12/14/12 1730  ciprofloxacin (CIPRO) IVPB 400 mg     400 mg 200 mL/hr over 60 Minutes Intravenous  Once 12/14/12 1719 12/14/12 1843   12/14/12 1600  ciprofloxacin (CIPRO) IVPB 400 mg     400 mg 200 mL/hr over 60 Minutes Intravenous  Once 12/14/12 1553 12/14/12 1550   12/14/12 1000  azithromycin (ZITHROMAX) tablet 1,200 mg     1,200 mg Oral Every Fri 12/13/12 1653     12/14/12 1000  sulfamethoxazole-trimethoprim (BACTRIM DS) 800-160 MG per tablet 1 tablet     1 tablet Oral Every M-W-F 12/13/12 1726     12/14/12 0900  sulfamethoxazole-trimethoprim (BACTRIM DS) 800-160 MG per tablet 1 tablet  Status:  Discontinued     1 tablet Oral Once per day on Mon Wed Fri 12/13/12 1653 12/13/12 1709   12/13/12 1800  dolutegravir (TIVICAY) tablet 50 mg  Status:  Discontinued     50 mg Oral Daily 12/13/12 1653 12/13/12 1708   12/13/12 1800  fluconazole (DIFLUCAN) tablet 100 mg  Status:  Discontinued     100 mg Oral Daily 12/13/12 1653 12/13/12 1726   12/13/12 1800  valGANciclovir (VALCYTE) 450 MG tablet TABS 450 mg     450  mg Oral 2 times daily with meals 12/13/12 1653     12/13/12 1800  emtricitabine-tenofovir (TRUVADA) 200-300 MG per tablet 1 tablet     1 tablet Oral Daily 12/13/12 1726     12/13/12 1800  dolutegravir (TIVICAY) tablet 50 mg     50 mg Oral Daily 12/13/12 1726     12/13/12 1800  fluconazole (DIFLUCAN) tablet 100 mg     100 mg Oral Every Thu 12/13/12 1726     12/13/12 1700  emtricitabine-tenofovir (TRUVADA) 200-300 MG per tablet 1 tablet  Status:  Discontinued     1 tablet Oral Daily 12/13/12 1653 12/13/12 1708      Assessment/Plan: s/p Procedure(s): ENDOSCOPIC RETROGRADE CHOLANGIOPANCREATOGRAPHY (ERCP)  (N/A) Pancreatitis secondary to choledocholithiasis LFT and lipase up today - not sure what to make of increased LFTs today, increased Lipase not surprising.   i spent 20 minutes with pt discussing gallbladder surgery, showing video of gallbladder anatomy and gallbladder surgery. Pt is more inclined (but didn't agree) to surgery; explained that surgery would be sun or mon since we need to follow labs results  HOWEVER, when i explained that i think her diet should remain npo, water she became quite upset. i explained that we could try sips of clears -she was adamant about restarting a diet - "so im going to starve".  Attempted to explain rationale for minimal po given tenderness on exam and concern for pancreatitis and postercp pancreatitis. Explained that GI docs may make a difft diet recommendation but right now i would keep water, ice chips.   Will follow Track lfts, lipase, cbc daily Not sure how pt feels now about surgery, i was not able to recover discussion as pt wanted to be left alone.  [name]. Andrey Campanile, MD, FACS General, Bariatric, & Minimally Invasive Surgery [facility], [place]   LOS: 2 days    Stephen Griffin 12/15/2012

## 2012-12-15 NOTE — Progress Notes (Signed)
[facility] PROGRESS NOTE  Stephen Griffin AVW:[id number] DOB: 28-Nov-1980 DOA: 12/13/2012 PCP: No PCP Per Patient  Brief narrative:  32 year old transgender with past medical history of poorly controlled HIV/AIDS, esophageal candidiasis, alcohol abuse, nicotine abuse, noncompliance, discharged from IM teaching service 3 weeks back after admitted for etoh pancreatitis, Patient followed up with Dr. Algis Liming on 7/16 and was complaining of abdominal pain. Patient had repeat LFTs done in the office and were noticed to be extremely high and sent to the hospital for further w/up.    Assessment/Plan:  Transaminitis with abdominal pain:  Secondary to biliary pancreatitis. ERCP on 7/18 showed multiple CBD stone s/p extraction and sphincterotomy.  - abdominal ultrasound done during the previous admission which had shown cholelithiasis without cholecystitis, new mild biliary dilatation and ill-defined enlargement of pancreatic head likely pancreatitis.  - Continue IV fluids, pain control and antiemetics.  Appreciate GI eval. Surgery consulted for cholecystectomy. Patient declined initially. i discussed with him further about the risks and benefit of surgery. He now agrees for it. i will talk to surgery again. Likely can be put on schedule for tomorrow.  increased lipase and transaminiases in am labs. Could be post ERCP. abdominal pain seems stable. will monitor closely. -place on clears. Follow lipase and liver enzymes closely. NPO after midnight if he happens to have surgery tomorrow.   Active Problems:  HIV AIDS  CD4 of 10 only. Patient non compliant with meds. Appreciate ID recs. Acute hepatitis panel sent  - ID consulted. Resumed ART. Bactrim and azithro for prophylaxis.  AFB blood cx sent   Alcohol abuse  -counseled on etoh cessation. No signs of withdrawal   Tobacco abuse  counseled on cessations. On nicotine patch   DVT prophylaxis: Heparin subcutaneous   CODE STATUS: Full code     Family Communication: none at bedside  Disposition Plan: home once stable   Consultants:  ID  [facility]  Procedures:  For ERCP on 7/18 Possibly lap choly this admission   Antibiotics:  Bactrim and azithromycin for prophylaxis  HPI/Subjective:  Still has some epigastric pain. No N/V. Agrees for surgery.    Objective: Filed Vitals:   12/14/12 1715 12/14/12 1938 12/14/12 2200 12/15/12 0554  BP: 148/104 152/100 172/104 136/92  Pulse: 78 78 74 81  Temp: 97 F (36.1 C) 97.7 F (36.5 C) 97.8 F (36.6 C) 98.7 F (37.1 C)  TempSrc:  Oral Oral Oral  Resp: 14 22 20 20   Height:      Weight:      SpO2: 100% 100% 100% 100%    Intake/Output Summary (Last 24 hours) at 12/15/12 1041 Last data filed at 12/14/12 1630  Gross per 24 hour  Intake   1000 ml  Output      0 ml  Net   1000 ml   Filed Weights   12/13/12 1600  Weight: 56.25 kg (124 lb 0.1 oz)    Exam:  General: Middle aged male in NAD  HEENT: no pallor, moist mucosa, no oral thrush  Chest: clear b/l, no added sounds  CVS: NS1&S2, no murmurs, rubs or gallop  ABD: soft,mild epigastric tenderness  EXT: warm, no edema  CNS: AAOX3   Data Reviewed: Basic Metabolic Panel:  Recent Labs Lab 12/12/12 1547 12/13/12 1915 12/14/12 0430 12/15/12 0555  NA 145 144 144 143  K 3.9 3.1* 3.6 3.3*  CL 110 108 110 109  CO2 30 30 29 27   GLUCOSE 80 108* 78 108*  BUN 12 11  8 5*  CREATININE 0.92 0.90 0.85 0.84  CALCIUM 8.5 8.7 8.7 8.5   Liver Function Tests:  Recent Labs Lab 12/12/12 1547 12/13/12 1915 12/14/12 0430 12/15/12 0555  AST 535* 168* 185* 274*  ALT 236* 170* 164* 223*  ALKPHOS 1088* 1007* 891* 906*  BILITOT 0.7 0.4 0.3 2.6*  PROT 6.8 7.8 6.9 6.4  ALBUMIN 3.4* 3.2* 2.7* 2.5*    Recent Labs Lab 12/13/12 1915 12/15/12 0555  LIPASE 244* 612*   No results found for this basename: AMMONIA,  in the last 168 hours CBC:  Recent Labs Lab 12/12/12 1547 12/13/12 1915 12/14/12 0430  WBC  2.3* 2.7* 3.4*  NEUTROABS 1.4*  --   --   HGB 14.5 14.3 14.0  HCT 42.6 44.9 41.9  MCV 90.4 95.5 94.2  PLT 215 218 198   Cardiac Enzymes: No results found for this basename: CKTOTAL, CKMB, CKMBINDEX, TROPONINI,  in the last 168 hours BNP (last 3 results) No results found for this basename: PROBNP,  in the last 8760 hours CBG: No results found for this basename: GLUCAP,  in the last 168 hours  Recent Results (from the past 240 hour(s))  AFB CULTURE, BLOOD     Status: None   Collection Time    12/12/12  3:48 PM      Result Value Range Status   Preliminary Report Culture will be examined for 6 weeks before    Preliminary   Preliminary Report issuing a Final Report.   Preliminary  AFB CULTURE, BLOOD     Status: None   Collection Time    12/13/12  7:15 PM      Result Value Range Status   Specimen Description BLOOD LEFT ANTECUBITAL   Final   Special Requests AFB ONLY/5CC   Final   Culture     Final   Value: CULTURE WILL BE EXAMINED FOR 6 WEEKS BEFORE ISSUING A FINAL REPORT   Report Status PENDING   Incomplete  URINE CULTURE     Status: None   Collection Time    12/13/12  9:57 PM      Result Value Range Status   Specimen Description URINE, CATHETERIZED   Final   Special Requests NONE   Final   Culture  Setup Time 12/13/2012 22:22   Final   Colony Count NO GROWTH   Final   Culture NO GROWTH   Final   Report Status 12/14/2012 FINAL   Final     Studies: Dg Ercp Biliary & Pancreatic Ducts  12/14/2012   *RADIOLOGY REPORT*  Clinical Data: Common duct stones, ERCP  ERCP  Comparison: Abdominal ultrasound 11/21/2012  Findings: Five intraprocedural fluoroscopic images are provided, demonstrating no dilatation of the common or proximal intrahepatic ducts.  There is mild ductal irregularity without focal stricture or evidence for extrinsic mass effect.  No filling defect is identified.  Minimal passage of contrast into duodenum is noted. Balloon sweep was performed.  IMPRESSION: No filling  defect or ductal dilatation.  However, the wall of the common duct and proximal intrahepatic ducts is irregular in appearance which raises the question of cholangitis, autoimmune disorder, or much less likely infiltrative neoplasm.  This could also be a side effect of therapy if the patient is currently being treated for the reported history of HIV, or AIDS-related cholangiopathy.   Original Report Authenticated By: Christiana Pellant, M.D.    Scheduled Meds: . azithromycin  1,200 mg Oral Q Fri  . dolutegravir  50 mg Oral Daily  .  emtricitabine-tenofovir  1 tablet Oral Daily  . fluconazole  100 mg Oral Q Thu  . megestrol  800 mg Oral Daily  . nicotine  21 mg Transdermal Daily  . nystatin  5 mL Oral QID  . pantoprazole  20 mg Oral Daily  . sulfamethoxazole-trimethoprim  1 tablet Oral Q M,W,F  . traZODone  50 mg Oral QHS  . valGANciclovir  450 mg Oral BID WC   Continuous Infusions: . sodium chloride 100 mL/hr at 12/13/12 1921  . lactated ringers 50 mL/hr at 12/14/12 1419      Time spent: 25 MINUTES    Nikaela Coyne, [facility]  [facility] Pager [phone]. If 7PM-7AM, please contact night-coverage at [email or web address], password [facility] 12/15/2012, 10:41 AM  LOS: 2 days

## 2012-12-16 LAB — COMPREHENSIVE METABOLIC PANEL
ALT: 168 U/L — ABNORMAL HIGH (ref 0–53)
AST: 135 U/L — ABNORMAL HIGH (ref 0–37)
Albumin: 2.5 g/dL — ABNORMAL LOW (ref 3.5–5.2)
Calcium: 8.9 mg/dL (ref 8.4–10.5)
Sodium: 143 mEq/L (ref 135–145)
Total Protein: 6.4 g/dL (ref 6.0–8.3)

## 2012-12-16 MED ORDER — ENOXAPARIN SODIUM 40 MG/0.4ML ~~LOC~~ SOLN
40.0000 mg | SUBCUTANEOUS | Status: DC
  Administered 2012-12-16 – 2012-12-18 (×2): 40 mg via SUBCUTANEOUS
  Filled 2012-12-16 (×4): qty 0.4

## 2012-12-16 NOTE — Progress Notes (Signed)
[facility] PROGRESS NOTE  DENZIL MCEACHRON ZOX:[id number] DOB: 09-22-1980 DOA: 12/13/2012 PCP: No PCP Per Patient  Brief narrative:  32 year old transgender with past medical history of poorly controlled HIV/AIDS, esophageal candidiasis, alcohol abuse, nicotine abuse, noncompliance, discharged from IM teaching service 3 weeks back after admitted for etoh pancreatitis, Patient followed up with Dr. Algis Liming on 7/16 and was complaining of abdominal pain. Patient had repeat LFTs done in the office and were noticed to be extremely high and sent to the hospital for further w/up.   Assessment/Plan:  Transaminitis with abdominal pain:  Secondary to biliary pancreatitis. ERCP on 7/18 showed multiple CBD stone s/p extraction and sphincterotomy.  - abdominal ultrasound done during the previous admission which had shown cholelithiasis without cholecystitis, new mild biliary dilatation and ill-defined enlargement of pancreatic head likely pancreatitis.  - Continue IV fluids, pain control and antiemetics.  Appreciate GI eval. Surgery consulted for cholecystectomy. Patient declined initially. i discussed with him further about the risks and benefit of surgery. He now agrees for it. increased lipase and transaminases post ERCP. abdominal pain seemsbetter  - on clears. Marland Kitchen LFTs slowly trending down. Lipase much improved. -sx want to wait until tomorrow to decide on lap choly    Active Problems:  HIV AIDS  CD4 of 10 only. Patient non compliant with meds. Appreciate ID recs. Acute hepatitis panel sent  - ID consulted. Resumed ART. Bactrim and azithro for prophylaxis.  AFB blood cx sent   Alcohol abuse  -counseled on etoh cessation. No signs of withdrawal   Tobacco abuse  counseled on cessations. On nicotine patch   DVT prophylaxis: Heparin subcutaneous   CODE STATUS: Full code  Family Communication: none at bedside  Disposition Plan: home once stable    Consultants:  ID  [facility]  [facility]    Procedures:  For ERCP on 7/18  Possibly lap choly this admission   Antibiotics:  Bactrim and azithromycin for prophylaxis   HPI/Subjective:   epigastric pain slightly better . No N/V.    Objective: Filed Vitals:   12/15/12 0554 12/15/12 1352 12/15/12 2200 12/16/12 1350  BP: 136/92 141/89 136/87 141/94  Pulse: 81 76 77 80  Temp: 98.7 F (37.1 C) 98.3 F (36.8 C) 98.7 F (37.1 C) 98.9 F (37.2 C)  TempSrc: Oral Oral  Oral  Resp: 20 18 18 18   Height:      Weight:      SpO2: 100% 100% 100% 100%    Intake/Output Summary (Last 24 hours) at 12/16/12 1414 Last data filed at 12/16/12 1300  Gross per 24 hour  Intake   1800 ml  Output   2875 ml  Net  -1075 ml   Filed Weights   12/13/12 1600  Weight: 56.25 kg (124 lb 0.1 oz)    Exam:  General: Middle aged male in NAD  HEENT: no pallor, moist mucosa, no oral thrush  Chest: clear b/l, no added sounds  CVS: NS1&S2, no murmurs, rubs or gallop  ABD: soft,mild epigastric tenderness  EXT: warm, no edema  CNS: AAOX3   Data Reviewed: Basic Metabolic Panel:  Recent Labs Lab 12/12/12 1547 12/13/12 1915 12/14/12 0430 12/15/12 0555 12/16/12 0518  NA 145 144 144 143 143  K 3.9 3.1* 3.6 3.3* 3.6  CL 110 108 110 109 110  CO2 30 30 29 27 25   GLUCOSE 80 108* 78 108* 84  BUN 12 11 8  5* 6  CREATININE 0.92 0.90 0.85 0.84 0.93  CALCIUM 8.5 8.7 8.7 8.5  8.9   Liver Function Tests:  Recent Labs Lab 12/12/12 1547 12/13/12 1915 12/14/12 0430 12/15/12 0555 12/16/12 0518  AST 535* 168* 185* 274* 135*  ALT 236* 170* 164* 223* 168*  ALKPHOS 1088* 1007* 891* 906* 884*  BILITOT 0.7 0.4 0.3 2.6* 4.5*  PROT 6.8 7.8 6.9 6.4 6.4  ALBUMIN 3.4* 3.2* 2.7* 2.5* 2.5*    Recent Labs Lab 12/13/12 1915 12/15/12 0555 12/16/12 0518  LIPASE 244* 612* 128*   No results found for this basename: AMMONIA,  in the last 168 hours CBC:  Recent Labs Lab 12/12/12 1547 12/13/12 1915 12/14/12 0430  WBC 2.3* 2.7* 3.4*  NEUTROABS  1.4*  --   --   HGB 14.5 14.3 14.0  HCT 42.6 44.9 41.9  MCV 90.4 95.5 94.2  PLT 215 218 198   Cardiac Enzymes: No results found for this basename: CKTOTAL, CKMB, CKMBINDEX, TROPONINI,  in the last 168 hours BNP (last 3 results) No results found for this basename: PROBNP,  in the last 8760 hours CBG: No results found for this basename: GLUCAP,  in the last 168 hours  Recent Results (from the past 240 hour(s))  AFB CULTURE, BLOOD     Status: None   Collection Time    12/12/12  3:48 PM      Result Value Range Status   Preliminary Report Culture will be examined for 6 weeks before    Preliminary   Preliminary Report issuing a Final Report.   Preliminary  AFB CULTURE, BLOOD     Status: None   Collection Time    12/13/12  7:15 PM      Result Value Range Status   Specimen Description BLOOD LEFT ANTECUBITAL   Final   Special Requests AFB ONLY/5CC   Final   Culture     Final   Value: CULTURE WILL BE EXAMINED FOR 6 WEEKS BEFORE ISSUING A FINAL REPORT   Report Status PENDING   Incomplete  URINE CULTURE     Status: None   Collection Time    12/13/12  9:57 PM      Result Value Range Status   Specimen Description URINE, CATHETERIZED   Final   Special Requests NONE   Final   Culture  Setup Time 12/13/2012 22:22   Final   Colony Count NO GROWTH   Final   Culture NO GROWTH   Final   Report Status 12/14/2012 FINAL   Final     Studies: Dg Ercp Biliary & Pancreatic Ducts  12/14/2012   *RADIOLOGY REPORT*  Clinical Data: Common duct stones, ERCP  ERCP  Comparison: Abdominal ultrasound 11/21/2012  Findings: Five intraprocedural fluoroscopic images are provided, demonstrating no dilatation of the common or proximal intrahepatic ducts.  There is mild ductal irregularity without focal stricture or evidence for extrinsic mass effect.  No filling defect is identified.  Minimal passage of contrast into duodenum is noted. Balloon sweep was performed.  IMPRESSION: No filling defect or ductal dilatation.   However, the wall of the common duct and proximal intrahepatic ducts is irregular in appearance which raises the question of cholangitis, autoimmune disorder, or much less likely infiltrative neoplasm.  This could also be a side effect of therapy if the patient is currently being treated for the reported history of HIV, or AIDS-related cholangiopathy.   Original Report Authenticated By: Christiana Pellant, M.D.    Scheduled Meds: . azithromycin  1,200 mg Oral Q Fri  . dolutegravir  50 mg Oral Daily  . emtricitabine-tenofovir  1 tablet Oral Daily  . fluconazole  100 mg Oral Q Thu  . megestrol  800 mg Oral Daily  . nicotine  21 mg Transdermal Daily  . nystatin  5 mL Oral QID  . pantoprazole  20 mg Oral Daily  . sulfamethoxazole-trimethoprim  1 tablet Oral Q M,W,F  . traZODone  50 mg Oral QHS  . valGANciclovir  450 mg Oral BID WC   Continuous Infusions: . sodium chloride 100 mL/hr at 12/13/12 1921  . lactated ringers 50 mL/hr at 12/16/12 1231      Time spent: 25 minutes    Eddie North  [facility] Pager [phone]. If 7PM-7AM, please contact night-coverage at [email or web address], password [facility] 12/16/2012, 2:14 PM  LOS: 3 days

## 2012-12-16 NOTE — Progress Notes (Signed)
[facility] Progress Note   Subjective  **hungry.  Denies abdominal pain*   Objective  Vital signs in last 24 hours: Temp:  [98.3 F (36.8 C)-98.7 F (37.1 C)] 98.7 F (37.1 C) (07/19 2200) Pulse Rate:  [76-77] 77 (07/19 2200) Resp:  [18] 18 (07/19 2200) BP: (136-141)/(87-89) 136/87 mmHg (07/19 2200) SpO2:  [100 %] 100 % (07/19 2200) Last BM Date: 12/10/12 General:   Alert,  Well-developed,  white male in NAD Heart:  Regular rate and rhythm; no murmurs Abdomen:  Soft, nontender and nondistended. Normal bowel sounds, without guarding, and without rebound.   Extremities:  Without edema. Neurologic:  Alert and  oriented x4;  grossly normal neurologically. Psych:  Alert and cooperative. Normal mood and affect.  Intake/Output from previous day: 07/19 0701 - 07/20 0700 In: 1080 [P.O.:480; I.V.:600] Out: 2575 [Urine:2575] Intake/Output this shift: Total I/O In: 0  Out: 300 [Urine:300]  Lab Results:  Recent Labs  12/13/12 1915 12/14/12 0430  WBC 2.7* 3.4*  HGB 14.3 14.0  HCT 44.9 41.9  PLT 218 198   BMET  Recent Labs  12/14/12 0430 12/15/12 0555 12/16/12 0518  NA 144 143 143  K 3.6 3.3* 3.6  CL 110 109 110  CO2 29 27 25   GLUCOSE 78 108* 84  BUN 8 5* 6  CREATININE 0.85 0.84 0.93  CALCIUM 8.7 8.5 8.9   LFT  Recent Labs  12/16/12 0518  PROT 6.4  ALBUMIN 2.5*  AST 135*  ALT 168*  ALKPHOS 884*  BILITOT 4.5*   PT/INR  Recent Labs  12/14/12 1100  LABPROT 13.3  INR 1.03   Hepatitis Panel  Recent Labs  12/13/12 1915  HEPBSAG NEGATIVE  HCVAB NEGATIVE  HEPAIGM NEGATIVE  HEPBIGM NEGATIVE    Studies/Results: Dg Ercp Biliary & Pancreatic Ducts  12/14/2012   *RADIOLOGY REPORT*  Clinical Data: Common duct stones, ERCP  ERCP  Comparison: Abdominal ultrasound 11/21/2012  Findings: Five intraprocedural fluoroscopic images are provided, demonstrating no dilatation of the common or proximal intrahepatic ducts.  There is mild ductal  irregularity without focal stricture or evidence for extrinsic mass effect.  No filling defect is identified.  Minimal passage of contrast into duodenum is noted. Balloon sweep was performed.  IMPRESSION: No filling defect or ductal dilatation.  However, the wall of the common duct and proximal intrahepatic ducts is irregular in appearance which raises the question of cholangitis, autoimmune disorder, or much less likely infiltrative neoplasm.  This could also be a side effect of therapy if the patient is currently being treated for the reported history of HIV, or AIDS-related cholangiopathy.   Original Report Authenticated By: Christiana Pellant, M.D.      Assessment & Plan  *1.  S/p sphincterotomy and extract ion of small CBD stones 2.   Persistent cholestasis.  This could be due to edema at ampulla.  Intrahepatic cholestasis should also be considered.  Recommend proceeding with lap chole.  If IOC is negative and there is good flow into duodenum would obtain liver biopsy.**  Principal Problem:   Acute biliary pancreatitis Active Problems:   HIV DISEASE   ASTHMA   Alcohol abuse   Pancreatitis   Elevated alkaline phosphatase level   Transaminitis   Nicotine dependence   Abdominal pain   Polysubstance abuse   Cigarette smoker   Calculus of bile duct without mention of cholecystitis or obstruction   AIDS     LOS: 3 days   Melvia Heaps  12/16/2012, 11:10 AM

## 2012-12-16 NOTE — Progress Notes (Signed)
2 Days Post-Op  Subjective: Left sided abdominal pain improving  Objective: Vital signs in last 24 hours: Temp:  [98.3 F (36.8 C)-98.7 F (37.1 C)] 98.7 F (37.1 C) (07/19 2200) Pulse Rate:  [76-77] 77 (07/19 2200) Resp:  [18] 18 (07/19 2200) BP: (136-141)/(87-89) 136/87 mmHg (07/19 2200) SpO2:  [100 %] 100 % (07/19 2200) Last BM Date: 12/10/12  Intake/Output from previous day: 07/19 0701 - 07/20 0700 In: 1080 [P.O.:480; I.V.:600] Out: 2575 [Urine:2575] Intake/Output this shift: Total I/O In: 0  Out: 300 [Urine:300]  General appearance: alert and cooperative Resp: clear to auscultation bilaterally GI: soft, no RUQ tenderness, mild LUQ tenderness Neuro: mild agitation  Lab Results:   Recent Labs  12/13/12 1915 12/14/12 0430  WBC 2.7* 3.4*  HGB 14.3 14.0  HCT 44.9 41.9  PLT 218 198   BMET  Recent Labs  12/15/12 0555 12/16/12 0518  NA 143 143  K 3.3* 3.6  CL 109 110  CO2 27 25  GLUCOSE 108* 84  BUN 5* 6  CREATININE 0.84 0.93  CALCIUM 8.5 8.9   PT/INR  Recent Labs  12/14/12 1100  LABPROT 13.3  INR 1.03   ABG No results found for this basename: PHART, PCO2, PO2, HCO3,  in the last 72 hours  Studies/Results: Dg Ercp Biliary & Pancreatic Ducts  12/14/2012   *RADIOLOGY REPORT*  Clinical Data: Common duct stones, ERCP  ERCP  Comparison: Abdominal ultrasound 11/21/2012  Findings: Five intraprocedural fluoroscopic images are provided, demonstrating no dilatation of the common or proximal intrahepatic ducts.  There is mild ductal irregularity without focal stricture or evidence for extrinsic mass effect.  No filling defect is identified.  Minimal passage of contrast into duodenum is noted. Balloon sweep was performed.  IMPRESSION: No filling defect or ductal dilatation.  However, the wall of the common duct and proximal intrahepatic ducts is irregular in appearance which raises the question of cholangitis, autoimmune disorder, or much less likely infiltrative  neoplasm.  This could also be a side effect of therapy if the patient is currently being treated for the reported history of HIV, or AIDS-related cholangiopathy.   Original Report Authenticated By: Christiana Pellant, M.D.    Anti-infectives: Anti-infectives   Start     Dose/Rate Route Frequency Ordered Stop   12/14/12 1730  ciprofloxacin (CIPRO) IVPB 400 mg     400 mg 200 mL/hr over 60 Minutes Intravenous  Once 12/14/12 1719 12/14/12 1843   12/14/12 1600  ciprofloxacin (CIPRO) IVPB 400 mg     400 mg 200 mL/hr over 60 Minutes Intravenous  Once 12/14/12 1553 12/14/12 1550   12/14/12 1000  azithromycin (ZITHROMAX) tablet 1,200 mg     1,200 mg Oral Every Fri 12/13/12 1653     12/14/12 1000  sulfamethoxazole-trimethoprim (BACTRIM DS) 800-160 MG per tablet 1 tablet     1 tablet Oral Every M-W-F 12/13/12 1726     12/14/12 0900  sulfamethoxazole-trimethoprim (BACTRIM DS) 800-160 MG per tablet 1 tablet  Status:  Discontinued     1 tablet Oral Once per day on Mon Wed Fri 12/13/12 1653 12/13/12 1709   12/13/12 1800  dolutegravir (TIVICAY) tablet 50 mg  Status:  Discontinued     50 mg Oral Daily 12/13/12 1653 12/13/12 1708   12/13/12 1800  fluconazole (DIFLUCAN) tablet 100 mg  Status:  Discontinued     100 mg Oral Daily 12/13/12 1653 12/13/12 1726   12/13/12 1800  valGANciclovir (VALCYTE) 450 MG tablet TABS 450 mg  450 mg Oral 2 times daily with meals 12/13/12 1653     12/13/12 1800  emtricitabine-tenofovir (TRUVADA) 200-300 MG per tablet 1 tablet     1 tablet Oral Daily 12/13/12 1726     12/13/12 1800  dolutegravir (TIVICAY) tablet 50 mg     50 mg Oral Daily 12/13/12 1726     12/13/12 1800  fluconazole (DIFLUCAN) tablet 100 mg     100 mg Oral Every Thu 12/13/12 1726     12/13/12 1700  emtricitabine-tenofovir (TRUVADA) 200-300 MG per tablet 1 tablet  Status:  Discontinued     1 tablet Oral Daily 12/13/12 1653 12/13/12 1708      Assessment/Plan: s/p Procedure(s): ENDOSCOPIC RETROGRADE  CHOLANGIOPANCREATOGRAPHY (ERCP) (N/A) Recurrent pancreatitis after ERCP - once this resolves will proceed with lap chole.  Check lipase in AM and will see how exam is. Plan D/W patient.  LOS: 3 days    Zaven Klemens E 12/16/2012

## 2012-12-17 ENCOUNTER — Encounter (HOSPITAL_COMMUNITY): Payer: Self-pay | Admitting: Anesthesiology

## 2012-12-17 ENCOUNTER — Encounter (HOSPITAL_COMMUNITY)
Admission: AD | Disposition: A | Discharge status: Home / Self Care | Payer: Self-pay | Source: Ambulatory Visit | Attending: Internal Medicine

## 2012-12-17 ENCOUNTER — Inpatient Hospital Stay (HOSPITAL_COMMUNITY): Payer: Medicaid Other | Admitting: Anesthesiology

## 2012-12-17 ENCOUNTER — Encounter (HOSPITAL_COMMUNITY): Payer: Self-pay | Admitting: Gastroenterology

## 2012-12-17 ENCOUNTER — Inpatient Hospital Stay (HOSPITAL_COMMUNITY): Payer: Medicaid Other

## 2012-12-17 DIAGNOSIS — K8010 Calculus of gallbladder with chronic cholecystitis without obstruction: Secondary | ICD-10-CM

## 2012-12-17 DIAGNOSIS — K8309 Other cholangitis: Secondary | ICD-10-CM | POA: Diagnosis present

## 2012-12-17 HISTORY — PX: CHOLECYSTECTOMY: SHX55

## 2012-12-17 HISTORY — DX: Other cholangitis: K83.09

## 2012-12-17 LAB — COMPREHENSIVE METABOLIC PANEL
ALT: 156 U/L — ABNORMAL HIGH (ref 0–53)
Albumin: 2.4 g/dL — ABNORMAL LOW (ref 3.5–5.2)
Alkaline Phosphatase: 808 U/L — ABNORMAL HIGH (ref 39–117)
BUN: 10 mg/dL (ref 6–23)
Chloride: 109 mEq/L (ref 96–112)
GFR calc Af Amer: >90 mL/min (ref >90)
Glucose, Bld: 87 mg/dL (ref 70–99)
Potassium: 3.6 mEq/L (ref 3.5–5.1)
Total Bilirubin: 4.6 mg/dL — ABNORMAL HIGH (ref 0.3–1.2)

## 2012-12-17 LAB — LIPASE, BLOOD: Lipase: 133 U/L — ABNORMAL HIGH (ref 11–59)

## 2012-12-17 SURGERY — LAPAROSCOPIC CHOLECYSTECTOMY WITH INTRAOPERATIVE CHOLANGIOGRAM
Anesthesia: General | Site: Abdomen | Wound class: Contaminated

## 2012-12-17 MED ORDER — DIPHENHYDRAMINE HCL 12.5 MG/5ML PO ELIX
12.5000 mg | ORAL_SOLUTION | Freq: Three times a day (TID) | ORAL | Status: DC | PRN
  Administered 2012-12-17: 12.5 mg via ORAL
  Filled 2012-12-17: qty 10

## 2012-12-17 MED ORDER — CEFAZOLIN SODIUM 1-5 GM-% IV SOLN
INTRAVENOUS | Status: AC
  Filled 2012-12-17: qty 100

## 2012-12-17 MED ORDER — BUPIVACAINE-EPINEPHRINE PF 0.25-1:200000 % IJ SOLN
INTRAMUSCULAR | Status: AC
  Filled 2012-12-17: qty 30

## 2012-12-17 MED ORDER — LACTATED RINGERS IV SOLN
INTRAVENOUS | Status: DC
  Administered 2012-12-17: 13:00:00 via INTRAVENOUS

## 2012-12-17 MED ORDER — PROMETHAZINE HCL 25 MG/ML IJ SOLN
INTRAMUSCULAR | Status: AC
  Administered 2012-12-17: 6.25 mg via INTRAVENOUS
  Filled 2012-12-17: qty 1

## 2012-12-17 MED ORDER — PROPOFOL 10 MG/ML IV BOLUS
INTRAVENOUS | Status: DC | PRN
  Administered 2012-12-17: 200 mg via INTRAVENOUS

## 2012-12-17 MED ORDER — ROCURONIUM BROMIDE 100 MG/10ML IV SOLN
INTRAVENOUS | Status: DC | PRN
  Administered 2012-12-17: 40 mg via INTRAVENOUS

## 2012-12-17 MED ORDER — HYDROMORPHONE HCL PF 1 MG/ML IJ SOLN
INTRAMUSCULAR | Status: AC
  Administered 2012-12-17: 0.5 mg via INTRAVENOUS
  Filled 2012-12-17: qty 1

## 2012-12-17 MED ORDER — HYDROMORPHONE HCL PF 1 MG/ML IJ SOLN: 0.2500 mg | INTRAMUSCULAR | Status: DC | PRN

## 2012-12-17 MED ORDER — LIDOCAINE HCL (CARDIAC) 20 MG/ML IV SOLN
INTRAVENOUS | Status: DC | PRN
  Administered 2012-12-17: 100 mg via INTRAVENOUS

## 2012-12-17 MED ORDER — ONDANSETRON HCL 4 MG/2ML IJ SOLN: 4.0000 mg | Freq: Four times a day (QID) | INTRAMUSCULAR | Status: DC | PRN

## 2012-12-17 MED ORDER — HYDRALAZINE HCL 20 MG/ML IJ SOLN: 20.0000 mg | INTRAMUSCULAR | Status: DC | PRN

## 2012-12-17 MED ORDER — HYDROMORPHONE HCL PF 1 MG/ML IJ SOLN: 1.0000 mg | INTRAMUSCULAR | Status: DC | PRN

## 2012-12-17 MED ORDER — OXYCODONE HCL 5 MG PO TABS: 5.0000 mg | ORAL_TABLET | Freq: Once | ORAL | Status: AC | PRN

## 2012-12-17 MED ORDER — LACTATED RINGERS IV SOLN
INTRAVENOUS | Status: DC | PRN
  Administered 2012-12-17: 13:00:00 via INTRAVENOUS

## 2012-12-17 MED ORDER — OXYCODONE HCL 5 MG/5ML PO SOLN: 5.0000 mg | Freq: Once | ORAL | Status: AC | PRN

## 2012-12-17 MED ORDER — ARTIFICIAL TEARS OP OINT
TOPICAL_OINTMENT | OPHTHALMIC | Status: DC | PRN
  Administered 2012-12-17: 1 via OPHTHALMIC

## 2012-12-17 MED ORDER — MIDAZOLAM HCL 5 MG/5ML IJ SOLN
INTRAMUSCULAR | Status: DC | PRN
  Administered 2012-12-17: 2 mg via INTRAVENOUS

## 2012-12-17 MED ORDER — OXYCODONE HCL 5 MG PO TABS
10.0000 mg | ORAL_TABLET | ORAL | Status: DC | PRN
  Administered 2012-12-17 – 2012-12-18 (×4): 10 mg via ORAL
  Filled 2012-12-17 (×5): qty 2

## 2012-12-17 MED ORDER — ONDANSETRON HCL 4 MG/2ML IJ SOLN
INTRAMUSCULAR | Status: DC | PRN
  Administered 2012-12-17: 4 mg via INTRAVENOUS

## 2012-12-17 MED ORDER — SODIUM CHLORIDE 0.9 % IV SOLN
INTRAVENOUS | Status: DC | PRN
  Administered 2012-12-17: 13:00:00

## 2012-12-17 MED ORDER — SODIUM CHLORIDE 0.9 % IR SOLN
Status: DC | PRN
  Administered 2012-12-17: 1000 mL

## 2012-12-17 MED ORDER — GLYCOPYRROLATE 0.2 MG/ML IJ SOLN
INTRAMUSCULAR | Status: DC | PRN
  Administered 2012-12-17: 0.6 mg via INTRAVENOUS

## 2012-12-17 MED ORDER — BUPIVACAINE-EPINEPHRINE 0.25% -1:200000 IJ SOLN
INTRAMUSCULAR | Status: DC | PRN
  Administered 2012-12-17: 20 mL

## 2012-12-17 MED ORDER — CEFAZOLIN SODIUM-DEXTROSE 2-3 GM-% IV SOLR
2.0000 g | Freq: Once | INTRAVENOUS | Status: AC
  Administered 2012-12-17: 2 g via INTRAVENOUS
  Filled 2012-12-17: qty 50

## 2012-12-17 MED ORDER — 0.9 % SODIUM CHLORIDE (POUR BTL) OPTIME
TOPICAL | Status: DC | PRN
  Administered 2012-12-17: 1000 mL

## 2012-12-17 MED ORDER — FENTANYL CITRATE 0.05 MG/ML IJ SOLN
INTRAMUSCULAR | Status: DC | PRN
  Administered 2012-12-17: 100 ug via INTRAVENOUS
  Administered 2012-12-17: 50 ug via INTRAVENOUS
  Administered 2012-12-17: 100 ug via INTRAVENOUS

## 2012-12-17 MED ORDER — NEOSTIGMINE METHYLSULFATE 1 MG/ML IJ SOLN
INTRAMUSCULAR | Status: DC | PRN
  Administered 2012-12-17: 4 mg via INTRAVENOUS

## 2012-12-17 SURGICAL SUPPLY — 40 items
APL SKNCLS STERI-STRIP NONHPOA (GAUZE/BANDAGES/DRESSINGS) ×2
APPLIER CLIP 5 13 M/L LIGAMAX5 (MISCELLANEOUS) ×3
APR CLP MED LRG 5 ANG JAW (MISCELLANEOUS) ×2
BAG SPEC RTRVL LRG 6X4 10 (ENDOMECHANICALS)
BANDAGE ADHESIVE 1X3 (GAUZE/BANDAGES/DRESSINGS) ×12 IMPLANT
BENZOIN TINCTURE PRP APPL 2/3 (GAUZE/BANDAGES/DRESSINGS) ×3 IMPLANT
CANISTER SUCTION 2500CC (MISCELLANEOUS) ×3 IMPLANT
CHLORAPREP W/TINT 26ML (MISCELLANEOUS) ×3 IMPLANT
CLIP APPLIE 5 13 M/L LIGAMAX5 (MISCELLANEOUS) ×2 IMPLANT
CLOTH BEACON ORANGE TIMEOUT ST (SAFETY) ×3 IMPLANT
COVER MAYO STAND STRL (DRAPES) ×2 IMPLANT
COVER SURGICAL LIGHT HANDLE (MISCELLANEOUS) ×3 IMPLANT
DECANTER SPIKE VIAL GLASS SM (MISCELLANEOUS) ×3 IMPLANT
DRAPE C-ARM 42X72 X-RAY (DRAPES) ×2 IMPLANT
ELECT REM PT RETURN 9FT ADLT (ELECTROSURGICAL) ×3
ELECTRODE REM PT RTRN 9FT ADLT (ELECTROSURGICAL) ×2 IMPLANT
GLOVE BIO SURGEON STRL SZ7.5 (GLOVE) ×6 IMPLANT
GLOVE BIOGEL PI IND STRL 7.0 (GLOVE) ×2 IMPLANT
GLOVE BIOGEL PI IND STRL 7.5 (GLOVE) ×2 IMPLANT
GLOVE BIOGEL PI INDICATOR 7.0 (GLOVE) ×1
GLOVE BIOGEL PI INDICATOR 7.5 (GLOVE) ×2
GLOVE SURG SIGNA 7.5 PF LTX (GLOVE) ×3 IMPLANT
GOWN STRL NON-REIN LRG LVL3 (GOWN DISPOSABLE) ×6 IMPLANT
GOWN STRL REIN XL XLG (GOWN DISPOSABLE) ×3 IMPLANT
KIT BASIN OR (CUSTOM PROCEDURE TRAY) ×3 IMPLANT
KIT ROOM TURNOVER OR (KITS) ×3 IMPLANT
NS IRRIG 1000ML POUR BTL (IV SOLUTION) ×3 IMPLANT
PAD ARMBOARD 7.5X6 YLW CONV (MISCELLANEOUS) ×3 IMPLANT
POUCH SPECIMEN RETRIEVAL 10MM (ENDOMECHANICALS) IMPLANT
SCISSORS LAP 5X35 DISP (ENDOMECHANICALS) IMPLANT
SET CHOLANGIOGRAPH 5 50 .035 (SET/KITS/TRAYS/PACK) ×2 IMPLANT
SET IRRIG TUBING LAPAROSCOPIC (IRRIGATION / IRRIGATOR) ×3 IMPLANT
SLEEVE ENDOPATH XCEL 5M (ENDOMECHANICALS) ×6 IMPLANT
SPECIMEN JAR SMALL (MISCELLANEOUS) ×3 IMPLANT
SUT MON AB 4-0 PC3 18 (SUTURE) ×3 IMPLANT
TOWEL OR 17X24 6PK STRL BLUE (TOWEL DISPOSABLE) ×3 IMPLANT
TOWEL OR 17X26 10 PK STRL BLUE (TOWEL DISPOSABLE) ×3 IMPLANT
TRAY LAPAROSCOPIC (CUSTOM PROCEDURE TRAY) ×3 IMPLANT
TROCAR XCEL BLUNT TIP 100MML (ENDOMECHANICALS) ×3 IMPLANT
TROCAR XCEL NON-BLD 5MMX100MML (ENDOMECHANICALS) ×3 IMPLANT

## 2012-12-17 NOTE — Progress Notes (Signed)
3 Days Post-Op  Subjective: Pt is very frustrated wants to go home.  Explained nature of illness and need for laparoscopic cholecystectomy.  States if he does not have surgery today, he will sign out AMA.  Denies abdominal pain, nausea or vomiting.    Objective: Vital signs in last 24 hours: Temp:  [98.1 F (36.7 C)-98.9 F (37.2 C)] 98.2 F (36.8 C) (07/21 0600) Pulse Rate:  [74-93] 74 (07/21 0600) Resp:  [18] 18 (07/21 0600) BP: (132-147)/(85-103) 132/85 mmHg (07/21 0600) SpO2:  [100 %] 100 % (07/21 0600) Last BM Date: 12/10/12  Intake/Output from previous day: 07/20 0701 - 07/21 0700 In: 1320 [P.O.:1320] Out: 2400 [Urine:2400] Intake/Output this shift:   General appearance: alert and cooperative  Resp: clear to auscultation bilaterally  GI: soft, flat and non tender. Neuro: mild agitation  BMET  Recent Labs  12/16/12 0518 12/17/12 0525  NA 143 139  K 3.6 3.6  CL 110 109  CO2 25 22  GLUCOSE 84 87  BUN 6 10  CREATININE 0.93 0.86  CALCIUM 8.9 8.9   Lab Results  Component Value Date   ALT 156* 12/17/2012   AST 138* 12/17/2012   ALKPHOS 808* 12/17/2012   BILITOT 4.6* 12/17/2012   Lipase     Component Value Date/Time   LIPASE 133* 12/17/2012 0525      PT/INR  Recent Labs  12/14/12 1100  LABPROT 13.3  INR 1.03   Anti-infectives: Anti-infectives   Start     Dose/Rate Route Frequency Ordered Stop   12/14/12 1730  ciprofloxacin (CIPRO) IVPB 400 mg     400 mg 200 mL/hr over 60 Minutes Intravenous  Once 12/14/12 1719 12/14/12 1843   12/14/12 1600  ciprofloxacin (CIPRO) IVPB 400 mg     400 mg 200 mL/hr over 60 Minutes Intravenous  Once 12/14/12 1553 12/14/12 1550   12/14/12 1000  azithromycin (ZITHROMAX) tablet 1,200 mg     1,200 mg Oral Every Fri 12/13/12 1653     12/14/12 1000  sulfamethoxazole-trimethoprim (BACTRIM DS) 800-160 MG per tablet 1 tablet     1 tablet Oral Every M-W-F 12/13/12 1726     12/14/12 0900  sulfamethoxazole-trimethoprim (BACTRIM  DS) 800-160 MG per tablet 1 tablet  Status:  Discontinued     1 tablet Oral Once per day on Mon Wed Fri 12/13/12 1653 12/13/12 1709   12/13/12 1800  dolutegravir (TIVICAY) tablet 50 mg  Status:  Discontinued     50 mg Oral Daily 12/13/12 1653 12/13/12 1708   12/13/12 1800  fluconazole (DIFLUCAN) tablet 100 mg  Status:  Discontinued     100 mg Oral Daily 12/13/12 1653 12/13/12 1726   12/13/12 1800  valGANciclovir (VALCYTE) 450 MG tablet TABS 450 mg     450 mg Oral 2 times daily with meals 12/13/12 1653     12/13/12 1800  emtricitabine-tenofovir (TRUVADA) 200-300 MG per tablet 1 tablet     1 tablet Oral Daily 12/13/12 1726     12/13/12 1800  dolutegravir (TIVICAY) tablet 50 mg     50 mg Oral Daily 12/13/12 1726     12/13/12 1800  fluconazole (DIFLUCAN) tablet 100 mg     100 mg Oral Every Thu 12/13/12 1726     12/13/12 1700  emtricitabine-tenofovir (TRUVADA) 200-300 MG per tablet 1 tablet  Status:  Discontinued     1 tablet Oral Daily 12/13/12 1653 12/13/12 1708      Assessment/Plan: Transaminitis Pancreatitis following ERCP cholelithiasis -s/p ERCP(7/18) multiple CBD  stone s/p extraction and sphincterotomy  -lipase  128--->133 this AM.  LFTs still remain elevated -i am not certain whether he is medically stable for surgery at this time. I voiced my concern to the patient, he insists that he will sign out AMA if the surgery is not done today.  I will discuss with Dr. Magnus Ivan further plans for surgery. -continue NPO, IVF and pain control -VTE prophylaxis-lovenox    LOS: 4 days    Signa Cheek, [facility] 12/17/2012

## 2012-12-17 NOTE — Progress Notes (Signed)
[facility] PROGRESS NOTE  Stephen Griffin NWG:[id number] DOB: June 22, 1980 DOA: 12/13/2012 PCP: No PCP Per Patient  Brief narrative:  32 year old transgender with past medical history of poorly controlled HIV/AIDS, esophageal candidiasis, alcohol abuse, nicotine abuse, noncompliance, discharged from IM teaching service 3 weeks back after admitted for etoh pancreatitis, Patient followed up with Dr. Algis Liming on 7/16 and was complaining of abdominal pain. Patient had repeat LFTs done in the office and were noticed to be extremely high and sent to the hospital for further w/up.   Assessment/Plan:  Biliary pancreatitis  ERCP on 7/18 showed multiple CBD stone s/p extraction and sphincterotomy.  - abdominal ultrasound done during the previous admission showed cholelithiasis without cholecystitis, new mild biliary dilatation and ill-defined enlargement of pancreatic head likely pancreatitis.  - Continue IV fluids, pain control and antiemetics.  Appreciate GI eval. Surgery consulted for cholecystectomy.  -lipase and transaminases improving slowly . abdominal pain resolved.   lap choly with IOC and liver bx today  Active Problems:  HIV AIDS  CD4 of 10 only. Patient non compliant with meds. Appreciate ID recs. Acute hepatitis panel sent  - ID consulted. Resumed ART. Bactrim and azithro for prophylaxis.  AFB blood cx sent   Alcohol abuse  -counseled on etoh cessation. No signs of withdrawal   Tobacco abuse  counseled on cessations. On nicotine patch   DVT prophylaxis: Heparin subcutaneous   CODE STATUS: Full code  Family Communication: none at bedside  Disposition Plan: home once stable   Consultants:  ID  [facility]  [facility]    Procedures:  For ERCP on 7/18  Possibly lap choly today  Antibiotics:  Bactrim and azithromycin for prophylaxis   HPI/Subjective:  No overnight issues. No epigastric pain today   Objective: Filed Vitals:   12/16/12 1350 12/16/12 2200 12/17/12 0600  12/17/12 1120  BP: 141/94 147/103 132/85 136/95  Pulse: 80 93 74 69  Temp: 98.9 F (37.2 C) 98.1 F (36.7 C) 98.2 F (36.8 C) 97.9 F (36.6 C)  TempSrc: Oral Oral  Oral  Resp: 18 18 18 17   Height:      Weight:      SpO2: 100% 100% 100% 100%    Intake/Output Summary (Last 24 hours) at 12/17/12 1344 Last data filed at 12/17/12 0700  Gross per 24 hour  Intake    600 ml  Output   1700 ml  Net  -1100 ml   Filed Weights   12/13/12 1600  Weight: 56.25 kg (124 lb 0.1 oz)    Exam:   General: Middle aged male in NAD  HEENT: no pallor, moist mucosa, no oral thrush  Chest: clear b/l, no added sounds  CVS: NS1&S2, no murmurs, rubs or gallop  ABD: soft,no epigastric tenderness , BS+ EXT: warm, no edema  CNS: AAOX3   Data Reviewed: Basic Metabolic Panel:  Recent Labs Lab 12/13/12 1915 12/14/12 0430 12/15/12 0555 12/16/12 0518 12/17/12 0525  NA 144 144 143 143 139  K 3.1* 3.6 3.3* 3.6 3.6  CL 108 110 109 110 109  CO2 30 29 27 25 22   GLUCOSE 108* 78 108* 84 87  BUN 11 8 5* 6 10  CREATININE 0.90 0.85 0.84 0.93 0.86  CALCIUM 8.7 8.7 8.5 8.9 8.9   Liver Function Tests:  Recent Labs Lab 12/13/12 1915 12/14/12 0430 12/15/12 0555 12/16/12 0518 12/17/12 0525  AST 168* 185* 274* 135* 138*  ALT 170* 164* 223* 168* 156*  ALKPHOS 1007* 891* 906* 884* 808*  BILITOT  0.4 0.3 2.6* 4.5* 4.6*  PROT 7.8 6.9 6.4 6.4 6.3  ALBUMIN 3.2* 2.7* 2.5* 2.5* 2.4*    Recent Labs Lab 12/13/12 1915 12/15/12 0555 12/16/12 0518 12/17/12 0525  LIPASE 244* 612* 128* 133*   No results found for this basename: AMMONIA,  in the last 168 hours CBC:  Recent Labs Lab 12/12/12 1547 12/13/12 1915 12/14/12 0430  WBC 2.3* 2.7* 3.4*  NEUTROABS 1.4*  --   --   HGB 14.5 14.3 14.0  HCT 42.6 44.9 41.9  MCV 90.4 95.5 94.2  PLT 215 218 198   Cardiac Enzymes: No results found for this basename: CKTOTAL, CKMB, CKMBINDEX, TROPONINI,  in the last 168 hours BNP (last 3 results) No results  found for this basename: PROBNP,  in the last 8760 hours CBG: No results found for this basename: GLUCAP,  in the last 168 hours  Recent Results (from the past 240 hour(s))  AFB CULTURE, BLOOD     Status: None   Collection Time    12/12/12  3:48 PM      Result Value Range Status   Preliminary Report Culture will be examined for 6 weeks before    Preliminary   Preliminary Report issuing a Final Report.   Preliminary  AFB CULTURE, BLOOD     Status: None   Collection Time    12/13/12  7:15 PM      Result Value Range Status   Specimen Description BLOOD LEFT ANTECUBITAL   Final   Special Requests AFB ONLY/5CC   Final   Culture     Final   Value: CULTURE WILL BE EXAMINED FOR 6 WEEKS BEFORE ISSUING A FINAL REPORT   Report Status PENDING   Incomplete  URINE CULTURE     Status: None   Collection Time    12/13/12  9:57 PM      Result Value Range Status   Specimen Description URINE, CATHETERIZED   Final   Special Requests NONE   Final   Culture  Setup Time 12/13/2012 22:22   Final   Colony Count NO GROWTH   Final   Culture NO GROWTH   Final   Report Status 12/14/2012 FINAL   Final     Studies: No results found.  Scheduled Meds: . azithromycin  1,200 mg Oral Q Fri  . dolutegravir  50 mg Oral Daily  . emtricitabine-tenofovir  1 tablet Oral Daily  . enoxaparin (LOVENOX) injection  40 mg Subcutaneous Q24H  . fluconazole  100 mg Oral Q Thu  . megestrol  800 mg Oral Daily  . nicotine  21 mg Transdermal Daily  . nystatin  5 mL Oral QID  . pantoprazole  20 mg Oral Daily  . sulfamethoxazole-trimethoprim  1 tablet Oral Q M,W,F  . traZODone  50 mg Oral QHS  . valGANciclovir  450 mg Oral BID WC   Continuous Infusions: . sodium chloride 100 mL/hr at 12/13/12 1921  . lactated ringers 50 mL/hr at 12/16/12 1231  . lactated ringers 20 mL/hr at 12/17/12 1231      Time spent: 25 MINUTES    Eddie North  [facility] Pager [phone] If 7PM-7AM, please contact night-coverage  at [email or web address], password [facility] 12/17/2012, 1:44 PM  LOS: 4 days

## 2012-12-17 NOTE — Preoperative (Signed)
Beta Blockers   Reason not to administer Beta Blockers:Not Applicable 

## 2012-12-17 NOTE — Progress Notes (Addendum)
[facility] Progress Note  Patient Name: Stephen Griffin Date of Encounter: 12/17/2012, 8:30 PM    Subjective  Sore post lap chole but overall says feels better   Objective    Physical Exam: Filed Vitals:   12/17/12 1515  BP: 144/103  Pulse: 74  Temp: 97.9 F (36.6 C)  Resp: 16   General: NAD     Intake/Output Summary (Last 24 hours) at 12/17/12 2030 Last data filed at 12/17/12 1355  Gross per 24 hour  Intake    900 ml  Output   1200 ml  Net   -300 ml    Labs:  Recent Labs  12/16/12 0518 12/17/12 0525  NA 143 139  K 3.6 3.6  CL 110 109  CO2 25 22  GLUCOSE 84 87  BUN 6 10  CREATININE 0.93 0.86  CALCIUM 8.9 8.9    Recent Labs  12/16/12 0518 12/17/12 0525  AST 135* 138*  ALT 168* 156*  ALKPHOS 884* 808*  BILITOT 4.5* 4.6*  PROT 6.4 6.3  ALBUMIN 2.5* 2.4*    Recent Labs  12/16/12 0518 12/17/12 0525  LIPASE 128* 133*       Radiology/Studies:  Dg Cholangiogram Operative  12/17/2012   *RADIOLOGY REPORT*  Clinical Data: Cholelithiasis.  INTRAOPERATIVE CHOLANGIOGRAM  Technique:  Multiple fluoroscopic spot radiographs were obtained during intraoperative cholangiogram and are submitted for interpretation post-operatively.  Comparison: Ultrasound dated 11/21/2012 and ERCP dated 12/14/2012  Findings: There is marked irregularity of the visualized bile ducts suggesting cholangitis.  There is no flow of contrast into the duodenum through the ampulla.  No visible filling defects. Contrast extravasated from the gallbladder during the exam.  IMPRESSION: Irregular bile ducts consistent with cholangitis.  No flow of contrast into the duodenum.  However, there is no visible stone in the bile duct.   Original Report Authenticated By: Francene Boyers, M.D.     Dg Ercp Biliary & Pancreatic Ducts  12/14/2012   *RADIOLOGY REPORT*  Clinical Data: Common duct stones, ERCP  ERCP  Comparison: Abdominal ultrasound 11/21/2012  Findings: Five intraprocedural  fluoroscopic images are provided, demonstrating no dilatation of the common or proximal intrahepatic ducts.  There is mild ductal irregularity without focal stricture or evidence for extrinsic mass effect.  No filling defect is identified.  Minimal passage of contrast into duodenum is noted. Balloon sweep was performed.  IMPRESSION: No filling defect or ductal dilatation.  However, the wall of the common duct and proximal intrahepatic ducts is irregular in appearance which raises the question of cholangitis, autoimmune disorder, or much less likely infiltrative neoplasm.  This could also be a side effect of therapy if the patient is currently being treated for the reported history of HIV, or AIDS-related cholangiopathy.   Original Report Authenticated By: Christiana Pellant, M.D.     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 Alk phos is going to remain high. Biliary sphincterotomy may help here. 3) Alcohol and drug abuse - ? Role in pancreatitis   She needs to resume HAART Tx and get T cells up which can help with AIDS cholangiopathy. No further GI input at this time. Call us back if ?'s  Iva Boop, MD, [facility] [phone] (pager) 12/17/2012 8:35 PM

## 2012-12-17 NOTE — Op Note (Signed)
Laparoscopic Cholecystectomy with IOC Procedure Note  Indications: This patient presents with symptomatic gallbladder disease and will undergo laparoscopic cholecystectomy. Had preop ERCP  Pre-operative Diagnosis: Calculus of gallbladder with acute cholecystitis, without mention of obstruction  Post-operative Diagnosis: Same  Surgeon: Abigail Miyamoto A   Assistants: 0  Anesthesia: General endotracheal anesthesia  ASA Class: 3  Procedure Details  The patient was seen again in the Holding Room. The risks, benefits, complications, treatment options, and expected outcomes were discussed with the patient. The possibilities of reaction to medication, pulmonary aspiration, perforation of viscus, bleeding, recurrent infection, finding a normal gallbladder, the need for additional procedures, failure to diagnose a condition, the possible need to convert to an open procedure, and creating a complication requiring transfusion or operation were discussed with the patient. The likelihood of improving the patient's symptoms with return to their baseline status is good.  The patient and/or family concurred with the proposed plan, giving informed consent. The site of surgery properly noted. The patient was taken to Operating Room, identified as Stephen Griffin and the procedure verified as Laparoscopic Cholecystectomy with Intraoperative Cholangiogram. A Time Out was held and the above information confirmed.  Prior to the induction of general anesthesia, antibiotic prophylaxis was administered. General endotracheal anesthesia was then administered and tolerated well. After the induction, the abdomen was prepped with Chloraprep and draped in the sterile fashion. The patient was positioned in the supine position.  Local anesthetic agent was injected into the skin near the umbilicus and an incision made. We dissected down to the abdominal fascia with blunt dissection.  The fascia was incised vertically and we  entered the peritoneal cavity bluntly.  A pursestring suture of 0-Vicryl was placed around the fascial opening.  The Hasson cannula was inserted and secured with the stay suture.  Pneumoperitoneum was then created with CO2 and tolerated well without any adverse changes in the patient's vital signs. An 11-mm port was placed in the subxiphoid position.  Two 5-mm ports were placed in the right upper quadrant. All skin incisions were infiltrated with a local anesthetic agent before making the incision and placing the trocars.   We positioned the patient in reverse Trendelenburg, tilted slightly to the patient's left.  The gallbladder was identified, the fundus grasped and retracted cephalad. Adhesions were lysed bluntly and with the electrocautery where indicated, taking care not to injure any adjacent organs or viscus. The infundibulum was grasped and retracted laterally, exposing the peritoneum overlying the triangle of Calot. This was then divided and exposed in a blunt fashion. A critical view of the cystic duct and cystic artery was obtained.  The cystic duct was clearly identified and bluntly dissected circumferentially. The cystic duct was ligated with a clip distally.   An incision was made in the cystic duct and the [facility] cholangiogram catheter introduced. The catheter was secured using a clip. A cholangiogram was then obtained which showed good visualization of the distal and proximal biliary tree with no sign of filling defects or obstruction.  The bile duct was abnormal in appearance consistent with the images seen on the ERCP.  I could not get contrast to flow into the duodenum. It had the appearance more like spasm than a retained stone. The catheter was then removed.   The cystic duct was then ligated with clips and divided. The cystic artery was identified, dissected free, ligated with clips and divided as well.   The gallbladder was dissected from the liver bed in retrograde fashion with the  electrocautery. The gallbladder was removed and placed in an Endocatch sac. The liver bed was irrigated and inspected. Hemostasis was achieved with the electrocautery. Copious irrigation was utilized and was repeatedly aspirated until clear.  The gallbladder and Endocatch sac were then removed through the umbilical port site.  The pursestring suture was used to close the umbilical fascia.    We again inspected the right upper quadrant for hemostasis.  Pneumoperitoneum was released as we removed the trocars.  4-0 Monocryl was used to close the skin.   Benzoin, steri-strips, and clean dressings were applied. The patient was then extubated and brought to the recovery room in stable condition. Instrument, sponge, and needle counts were correct at closure and at the conclusion of the case.   Findings: Cholecystitis with Cholelithiasis. Normal appearing liver Abnormal appearance of the CBD.  Estimated Blood Loss: Minimal         Drains: 0         Specimens: Gallbladder           Complications: None; patient tolerated the procedure well.         Disposition: PACU - hemodynamically stable.         Condition: stable

## 2012-12-17 NOTE — Progress Notes (Signed)
For lap  Chole and IOC, possible liver biopsy today.  Alk phos, AST and ALT still  Elevated Will follow up after surgery.  Jennye Moccasin

## 2012-12-17 NOTE — Progress Notes (Signed)
Pt transported to OR via bed, accompanied by family.  Shea Swalley [employer]

## 2012-12-17 NOTE — Progress Notes (Signed)
Received patient from PACU,  Alert and oriented, vital signs stable,  Pt asking for juice, no compliants voiced at this time. Del Wiseman [employer]

## 2012-12-17 NOTE — Anesthesia Preprocedure Evaluation (Signed)
Anesthesia Evaluation  Patient identified by MRN, date of birth, ID band Patient awake    Reviewed: Allergy & Precautions, H&P , NPO status , Patient's Chart, lab work & pertinent test results  Airway Mallampati: II  Neck ROM: full    Dental   Pulmonary asthma , Current Smoker,          Cardiovascular DVT     Neuro/Psych  Headaches,  Neuromuscular disease    GI/Hepatic   Endo/Other    Renal/GU      Musculoskeletal   Abdominal   Peds  Hematology  (+) HIV,   Anesthesia Other Findings   Reproductive/Obstetrics                           Anesthesia Physical Anesthesia Plan  ASA: III  Anesthesia Plan: General   Post-op Pain Management:    Induction: Intravenous  Airway Management Planned: Oral ETT  Additional Equipment:   Intra-op Plan:   Post-operative Plan: Extubation in OR  Informed Consent: I have reviewed the patients History and Physical, chart, labs and discussed the procedure including the risks, benefits and alternatives for the proposed anesthesia with the patient or authorized representative who has indicated his/her understanding and acceptance.     Plan Discussed with: CRNA, Anesthesiologist and Surgeon  Anesthesia Plan Comments:         Anesthesia Quick Evaluation

## 2012-12-17 NOTE — Progress Notes (Signed)
Patient ID: Stephen Griffin, male   DOB: 08-01-1980, 32 y.o.   MRN: [id number]         [facility]    Date of Admission:  12/13/2012     Principal Problem:   Acute biliary pancreatitis Active Problems:   HIV DISEASE   ASTHMA   Alcohol abuse   Pancreatitis   Elevated alkaline phosphatase level   Transaminitis   Nicotine dependence   Abdominal pain   Polysubstance abuse   Cigarette smoker   Calculus of bile duct without mention of cholecystitis or obstruction   AIDS   . azithromycin  1,200 mg Oral Q Fri  . dolutegravir  50 mg Oral Daily  . emtricitabine-tenofovir  1 tablet Oral Daily  . enoxaparin (LOVENOX) injection  40 mg Subcutaneous Q24H  . fluconazole  100 mg Oral Q Thu  . megestrol  800 mg Oral Daily  . nicotine  21 mg Transdermal Daily  . nystatin  5 mL Oral QID  . pantoprazole  20 mg Oral Daily  . sulfamethoxazole-trimethoprim  1 tablet Oral Q M,W,F  . traZODone  50 mg Oral QHS  . valGANciclovir  450 mg Oral BID WC   Objective: Temp:  [97.1 F (36.2 C)-98.8 F (37.1 C)] 97.9 F (36.6 C) (07/21 1515) Pulse Rate:  [69-93] 74 (07/21 1515) Resp:  [11-18] 16 (07/21 1515) BP: (132-154)/(85-103) 144/103 mmHg (07/21 1515) SpO2:  [99 %-100 %] 99 % (07/21 1515)  Lab Results Lab Results  Component Value Date   WBC 3.4* 12/14/2012   HGB 14.0 12/14/2012   HCT 41.9 12/14/2012   MCV 94.2 12/14/2012   PLT 198 12/14/2012    Lab Results  Component Value Date   CREATININE 0.86 12/17/2012   BUN 10 12/17/2012   NA 139 12/17/2012   K 3.6 12/17/2012   CL 109 12/17/2012   CO2 22 12/17/2012    Lab Results  Component Value Date   ALT 156* 12/17/2012   AST 138* 12/17/2012   ALKPHOS 808* 12/17/2012   BILITOT 4.6* 12/17/2012    Studies/Results: Dg Cholangiogram Operative  12/17/2012   *RADIOLOGY REPORT*  Clinical Data: Cholelithiasis.  INTRAOPERATIVE CHOLANGIOGRAM  Technique:  Multiple fluoroscopic spot radiographs were obtained during intraoperative  cholangiogram and are submitted for interpretation post-operatively.  Comparison: Ultrasound dated 11/21/2012 and ERCP dated 12/14/2012  Findings: There is marked irregularity of the visualized bile ducts suggesting cholangitis.  There is no flow of contrast into the duodenum through the ampulla.  No visible filling defects. Contrast extravasated from the gallbladder during the exam.  IMPRESSION: Irregular bile ducts consistent with cholangitis.  No flow of contrast into the duodenum.  However, there is no visible stone in the bile duct.   Original Report Authenticated By: Francene Boyers, M.D.    Assessment: She underwent laparoscopic cholecystectomy today. I would resume her oral HIV medications and possible postoperatively. I will arrange followup in clinic with Dr. Daiva Eves shortly after discharge.  Plan: 1. Resume oral HIV medicines and possible 2. I will arrange followup shortly after discharge with Dr. Daiva Eves 3. I will sign off now.  Cliffton Asters, MD [facility] [phone] pager   [phone] cell 12/17/2012, 4:44 PM

## 2012-12-17 NOTE — Anesthesia Postprocedure Evaluation (Signed)
  Anesthesia Post-op Note  Patient: Stephen Griffin  Procedure(s) Performed: Procedure(s): LAPAROSCOPIC CHOLECYSTECTOMY WITH INTRAOPERATIVE CHOLANGIOGRAM (N/A)  Patient Location: PACU  Anesthesia Type:General  Level of Consciousness: awake, alert  and oriented  Airway and Oxygen Therapy: Patient Spontanous Breathing and Patient connected to nasal cannula oxygen  Post-op Pain: mild  Post-op Assessment: Post-op Vital signs reviewed, Patient's Cardiovascular Status Stable, Respiratory Function Stable, Patent Airway and Pain level controlled  Post-op Vital Signs: stable  Complications: No apparent anesthesia complications

## 2012-12-17 NOTE — Progress Notes (Signed)
Pt requesting regular dinner tray, has been up to bathroom, and voided, moving about well.  Stephen Griffin [employer]

## 2012-12-17 NOTE — Transfer of Care (Signed)
Immediate Anesthesia Transfer of Care Note  Patient: Stephen Griffin  Procedure(s) Performed: Procedure(s): LAPAROSCOPIC CHOLECYSTECTOMY WITH INTRAOPERATIVE CHOLANGIOGRAM (N/A)  Patient Location: PACU  Anesthesia Type:General  Level of Consciousness: awake, alert , oriented and sedated  Airway & Oxygen Therapy: Patient Spontanous Breathing and Patient connected to face mask oxygen  Post-op Assessment: Report given to PACU RN, Post -op Vital signs reviewed and stable and Patient moving all extremities  Post vital signs: Reviewed and stable  Complications: No apparent anesthesia complications

## 2012-12-17 NOTE — Progress Notes (Signed)
I have seen and examined the patient and agree with the assessment and plans. Clinically improved Will proceed with lap chole and c-gram, possible liver biopsy today. I discussed the risks with him in detail and he agrees to proceed.  Erianna Jolly A. Magnus Ivan  MD, FACS

## 2012-12-18 ENCOUNTER — Encounter (HOSPITAL_COMMUNITY): Payer: Self-pay | Admitting: Surgery

## 2012-12-18 LAB — COMPREHENSIVE METABOLIC PANEL
ALT: 115 U/L — ABNORMAL HIGH (ref 0–53)
AST: 80 U/L — ABNORMAL HIGH (ref 0–37)
BUN: 6 mg/dL (ref 6–23)
CO2: 24 mEq/L (ref 19–32)
CO2: 24 mEq/L (ref 19–32)
Calcium: 8.8 mg/dL (ref 8.4–10.5)
Chloride: 105 mEq/L (ref 96–112)
Creatinine, Ser: 0.94 mg/dL (ref 0.50–1.35)
GFR calc non Af Amer: >90 mL/min (ref >90)
GFR calc non Af Amer: >90 mL/min (ref >90)
Potassium: 3.3 mEq/L — ABNORMAL LOW (ref 3.5–5.1)
Sodium: 139 mEq/L (ref 135–145)
Total Bilirubin: 1.9 mg/dL — ABNORMAL HIGH (ref 0.3–1.2)
Total Protein: 6.4 g/dL (ref 6.0–8.3)

## 2012-12-18 LAB — CBC
MCH: 31.6 pg (ref 26.0–34.0)
Platelets: 209 10*3/uL (ref 150–400)
RBC: 3.7 MIL/uL — ABNORMAL LOW (ref 4.22–5.81)

## 2012-12-18 LAB — LIPASE, BLOOD: Lipase: 54 U/L (ref 11–59)

## 2012-12-18 MED ORDER — HYDRALAZINE HCL 25 MG PO TABS
25.0000 mg | ORAL_TABLET | Freq: Three times a day (TID) | ORAL | Status: DC
  Administered 2012-12-18 – 2012-12-19 (×3): 25 mg via ORAL
  Filled 2012-12-18 (×6): qty 1

## 2012-12-18 MED ORDER — AMLODIPINE BESYLATE 10 MG PO TABS
10.0000 mg | ORAL_TABLET | Freq: Every day | ORAL | Status: DC
  Administered 2012-12-18 – 2012-12-19 (×2): 10 mg via ORAL
  Filled 2012-12-18 (×2): qty 1

## 2012-12-18 MED ORDER — SENNOSIDES-DOCUSATE SODIUM 8.6-50 MG PO TABS
1.0000 | ORAL_TABLET | Freq: Two times a day (BID) | ORAL | Status: DC
  Administered 2012-12-18 – 2012-12-19 (×3): 1 via ORAL
  Filled 2012-12-18 (×3): qty 1

## 2012-12-18 MED ORDER — POTASSIUM CHLORIDE CRYS ER 20 MEQ PO TBCR
40.0000 meq | EXTENDED_RELEASE_TABLET | Freq: Once | ORAL | Status: AC
  Administered 2012-12-18: 40 meq via ORAL
  Filled 2012-12-18: qty 2

## 2012-12-18 MED ORDER — HYDRALAZINE HCL 10 MG PO TABS: 10.0000 mg | ORAL_TABLET | Freq: Three times a day (TID) | ORAL | Status: DC

## 2012-12-18 NOTE — Progress Notes (Signed)
I have seen and examined the patient and agree with the assessment and plans.  Syann Cupples A. Renie Stelmach  MD, FACS  

## 2012-12-18 NOTE — Progress Notes (Addendum)
[facility] PROGRESS NOTE  Stephen Griffin EAV:[id number] DOB: 1981-02-26 DOA: 12/13/2012 PCP: No PCP Per Patient  Brief narrative:  32 year old transgender with past medical history of poorly controlled HIV/AIDS, esophageal candidiasis, alcohol abuse, nicotine abuse, noncompliance, discharged from IM teaching service 3 weeks back after admitted for etoh pancreatitis, Patient followed up with Dr. Algis Liming on 7/16 and was complaining of abdominal pain. Patient had repeat LFTs done in the office and were noticed to be extremely high and sent to the hospital for further w/up. Patient had transaminitis along with elevated lipase suggestive of acute pancreatitis. Patient underwent ERCP for biliary pancreatitis with sphincterotomy. Patient then underwent laproscopic cholecystectomy.  Assessment/Plan:  Biliary pancreatitis  ERCP on 7/18 showed multiple CBD stone s/p extraction and sphincterotomy.  - abdominal ultrasound done during the previous admission showed cholelithiasis without cholecystitis, new mild biliary dilatation and ill-defined enlargement of pancreatic head likely pancreatitis.  - Continued IV fluids, pain control and antiemetics.  Appreciate GI eval. Surgery consulted for cholecystectomy.  -lipase and transaminases improving slowly .  lap choly with IOC and liver bx done on 7/21. Patient tolerated the procedure well however has issues with uncontrolled pain since last evening. -LFTs have been trending down well. Been markedly improved with oxycodone IR and placed on IV Dilaudid when necessary. -Will monitor patient today for adequate pain control. Added bowel regimen.  Active Problems:  HIV AIDS  CD4 of 10 only. Patient non compliant with meds. Appreciate ID recs. Acute hepatitis panel negative. - ID consulted. Resumed ART. Bactrim and azithro for prophylaxis.  AFB blood cx sent .  Hypertension Patient has been noted to have elevated blood pressure since admission. Mostly this  is likely from pain but this has been consistent. I have added amlodipine and hydralazine for better blood pressure control.   Alcohol abuse  -counseled on etoh cessation. No signs of withdrawal   Tobacco abuse  counseled on cessations. On nicotine patch   DVT prophylaxis: Heparin subcutaneous   CODE STATUS: Full code   Family Communication: none bedside  Disposition Plan: home likely tomorrow if being better controlled.  Consultants:  ID  [facility]  [facility]  Procedures:  For ERCP on 7/18  Laproscopic cholecystectomy on 7/21  Antibiotics:  Bactrim and azithromycin for prophylaxis  HPI/Subjective:  Patient had surgery done yesterday. He has been having uncontrolled abdominal pain. Added IV Dilaudid for better pain control. Denies nausea or vomiting.   Objective: Filed Vitals:   12/17/12 2200 12/18/12 0200 12/18/12 0600 12/18/12 0900  BP: 152/105 148/102 150/103 149/103  Pulse: 79 81 87 87  Temp: 98.5 F (36.9 C) 98.9 F (37.2 C) 99.1 F (37.3 C) 100 F (37.8 C)  TempSrc:    Oral  Resp: 16 16 16 16   Height:      Weight:      SpO2: 98% 100% 100% 100%    Intake/Output Summary (Last 24 hours) at 12/18/12 1149 Last data filed at 12/17/12 2200  Gross per 24 hour  Intake    900 ml  Output    350 ml  Net    550 ml   Filed Weights   12/13/12 1600  Weight: 56.25 kg (124 lb 0.1 oz)    Exam:  General: Middle aged male in NAD  HEENT: no pallor, moist mucosa, no oral thrush  Chest: clear b/l, no added sounds  CVS: NS1&S2, no murmurs, rubs or gallop  ABD: soft, surgical site clean. Tender to palpation over epigastric and right upper quadrant  area , BS+  EXT: warm, no edema  CNS: AAOX3   Data Reviewed: Basic Metabolic Panel:  Recent Labs Lab 12/14/12 0430 12/15/12 0555 12/16/12 0518 12/17/12 0525 12/18/12 0610  NA 144 143 143 139 139  K 3.6 3.3* 3.6 3.6 3.3*  CL 110 109 110 109 107  CO2 29 27 25 22 24   GLUCOSE 78 108* 84 87 86  BUN 8 5* 6 10 8    CREATININE 0.85 0.84 0.93 0.86 0.96  CALCIUM 8.7 8.5 8.9 8.9 8.8   Liver Function Tests:  Recent Labs Lab 12/14/12 0430 12/15/12 0555 12/16/12 0518 12/17/12 0525 12/18/12 0610  AST 185* 274* 135* 138* 80*  ALT 164* 223* 168* 156* 115*  ALKPHOS 891* 906* 884* 808* 710*  BILITOT 0.3 2.6* 4.5* 4.6* 2.6*  PROT 6.9 6.4 6.4 6.3 6.4  ALBUMIN 2.7* 2.5* 2.5* 2.4* 2.5*    Recent Labs Lab 12/13/12 1915 12/15/12 0555 12/16/12 0518 12/17/12 0525 12/18/12 0610  LIPASE 244* 612* 128* 133* 54   No results found for this basename: AMMONIA,  in the last 168 hours CBC:  Recent Labs Lab 12/12/12 1547 12/13/12 1915 12/14/12 0430 12/18/12 0610  WBC 2.3* 2.7* 3.4* 4.0  NEUTROABS 1.4*  --   --   --   HGB 14.5 14.3 14.0 11.7*  HCT 42.6 44.9 41.9 33.8*  MCV 90.4 95.5 94.2 91.4  PLT 215 218 198 209   Cardiac Enzymes: No results found for this basename: CKTOTAL, CKMB, CKMBINDEX, TROPONINI,  in the last 168 hours BNP (last 3 results) No results found for this basename: PROBNP,  in the last 8760 hours CBG: No results found for this basename: GLUCAP,  in the last 168 hours  Recent Results (from the past 240 hour(s))  AFB CULTURE, BLOOD     Status: None   Collection Time    12/12/12  3:48 PM      Result Value Range Status   Preliminary Report Culture will be examined for 6 weeks before    Preliminary   Preliminary Report issuing a Final Report.   Preliminary  AFB CULTURE, BLOOD     Status: None   Collection Time    12/13/12  7:15 PM      Result Value Range Status   Specimen Description BLOOD LEFT ANTECUBITAL   Final   Special Requests AFB ONLY/5CC   Final   Culture     Final   Value: CULTURE WILL BE EXAMINED FOR 6 WEEKS BEFORE ISSUING A FINAL REPORT   Report Status PENDING   Incomplete  URINE CULTURE     Status: None   Collection Time    12/13/12  9:57 PM      Result Value Range Status   Specimen Description URINE, CATHETERIZED   Final   Special Requests NONE   Final    Culture  Setup Time 12/13/2012 22:22   Final   Colony Count NO GROWTH   Final   Culture NO GROWTH   Final   Report Status 12/14/2012 FINAL   Final     Studies: Dg Cholangiogram Operative  12/17/2012   *RADIOLOGY REPORT*  Clinical Data: Cholelithiasis.  INTRAOPERATIVE CHOLANGIOGRAM  Technique:  Multiple fluoroscopic spot radiographs were obtained during intraoperative cholangiogram and are submitted for interpretation post-operatively.  Comparison: Ultrasound dated 11/21/2012 and ERCP dated 12/14/2012  Findings: There is marked irregularity of the visualized bile ducts suggesting cholangitis.  There is no flow of contrast into the duodenum through the ampulla.  No visible  filling defects. Contrast extravasated from the gallbladder during the exam.  IMPRESSION: Irregular bile ducts consistent with cholangitis.  No flow of contrast into the duodenum.  However, there is no visible stone in the bile duct.   Original Report Authenticated By: Francene Boyers, M.D.    Scheduled Meds: . amLODipine  10 mg Oral Daily  . azithromycin  1,200 mg Oral Q Fri  . dolutegravir  50 mg Oral Daily  . emtricitabine-tenofovir  1 tablet Oral Daily  . enoxaparin (LOVENOX) injection  40 mg Subcutaneous Q24H  . fluconazole  100 mg Oral Q Thu  . hydrALAZINE  25 mg Oral Q8H  . megestrol  800 mg Oral Daily  . nicotine  21 mg Transdermal Daily  . nystatin  5 mL Oral QID  . pantoprazole  20 mg Oral Daily  . potassium chloride  40 mEq Oral Once  . senna-docusate  1 tablet Oral BID  . sulfamethoxazole-trimethoprim  1 tablet Oral Q M,W,F  . traZODone  50 mg Oral QHS  . valGANciclovir  450 mg Oral BID WC   Continuous Infusions: . sodium chloride 100 mL/hr at 12/13/12 1921  . lactated ringers 50 mL/hr at 12/16/12 1231  . lactated ringers 20 mL/hr at 12/17/12 1231       Time spent: 25 minutes    Eddie North  [facility] Pager [phone]. If 7PM-7AM, please contact night-coverage at [email or web address],  password [facility] 12/18/2012, 11:49 AM  LOS: 5 days

## 2012-12-18 NOTE — Progress Notes (Signed)
Patient ID: Stephen Griffin, male   DOB: 08-23-1980, 32 y.o.   MRN: [id number] 1 Day Post-Op  Subjective: Pt states he has some pain.  Oxy IR not much help last night, but just took some while I was in the room to see if it is better today.  Ate this morning, but caused some abdominal discomfort, no nausea.  Objective: Vital signs in last 24 hours: Temp:  [97.1 F (36.2 C)-99.1 F (37.3 C)] 99.1 F (37.3 C) (07/22 0600) Pulse Rate:  [69-87] 87 (07/22 0600) Resp:  [11-17] 16 (07/22 0600) BP: (136-154)/(95-105) 150/103 mmHg (07/22 0600) SpO2:  [98 %-100 %] 100 % (07/22 0600) Last BM Date: 12/17/12  Intake/Output from previous day: 07/21 0701 - 07/22 0700 In: 900 [I.V.:900] Out: 350 [Urine:350] Intake/Output this shift:    PE: Abd: soft, appropriately tender, +BS, ND, incisions c/d/i with bandaids present  Lab Results:   Recent Labs  12/18/12 0610  WBC 4.0  HGB 11.7*  HCT 33.8*  PLT 209   BMET  Recent Labs  12/17/12 0525 12/18/12 0610  NA 139 139  K 3.6 3.3*  CL 109 107  CO2 22 24  GLUCOSE 87 86  BUN 10 8  CREATININE 0.86 0.96  CALCIUM 8.9 8.8   PT/INR No results found for this basename: LABPROT, INR,  in the last 72 hours CMP     Component Value Date/Time   NA 139 12/18/2012 0610   K 3.3* 12/18/2012 0610   CL 107 12/18/2012 0610   CO2 24 12/18/2012 0610   GLUCOSE 86 12/18/2012 0610   BUN 8 12/18/2012 0610   CREATININE 0.96 12/18/2012 0610   CREATININE 0.92 12/12/2012 1547   CALCIUM 8.8 12/18/2012 0610   PROT 6.4 12/18/2012 0610   ALBUMIN 2.5* 12/18/2012 0610   AST 80* 12/18/2012 0610   ALT 115* 12/18/2012 0610   ALKPHOS 710* 12/18/2012 0610   BILITOT 2.6* 12/18/2012 0610   GFRNONAA >90 12/18/2012 0610   GFRAA >90 12/18/2012 0610   Lipase     Component Value Date/Time   LIPASE 54 12/18/2012 0610       Studies/Results: Dg Cholangiogram Operative  12/17/2012   *RADIOLOGY REPORT*  Clinical Data: Cholelithiasis.  INTRAOPERATIVE CHOLANGIOGRAM  Technique:   Multiple fluoroscopic spot radiographs were obtained during intraoperative cholangiogram and are submitted for interpretation post-operatively.  Comparison: Ultrasound dated 11/21/2012 and ERCP dated 12/14/2012  Findings: There is marked irregularity of the visualized bile ducts suggesting cholangitis.  There is no flow of contrast into the duodenum through the ampulla.  No visible filling defects. Contrast extravasated from the gallbladder during the exam.  IMPRESSION: Irregular bile ducts consistent with cholangitis.  No flow of contrast into the duodenum.  However, there is no visible stone in the bile duct.   Original Report Authenticated By: Francene Boyers, M.D.    Anti-infectives: Anti-infectives   Start     Dose/Rate Route Frequency Ordered Stop   12/17/12 1400  ceFAZolin (ANCEF) IVPB 2 g/50 mL premix     2 g 100 mL/hr over 30 Minutes Intravenous  Once 12/17/12 1250 12/17/12 1310   12/14/12 1730  ciprofloxacin (CIPRO) IVPB 400 mg     400 mg 200 mL/hr over 60 Minutes Intravenous  Once 12/14/12 1719 12/14/12 1843   12/14/12 1600  ciprofloxacin (CIPRO) IVPB 400 mg     400 mg 200 mL/hr over 60 Minutes Intravenous  Once 12/14/12 1553 12/14/12 1550   12/14/12 1000  azithromycin (ZITHROMAX) tablet 1,200 mg  1,200 mg Oral Every Fri 12/13/12 1653     12/14/12 1000  sulfamethoxazole-trimethoprim (BACTRIM DS) 800-160 MG per tablet 1 tablet     1 tablet Oral Every M-W-F 12/13/12 1726     12/14/12 0900  sulfamethoxazole-trimethoprim (BACTRIM DS) 800-160 MG per tablet 1 tablet  Status:  Discontinued     1 tablet Oral Once per day on Mon Wed Fri 12/13/12 1653 12/13/12 1709   12/13/12 1800  dolutegravir (TIVICAY) tablet 50 mg  Status:  Discontinued     50 mg Oral Daily 12/13/12 1653 12/13/12 1708   12/13/12 1800  fluconazole (DIFLUCAN) tablet 100 mg  Status:  Discontinued     100 mg Oral Daily 12/13/12 1653 12/13/12 1726   12/13/12 1800  valGANciclovir (VALCYTE) 450 MG tablet TABS 450 mg     450  mg Oral 2 times daily with meals 12/13/12 1653     12/13/12 1800  emtricitabine-tenofovir (TRUVADA) 200-300 MG per tablet 1 tablet     1 tablet Oral Daily 12/13/12 1726     12/13/12 1800  dolutegravir (TIVICAY) tablet 50 mg     50 mg Oral Daily 12/13/12 1726     12/13/12 1800  fluconazole (DIFLUCAN) tablet 100 mg     100 mg Oral Every Thu 12/13/12 1726     12/13/12 1700  emtricitabine-tenofovir (TRUVADA) 200-300 MG per tablet 1 tablet  Status:  Discontinued     1 tablet Oral Daily 12/13/12 1653 12/13/12 1708       Assessment/Plan  1. S/p lap chole 2. Suspected cholangitis  Plan: 1. LFTs are trending down.  No stone seen on cholangiogram, but did not fill the duodenum, likely secondary to spasm and tortuous duct from cholangitis 2. If patient's pain is well controlled with the oxy IR and he eats better for lunch, he may be dc home this afternoon.  However, if he is still having issues this afternoon with pain control or tolerating his diet, he should likely stay until tomorrow.   LOS: 5 days    Stephen Griffin E 12/18/2012, 8:10 AM Pager: [phone]

## 2012-12-19 MED ORDER — SULFAMETHOXAZOLE-TMP DS 800-160 MG PO TABS: 1.0000 | ORAL_TABLET | ORAL | Status: DC

## 2012-12-19 MED ORDER — OXYCODONE HCL 10 MG PO TABS: 10.0000 mg | ORAL_TABLET | ORAL | Status: DC | PRN

## 2012-12-19 MED ORDER — FLUCONAZOLE 100 MG PO TABS: 100.0000 mg | ORAL_TABLET | ORAL | Status: DC

## 2012-12-19 MED ORDER — AMLODIPINE BESYLATE 10 MG PO TABS: 10.0000 mg | ORAL_TABLET | Freq: Every day | ORAL | Status: DC

## 2012-12-19 MED ORDER — HYDRALAZINE HCL 25 MG PO TABS: 25.0000 mg | ORAL_TABLET | Freq: Three times a day (TID) | ORAL | Status: DC

## 2012-12-19 MED ORDER — ENSURE COMPLETE PO LIQD: 237.0000 mL | Freq: Three times a day (TID) | ORAL | Status: DC

## 2012-12-19 NOTE — Discharge Summary (Signed)
Physician Discharge Summary  Stephen Griffin:[id number] DOB: 21-Jul-1980 DOA: 12/13/2012  PCP: No PCP Per Patient  Admit date: 12/13/2012 Discharge date: 12/19/2012  Time spent: 35 minutes  Recommendations for Outpatient Follow-up:  1. Follow up with surgery  Discharge Diagnoses:  Principal Problem:   Acute biliary pancreatitis Active Problems:   HIV DISEASE   ASTHMA   Alcohol abuse   Pancreatitis   Elevated alkaline phosphatase level   Transaminitis   Nicotine dependence   Abdominal pain   Polysubstance abuse   Cigarette smoker   Calculus of bile duct without mention of cholecystitis or obstruction   AIDS   AIDS cholangiopathy   Discharge Condition: improved  Diet recommendation: regular  Filed Weights   12/13/12 1600  Weight: 56.25 kg (124 lb 0.1 oz)    History of present illness:  Patient is a 32 year old Stephen Griffin with past medical history of poorly controlled HIV/AIDS, esophageal candidiasis, alcohol abuse, nicotine abuse, noncompliance, recent history of pancreatitis, was using significant amount of ibuprofen at the time was dc'ed by IM teaching service on 11/23/2012. Patient followed up with Dr. Algis Liming yesterday and was complaining of abdominal pain. Patient had repeat LFTs done in the office and were noticed to be extremely high. Patient was called by Dr. Algis Liming at home and recommended a direct admit for further evaluation. At the time of my examination, patient has no complaints, feeling hungry. She states that she has been having off-and-on abdominal pain, worse after eating, no nausea or vomiting at this time.   Hospital Course:  Biliary pancreatitis  ERCP on 7/18 showed multiple CBD stone s/p extraction and sphincterotomy.  - abdominal ultrasound done during the previous admission showed cholelithiasis without cholecystitis, new mild biliary dilatation and ill-defined enlargement of pancreatic head likely pancreatitis.  -lipase and transaminases improving  slowly .  lap choly with IOC and liver bx done on 7/21.  -LFTs have been trending down well. Been markedly improved with oxycodone IR and placed on IV Dilaudid when necessary.  -Will monitor patient today for adequate pain control. Added bowel regimen.   HIV AIDS  CD4 of 10 only. Patient non compliant with meds. Appreciated ID recs. Acute hepatitis panel negative.  - ID consulted. Resumed ART. Bactrim and azithro for prophylaxis.  AFB blood cx sent .   Hypertension  Patient has been noted to have elevated blood pressure since admission. Mostly this is likely from pain but this has been consistent.  I have added amlodipine and hydralazine for better blood pressure control.   Alcohol abuse  -counseled on etoh cessation. No signs of withdrawal   Tobacco abuse  counseled on cessations. On nicotine patch    Procedures:  none  Consultations:  Surgery  GI  Discharge Exam: Filed Vitals:   12/18/12 1421 12/18/12 1817 12/18/12 2200 12/19/12 0530  BP: 156/104 153/101 150/97 137/90  Pulse: 80 91 91 93  Temp: 99.2 F (37.3 C) 99.2 F (37.3 C) 98.5 F (36.9 C) 98.6 F (37 C)  TempSrc: Oral Oral Oral Oral  Resp: 16 16 17 18   Height:      Weight:      SpO2: 100% 100% 100% 100%    General: A+Ox3, NAD Cardiovascular: rrr Respiratory: clear  Discharge Instructions      Discharge Orders   Future Appointments Provider Department Dept Phone   01/02/2013 11:30 AM Randall Hiss, MD [facility] for Infectious Disease [phone]   01/08/2013 11:00 AM [facility]  [facility], [place] [phone]   01/29/2013 3:00 PM Randall Hiss, MD [facility] for Infectious Disease [phone]   Future Orders Complete By Expires     Call MD for:  persistant nausea and vomiting  As directed     Call MD for:  severe uncontrolled pain  As directed     Call MD for:  temperature >100.4  As directed     Diet general  As directed      Increase activity slowly  As directed         Medication List         amLODipine 10 MG tablet  Commonly known as:  NORVASC  Take 1 tablet (10 mg total) by mouth daily.     azithromycin 600 MG tablet  Commonly known as:  ZITHROMAX  Take 2 tablets (1,200 mg total) by mouth once a week.     dolutegravir 50 MG tablet  Commonly known as:  TIVICAY  Take 1 tablet (50 mg total) by mouth daily.     emtricitabine-tenofovir 200-300 MG per tablet  Commonly known as:  TRUVADA  Take 1 tablet by mouth daily.     fluconazole 100 MG tablet  Commonly known as:  DIFLUCAN  Take 1 tablet (100 mg total) by mouth every Thursday.     hydrALAZINE 25 MG tablet  Commonly known as:  APRESOLINE  Take 1 tablet (25 mg total) by mouth every 8 (eight) hours.     megestrol 625 MG/5ML suspension  Commonly known as:  MEGACE ES  Take 5 mLs (625 mg total) by mouth daily.     nystatin 100000 UNIT/ML suspension  Commonly known as:  MYCOSTATIN  Take 5 mLs (500,000 Units total) by mouth 4 (four) times daily.     Oxycodone HCl 10 MG Tabs  Take 1 tablet (10 mg total) by mouth every 4 (four) hours as needed.     pantoprazole 20 MG tablet  Commonly known as:  PROTONIX  Take 1 tablet (20 mg total) by mouth daily.     sulfamethoxazole-trimethoprim 800-160 MG per tablet  Commonly known as:  BACTRIM DS  Take 1 tablet by mouth every Monday, Wednesday, and Friday.     traZODone 50 MG tablet  Commonly known as:  DESYREL  Take 1 tablet (50 mg total) by mouth at bedtime.     valGANciclovir 450 MG tablet  Commonly known as:  VALCYTE  Take 1 tablet (450 mg total) by mouth 2 (two) times daily. With a meal       No Known Allergies Follow-up Information   Follow up with [facility] On 01/08/2013. (11:00am, arrive at 10:30am)    Contact information:   [address]   [place] [address] [phone]       Follow up with Acey Lav, MD In 1 week.   Contact information:   [address] [place] [address] [phone]        The results of significant diagnostics from this hospitalization (including imaging, microbiology, ancillary and laboratory) are listed below for reference.    Significant Diagnostic Studies: Dg Cholangiogram Operative  12/17/2012   *RADIOLOGY REPORT*  Clinical Data: Cholelithiasis.  INTRAOPERATIVE CHOLANGIOGRAM  Technique:  Multiple fluoroscopic spot radiographs were obtained during intraoperative cholangiogram and are submitted for interpretation post-operatively.  Comparison: Ultrasound dated 11/21/2012 and ERCP dated 12/14/2012  Findings: There is marked irregularity of  the visualized bile ducts suggesting cholangitis.  There is no flow of contrast into the duodenum through the ampulla.  No visible filling defects. Contrast extravasated from the gallbladder during the exam.  IMPRESSION: Irregular bile ducts consistent with cholangitis.  No flow of contrast into the duodenum.  However, there is no visible stone in the bile duct.   Original Report Authenticated By: Francene Boyers, M.D.   US Abdomen Complete  11/21/2012   *RADIOLOGY REPORT*  Clinical Data:  Upper abdominal pain.  Question gallbladder pathology.  COMPLETE ABDOMINAL ULTRASOUND  Comparison:  Abdominal CT 11/12/2009.  Findings:  Gallbladder: There is a single 1 cm stone within the gallbladder. There is no gallbladder wall thickening or pericholecystic fluid. Sonographic Murphy's sign is absent.  Common bile duct:   Mildly dilated to 9 mm.  No intraductal calculus visualized.  Liver:  Echogenicity is within normal limits.  No focal hepatic abnormalities are identified.  However, there is mild intrahepatic biliary dilatation.  IVC:  Visualized portions appear unremarkable.  Pancreas:  The pancreatic head is prominent but partly obscured by bowel gas.  No focal mass or pancreatic ductal dilatation is evident.  There is no surrounding fluid collection.  Spleen:   Visualized portions appear unremarkable.  Right Kidney:   The renal cortical thickness and echogenicity are preserved.  There is no hydronephrosis or focal abnormality. Renal length is 11.1 cm.  Left Kidney:   The renal cortical thickness and echogenicity are preserved.  There is no hydronephrosis or focal abnormality. Renal length is 11.1 cm.  Abdominal aorta:  Visualized portions appear unremarkable.  IMPRESSION:  1.  Cholelithiasis without evidence of cholecystitis. 2.  New mild biliary dilatation without evidence of choledocholithiasis. 3.  Ill-defined enlargement of the pancreatic head likely representing pancreatitis in this patient with an elevated serum lipase level.  No focal fluid collection identified. Pancreatitis may account for the biliary dilatation.   Original Report Authenticated By: Carey Bullocks, M.D.   Dg Ercp Biliary & Pancreatic Ducts  12/14/2012   *RADIOLOGY REPORT*  Clinical Data: Common duct stones, ERCP  ERCP  Comparison: Abdominal ultrasound 11/21/2012  Findings: Five intraprocedural fluoroscopic images are provided, demonstrating no dilatation of the common or proximal intrahepatic ducts.  There is mild ductal irregularity without focal stricture or evidence for extrinsic mass effect.  No filling defect is identified.  Minimal passage of contrast into duodenum is noted. Balloon sweep was performed.  IMPRESSION: No filling defect or ductal dilatation.  However, the wall of the common duct and proximal intrahepatic ducts is irregular in appearance which raises the question of cholangitis, autoimmune disorder, or much less likely infiltrative neoplasm.  This could also be a side effect of therapy if the patient is currently being treated for the reported history of HIV, or AIDS-related cholangiopathy.   Original Report Authenticated By: Christiana Pellant, M.D.    Microbiology: Recent Results (from the past 240 hour(s))  AFB CULTURE, BLOOD     Status: None   Collection Time     12/12/12  3:48 PM      Result Value Range Status   Preliminary Report Culture will be examined for 6 weeks before    Preliminary   Preliminary Report issuing a Final Report.   Preliminary  AFB CULTURE, BLOOD     Status: None   Collection Time    12/13/12  7:15 PM      Result Value Range Status   Specimen Description BLOOD LEFT ANTECUBITAL   Final   Special  Requests AFB ONLY/5CC   Final   Culture     Final   Value: CULTURE WILL BE EXAMINED FOR 6 WEEKS BEFORE ISSUING A FINAL REPORT   Report Status PENDING   Incomplete  URINE CULTURE     Status: None   Collection Time    12/13/12  9:57 PM      Result Value Range Status   Specimen Description URINE, CATHETERIZED   Final   Special Requests NONE   Final   Culture  Setup Time 12/13/2012 22:22   Final   Colony Count NO GROWTH   Final   Culture NO GROWTH   Final   Report Status 12/14/2012 FINAL   Final     Labs: Basic Metabolic Panel:  Recent Labs Lab 12/15/12 0555 12/16/12 0518 12/17/12 0525 12/18/12 0610 12/18/12 1414  NA 143 143 139 139 136  K 3.3* 3.6 3.6 3.3* 3.3*  CL 109 110 109 107 105  CO2 27 25 22 24 24   GLUCOSE 108* 84 87 86 94  BUN 5* 6 10 8 6   CREATININE 0.84 0.93 0.86 0.96 0.94  CALCIUM 8.5 8.9 8.9 8.8 8.7   Liver Function Tests:  Recent Labs Lab 12/15/12 0555 12/16/12 0518 12/17/12 0525 12/18/12 0610 12/18/12 1414  AST 274* 135* 138* 80* 67*  ALT 223* 168* 156* 115* 107*  ALKPHOS 906* 884* 808* 710* 704*  BILITOT 2.6* 4.5* 4.6* 2.6* 1.9*  PROT 6.4 6.4 6.3 6.4 6.5  ALBUMIN 2.5* 2.5* 2.4* 2.5* 2.4*    Recent Labs Lab 12/13/12 1915 12/15/12 0555 12/16/12 0518 12/17/12 0525 12/18/12 0610  LIPASE 244* 612* 128* 133* 54   No results found for this basename: AMMONIA,  in the last 168 hours CBC:  Recent Labs Lab 12/12/12 1547 12/13/12 1915 12/14/12 0430 12/18/12 0610  WBC 2.3* 2.7* 3.4* 4.0  NEUTROABS 1.4*  --   --   --   HGB 14.5 14.3 14.0 11.7*  HCT 42.6 44.9 41.9 33.8*  MCV 90.4 95.5  94.2 91.4  PLT 215 218 198 209   Cardiac Enzymes: No results found for this basename: CKTOTAL, CKMB, CKMBINDEX, TROPONINI,  in the last 168 hours BNP: BNP (last 3 results) No results found for this basename: PROBNP,  in the last 8760 hours CBG: No results found for this basename: GLUCAP,  in the last 168 hours     Signed:  Benjamine Mola, Margrete Delude  [facility] 12/19/2012, 2:57 PM

## 2012-12-19 NOTE — Progress Notes (Signed)
I have seen and examined the patient and agree with the assessment and plans.  Deatra Mcmahen A. Delmy Holdren  MD, FACS  

## 2012-12-19 NOTE — Progress Notes (Signed)
Patient ID: Stephen Griffin, male   DOB: [birth year], 32 y.o.   MRN: [id number] 2 Days Post-Op  Subjective: Pt feels much better today.  Pain better and eating is better  Objective: Vital signs in last 24 hours: Temp:  [98.5 F (36.9 C)-99.2 F (37.3 C)] 98.6 F (37 C) (07/23 0530) Pulse Rate:  [80-93] 93 (07/23 0530) Resp:  [16-18] 18 (07/23 0530) BP: (137-156)/(90-104) 137/90 mmHg (07/23 0530) SpO2:  [100 %] 100 % (07/23 0530) Last BM Date: 12/10/12  Intake/Output from previous day: 07/22 0701 - 07/23 0700 In: 720 [P.O.:720] Out: 550 [Urine:550] Intake/Output this shift:    PE: Abd: soft, less tender, ND, +BS, incisions are c/d/i  Lab Results:   Recent Labs  12/18/12 0610  WBC 4.0  HGB 11.7*  HCT 33.8*  PLT 209   BMET  Recent Labs  12/18/12 0610 12/18/12 1414  NA 139 136  K 3.3* 3.3*  CL 107 105  CO2 24 24  GLUCOSE 86 94  BUN 8 6  CREATININE 0.96 0.94  CALCIUM 8.8 8.7   PT/INR No results found for this basename: LABPROT, INR,  in the last 72 hours CMP     Component Value Date/Time   NA 136 12/18/2012 1414   K 3.3* 12/18/2012 1414   CL 105 12/18/2012 1414   CO2 24 12/18/2012 1414   GLUCOSE 94 12/18/2012 1414   BUN 6 12/18/2012 1414   CREATININE 0.94 12/18/2012 1414   CREATININE 0.92 12/12/2012 1547   CALCIUM 8.7 12/18/2012 1414   PROT 6.5 12/18/2012 1414   ALBUMIN 2.4* 12/18/2012 1414   AST 67* 12/18/2012 1414   ALT 107* 12/18/2012 1414   ALKPHOS 704* 12/18/2012 1414   BILITOT 1.9* 12/18/2012 1414   GFRNONAA >90 12/18/2012 1414   GFRAA >90 12/18/2012 1414   Lipase     Component Value Date/Time   LIPASE 54 12/18/2012 0610       Studies/Results: Dg Cholangiogram Operative  12/17/2012   *RADIOLOGY REPORT*  Clinical Data: Cholelithiasis.  INTRAOPERATIVE CHOLANGIOGRAM  Technique:  Multiple fluoroscopic spot radiographs were obtained during intraoperative cholangiogram and are submitted for interpretation post-operatively.  Comparison: Ultrasound dated  11/21/2012 and ERCP dated 12/14/2012  Findings: There is marked irregularity of the visualized bile ducts suggesting cholangitis.  There is no flow of contrast into the duodenum through the ampulla.  No visible filling defects. Contrast extravasated from the gallbladder during the exam.  IMPRESSION: Irregular bile ducts consistent with cholangitis.  No flow of contrast into the duodenum.  However, there is no visible stone in the bile duct.   Original Report Authenticated By: Francene Boyers, M.D.    Anti-infectives: Anti-infectives   Start     Dose/Rate Route Frequency Ordered Stop   12/17/12 1400  ceFAZolin (ANCEF) IVPB 2 g/50 mL premix     2 g 100 mL/hr over 30 Minutes Intravenous  Once 12/17/12 1250 12/17/12 1310   12/14/12 1730  ciprofloxacin (CIPRO) IVPB 400 mg     400 mg 200 mL/hr over 60 Minutes Intravenous  Once 12/14/12 1719 12/14/12 1843   12/14/12 1600  ciprofloxacin (CIPRO) IVPB 400 mg     400 mg 200 mL/hr over 60 Minutes Intravenous  Once 12/14/12 1553 12/14/12 1550   12/14/12 1000  azithromycin (ZITHROMAX) tablet 1,200 mg     1,200 mg Oral Every Fri 12/13/12 1653     12/14/12 1000  sulfamethoxazole-trimethoprim (BACTRIM DS) 800-160 MG per tablet 1 tablet     1 tablet Oral  Every M-W-F 12/13/12 1726     12/14/12 0900  sulfamethoxazole-trimethoprim (BACTRIM DS) 800-160 MG per tablet 1 tablet  Status:  Discontinued     1 tablet Oral Once per day on Mon Wed Fri 12/13/12 1653 12/13/12 1709   12/13/12 1800  dolutegravir (TIVICAY) tablet 50 mg  Status:  Discontinued     50 mg Oral Daily 12/13/12 1653 12/13/12 1708   12/13/12 1800  fluconazole (DIFLUCAN) tablet 100 mg  Status:  Discontinued     100 mg Oral Daily 12/13/12 1653 12/13/12 1726   12/13/12 1800  valGANciclovir (VALCYTE) 450 MG tablet TABS 450 mg     450 mg Oral 2 times daily with meals 12/13/12 1653     12/13/12 1800  emtricitabine-tenofovir (TRUVADA) 200-300 MG per tablet 1 tablet     1 tablet Oral Daily 12/13/12 1726      12/13/12 1800  dolutegravir (TIVICAY) tablet 50 mg     50 mg Oral Daily 12/13/12 1726     12/13/12 1800  fluconazole (DIFLUCAN) tablet 100 mg     100 mg Oral Every Thu 12/13/12 1726     12/13/12 1700  emtricitabine-tenofovir (TRUVADA) 200-300 MG per tablet 1 tablet  Status:  Discontinued     1 tablet Oral Daily 12/13/12 1653 12/13/12 1708       Assessment/Plan  1. S/p lap chole 2. Cholangitis  Plan: 1. Patient feels much better today.  He is stable to be dc home from a surgical standpoint.  He will follow up in our office in 3 weeks.   LOS: 6 days    Lacoya Wilbanks E 12/19/2012, 9:15 AM Pager: [phone]

## 2012-12-19 NOTE — Progress Notes (Signed)
NUTRITION FOLLOW UP  Intervention:   1. Ensure Complete TID, 8 oz provides 350 kcal, 13 g protein  Nutrition Dx:   Increased nutrient needs related to metabolic demand as evidences by HIV and hx of weight loss, ongoing  Goal:   Patient will meet >/=90% of estimated nutrition needs.  Monitor:   PO intake, weight, labs, I/O's  Assessment:   Patient admitted with transaminitis with abdominal pain. She is s/p ERCP with sphincterotymy 7/18 and cholecystectomy 7/21. Diet advanced, patient reports she is eating "a little". Per chart, ~100% intake. She would like Ensure shakes to promote weight gain.   Height: Ht Readings from Last 1 Encounters:  12/13/12 5\' 11"  (1.803 m)    Weight Status:   Wt Readings from Last 1 Encounters:  12/13/12 124 lb 0.1 oz (56.25 kg)    Re-estimated needs:  Kcal: 1800-2050 kcal Protein: 85-105 g Fluid: >2.0 L/day  Skin: Intact  Diet Order: General   Intake/Output Summary (Last 24 hours) at 12/19/12 1118 Last data filed at 12/18/12 1700  Gross per 24 hour  Intake    360 ml  Output    300 ml  Net     60 ml    Last BM: PTA   Labs:   Recent Labs Lab 12/17/12 0525 12/18/12 0610 12/18/12 1414  NA 139 139 136  K 3.6 3.3* 3.3*  CL 109 107 105  CO2 22 24 24   BUN 10 8 6   CREATININE 0.86 0.96 0.94  CALCIUM 8.9 8.8 8.7  GLUCOSE 87 86 94    CBG (last 3)  No results found for this basename: GLUCAP,  in the last 72 hours  Scheduled Meds: . amLODipine  10 mg Oral Daily  . azithromycin  1,200 mg Oral Q Fri  . dolutegravir  50 mg Oral Daily  . emtricitabine-tenofovir  1 tablet Oral Daily  . enoxaparin (LOVENOX) injection  40 mg Subcutaneous Q24H  . fluconazole  100 mg Oral Q Thu  . hydrALAZINE  25 mg Oral Q8H  . megestrol  800 mg Oral Daily  . nicotine  21 mg Transdermal Daily  . nystatin  5 mL Oral QID  . pantoprazole  20 mg Oral Daily  . senna-docusate  1 tablet Oral BID  . sulfamethoxazole-trimethoprim  1 tablet Oral Q M,W,F  .  traZODone  50 mg Oral QHS  . valGANciclovir  450 mg Oral BID WC    Continuous Infusions: . sodium chloride 100 mL/hr at 12/13/12 1921  . lactated ringers 50 mL/hr at 12/16/12 1231  . lactated ringers 20 mL/hr at 12/17/12 1231    Linnell Fulling, RD, LDN Pager #: [phone] After-Hours Pager #: [phone]

## 2012-12-19 NOTE — Progress Notes (Addendum)
Pt given discharge instructions, and RX's  Rashema Seawright SCRN  Pt talking on phone, when attempting to give discharge instructions, first question was what was the pain medication.  Attempted to give further instructions, pt. Continued to converse on phone,  Signed form, and instructed patient to call if she had any questions, before leaving.  Tenise Stetler [employer]

## 2012-12-19 NOTE — Clinical Social Work Note (Addendum)
CSW signed off on medication assistance consult. Case Management is aware of consult. Please contact CSW with any other issues or concerns that may arise.  Darlyn Chamber, MSW, LCSWA Clinical Social Work [phone]

## 2012-12-19 NOTE — Care Management Note (Signed)
    Page 1 of 1   12/19/2012     10:48:04 AM   CARE MANAGEMENT NOTE 12/19/2012  Patient:  Stephen Griffin, Stephen Griffin   Account Number:  [id number]  Date Initiated:  12/17/2012  Documentation initiated by:  Jiles Crocker  Subjective/Objective Assessment:   ADMITTED WITH ADBOMINAL PAIN     Action/Plan:   PATIENT IS FOLLOWED BY DR VANDAMM/ INFECTIOUS DISEASE; LIVES WITH FAMILY MEMBERS   Anticipated DC Date:  12/21/2012   Anticipated DC Plan:  HOME/SELF CARE      DC Planning Services  CM consult      Choice offered to / List presented to:             Status of service:  Completed, signed off Medicare Important Message given?  NA - LOS <3 / Initial given by admissions (If response is "NO", the following Medicare IM given date fields will be blank) Date Medicare IM given:   Date Additional Medicare IM given:    Discharge Disposition:  HOME/SELF CARE  Per UR Regulation:  Reviewed for med. necessity/level of care/duration of stay  If discussed at Long Length of Stay Meetings, dates discussed:    Comments:  12/19/12 1040 Elmer Bales RN, MSN, CM-  met with patient to discuss medication accesibility.  Pt states that [facility] delivers his medications to her home. He denies any issues with her medications. Pt states that he does not have a PCP.  He was instructed to call the PCP listed on the back of his Medicaid card to establish care for follow-up.  12/17/2012- B CHANDLER RN,BSN,MHA

## 2012-12-20 LAB — HIV-1 GENOTYPR PLUS

## 2012-12-26 ENCOUNTER — Other Ambulatory Visit: Payer: Self-pay | Admitting: Infectious Disease

## 2012-12-26 DIAGNOSIS — Z113 Encounter for screening for infections with a predominantly sexual mode of transmission: Secondary | ICD-10-CM

## 2012-12-28 ENCOUNTER — Other Ambulatory Visit: Payer: Self-pay | Admitting: Internal Medicine

## 2013-01-02 ENCOUNTER — Encounter: Payer: Self-pay | Admitting: Infectious Disease

## 2013-01-02 ENCOUNTER — Ambulatory Visit (INDEPENDENT_AMBULATORY_CARE_PROVIDER_SITE_OTHER): Payer: Medicaid Other | Admitting: Infectious Disease

## 2013-01-02 VITALS — BP 119/88 | HR 97 | Temp 98.2°F | Ht 71.0 in | Wt 122.0 lb

## 2013-01-02 DIAGNOSIS — B20 Human immunodeficiency virus [HIV] disease: Secondary | ICD-10-CM

## 2013-01-02 DIAGNOSIS — Z7251 High risk heterosexual behavior: Secondary | ICD-10-CM

## 2013-01-02 DIAGNOSIS — R64 Cachexia: Secondary | ICD-10-CM

## 2013-01-02 DIAGNOSIS — K851 Biliary acute pancreatitis without necrosis or infection: Secondary | ICD-10-CM

## 2013-01-02 DIAGNOSIS — K859 Acute pancreatitis without necrosis or infection, unspecified: Secondary | ICD-10-CM

## 2013-01-02 LAB — CBC WITH DIFFERENTIAL/PLATELET
Eosinophils Absolute: 0 10*3/uL (ref 0.0–0.7)
Eosinophils Relative: 1 % (ref 0–5)
Hemoglobin: 13.4 g/dL (ref 13.0–17.0)
Lymphs Abs: 0.5 10*3/uL — ABNORMAL LOW (ref 0.7–4.0)
MCH: 30.5 pg (ref 26.0–34.0)
MCV: 90.9 fL (ref 78.0–100.0)
Monocytes Relative: 10 % (ref 3–12)
Neutrophils Relative %: 70 % (ref 43–77)
RBC: 4.4 MIL/uL (ref 4.22–5.81)

## 2013-01-02 LAB — COMPLETE METABOLIC PANEL WITH GFR
ALT: 86 U/L — ABNORMAL HIGH (ref 0–53)
AST: 79 U/L — ABNORMAL HIGH (ref 0–37)
Creat: 0.91 mg/dL (ref 0.50–1.35)
Total Bilirubin: 0.7 mg/dL (ref 0.3–1.2)

## 2013-01-02 MED ORDER — ENSURE PO LIQD: 237.0000 mL | ORAL | Status: DC

## 2013-01-02 MED ORDER — DRONABINOL 5 MG PO CAPS: 5.0000 mg | ORAL_CAPSULE | Freq: Two times a day (BID) | ORAL | Status: DC

## 2013-01-02 NOTE — Progress Notes (Signed)
  Subjective:    Patient ID: Stephen Griffin, male    DOB: December 04, 1980, 32 y.o.   MRN: [id number]  HPI  32 year old trans gender pt with HIV/AIDS noncompliant who I re-admitted to [facility] for what turned out to be GS pancreatitis and she is now sp lap cholecystectomy. She claims that she restarted her ARV Tivicay and truvada post dc from the hospital. She wants to regain weight. sHe still has pain at surgical site, seems unaware of fu with [facility]  Review of Systems  Constitutional: Negative for fever, chills, diaphoresis, activity change, appetite change, fatigue and unexpected weight change.  HENT: Negative for congestion, sore throat, rhinorrhea, sneezing, trouble swallowing and sinus pressure.   Eyes: Negative for photophobia and visual disturbance.  Respiratory: Negative for cough, chest tightness, shortness of breath, wheezing and stridor.   Cardiovascular: Negative for chest pain, palpitations and leg swelling.  Gastrointestinal: Negative for nausea, vomiting, abdominal pain, diarrhea, constipation, blood in stool, abdominal distention and anal bleeding.  Genitourinary: Negative for dysuria, hematuria, flank pain and difficulty urinating.  Musculoskeletal: Negative for myalgias, back pain, joint swelling, arthralgias and gait problem.  Skin: Negative for color change, pallor, rash and wound.  Neurological: Negative for dizziness, tremors, weakness and light-headedness.  Hematological: Negative for adenopathy. Does not bruise/bleed easily.  Psychiatric/Behavioral: Negative for behavioral problems, confusion, sleep disturbance, dysphoric mood, decreased concentration and agitation.       Objective:   Physical Exam  Constitutional: He is oriented to person, place, and time. He appears well-developed and well-nourished. No distress.  HENT:  Head: Normocephalic and atraumatic.  Mouth/Throat: Oropharynx is clear and moist. No oropharyngeal exudate.  Eyes: Conjunctivae and EOM are normal. Pupils  are equal, round, and reactive to light. No scleral icterus.  Neck: Normal range of motion. Neck supple. No JVD present.  Cardiovascular: Normal rate, regular rhythm and normal heart sounds.  Exam reveals no gallop and no friction rub.   No murmur heard. Pulmonary/Chest: Effort normal and breath sounds normal. No respiratory distress. He has no wheezes. He has no rales. He exhibits no tenderness.  Abdominal: He exhibits distension. He exhibits no mass. There is no tenderness. There is no rebound and no guarding.  Musculoskeletal: He exhibits no edema and no tenderness.  Lymphadenopathy:    He has no cervical adenopathy.  Neurological: He is alert and oriented to person, place, and time. He has normal reflexes. He exhibits normal muscle tone. Coordination normal.  Skin: Skin is warm and dry. He is not diaphoretic. No erythema. No pallor.  Psychiatric: His behavior is normal. Judgment and thought content normal.          Assessment & Plan:   HIV: continue Tivicay and Truvada, check her VL today and in 2 weeks  Gallstone pancreatitis: sp laparaoscopic cholecystectomy, needs to fu with [facility], will check labs  Weight loss; states megace not working. willl rx for marinol  High risk sexual behavior: Needs to use protection

## 2013-01-03 LAB — T-HELPER CELL (CD4) - (RCID CLINIC ONLY): CD4 T Cell Abs: 10 uL — ABNORMAL LOW (ref 400–2700)

## 2013-01-04 LAB — HIV-1 RNA ULTRAQUANT REFLEX TO GENTYP+
HIV 1 RNA Quant: 168267 copies/mL — ABNORMAL HIGH (ref <20)
HIV-1 RNA Quant, Log: 5.23 {Log} — ABNORMAL HIGH (ref <1.30)

## 2013-01-08 ENCOUNTER — Encounter (INDEPENDENT_AMBULATORY_CARE_PROVIDER_SITE_OTHER): Payer: Self-pay

## 2013-01-08 ENCOUNTER — Ambulatory Visit (INDEPENDENT_AMBULATORY_CARE_PROVIDER_SITE_OTHER): Payer: Medicaid Other | Admitting: General Surgery

## 2013-01-08 VITALS — BP 112/72 | HR 72 | Temp 98.2°F | Resp 15 | Ht 71.0 in | Wt 123.2 lb

## 2013-01-08 DIAGNOSIS — Z9889 Other specified postprocedural states: Secondary | ICD-10-CM

## 2013-01-08 DIAGNOSIS — K8010 Calculus of gallbladder with chronic cholecystitis without obstruction: Secondary | ICD-10-CM

## 2013-01-08 DIAGNOSIS — K8066 Calculus of gallbladder and bile duct with acute and chronic cholecystitis without obstruction: Secondary | ICD-10-CM

## 2013-01-08 DIAGNOSIS — Z9049 Acquired absence of other specified parts of digestive tract: Secondary | ICD-10-CM

## 2013-01-08 LAB — HIV-1 GENOTYPR PLUS

## 2013-01-08 NOTE — Progress Notes (Addendum)
Subjective: post op check     Patient ID: Stephen Griffin, male   DOB: Sep 11, 1980, 32 y.o.   MRN: [id number]  HPI Pt here for a follow up after a laparoscopic cholecystectomy 12/17/12, ERCP with sphincterectomy 12/14/12.  She has been doing fairly well, intermittent abdomina pain, taking oxycodone 1-2 times per week.  Denies fever, chills or sweats.  Resumed normal activites.  Denies diarrhea.    Review of Systems  Constitutional: Negative for fever and chills.  Respiratory: Negative for shortness of breath.   Cardiovascular: Negative for chest pain.  Gastrointestinal: Negative for abdominal pain, diarrhea, constipation, blood in stool and abdominal distention.       Objective:   Physical Exam  Constitutional: He appears well-nourished. No distress.  Pulmonary/Chest: Effort normal and breath sounds normal. He has no wheezes.  Abdominal: Soft. Bowel sounds are normal. He exhibits no distension and no mass. There is no tenderness. There is no guarding.  Healed incision sites without erythema or induration  Skin: He is not diaphoretic.       Assessment:     Chronic cholecystitis S/p lap cholecystectomy 12/17/12 and ERCP with sphincterectomy 12/14/12    Plan:     Pathology report reviewed.  Pt doing well from surgical standpoint.  May take tylenol or motrin prn for pain.  No further follow up unless new or worsening abdominal symptoms develop.

## 2013-01-08 NOTE — Patient Instructions (Signed)
You may resume to normal activities.  Follow up as needed.  Thank you for allowing [place] to be a part of your care.

## 2013-01-15 ENCOUNTER — Other Ambulatory Visit: Payer: Medicaid Other

## 2013-01-15 DIAGNOSIS — B20 Human immunodeficiency virus [HIV] disease: Secondary | ICD-10-CM

## 2013-01-15 DIAGNOSIS — Z7251 High risk heterosexual behavior: Secondary | ICD-10-CM

## 2013-01-15 DIAGNOSIS — K851 Biliary acute pancreatitis without necrosis or infection: Secondary | ICD-10-CM

## 2013-01-15 DIAGNOSIS — R64 Cachexia: Secondary | ICD-10-CM

## 2013-01-15 DIAGNOSIS — Z113 Encounter for screening for infections with a predominantly sexual mode of transmission: Secondary | ICD-10-CM

## 2013-01-16 ENCOUNTER — Other Ambulatory Visit: Payer: Medicaid Other

## 2013-01-16 LAB — CBC WITH DIFFERENTIAL/PLATELET
Eosinophils Absolute: 0.1 10*3/uL (ref 0.0–0.7)
Eosinophils Relative: 2 % (ref 0–5)
HCT: 37.7 % — ABNORMAL LOW (ref 39.0–52.0)
Hemoglobin: 12.5 g/dL — ABNORMAL LOW (ref 13.0–17.0)
Lymphs Abs: 0.9 10*3/uL (ref 0.7–4.0)
MCH: 30.3 pg (ref 26.0–34.0)
MCV: 91.5 fL (ref 78.0–100.0)
Monocytes Absolute: 0.5 10*3/uL (ref 0.1–1.0)
Monocytes Relative: 10 % (ref 3–12)
RBC: 4.12 MIL/uL — ABNORMAL LOW (ref 4.22–5.81)

## 2013-01-16 LAB — COMPLETE METABOLIC PANEL WITH GFR
ALT: 76 U/L — ABNORMAL HIGH (ref 0–53)
AST: 77 U/L — ABNORMAL HIGH (ref 0–37)
Alkaline Phosphatase: 984 U/L — ABNORMAL HIGH (ref 39–117)
Calcium: 9.2 mg/dL (ref 8.4–10.5)
Chloride: 111 mEq/L (ref 96–112)
Creat: 0.88 mg/dL (ref 0.50–1.35)
Total Bilirubin: 0.5 mg/dL (ref 0.3–1.2)

## 2013-01-16 LAB — RPR

## 2013-01-16 LAB — T-HELPER CELL (CD4) - (RCID CLINIC ONLY)
CD4 % Helper T Cell: 3 % — ABNORMAL LOW (ref 33–55)
CD4 T Cell Abs: 20 uL — ABNORMAL LOW (ref 400–2700)

## 2013-01-26 LAB — AFB CULTURE, BLOOD

## 2013-01-29 ENCOUNTER — Ambulatory Visit: Payer: Medicaid Other | Admitting: Infectious Disease

## 2013-02-18 ENCOUNTER — Ambulatory Visit: Payer: Medicaid Other | Admitting: Infectious Disease

## 2013-04-04 ENCOUNTER — Other Ambulatory Visit: Payer: Self-pay

## 2013-04-22 ENCOUNTER — Other Ambulatory Visit: Payer: Self-pay | Admitting: Infectious Diseases

## 2013-04-22 DIAGNOSIS — B20 Human immunodeficiency virus [HIV] disease: Secondary | ICD-10-CM

## 2013-04-29 ENCOUNTER — Other Ambulatory Visit: Payer: Self-pay | Admitting: Infectious Diseases

## 2013-04-29 ENCOUNTER — Other Ambulatory Visit: Payer: Self-pay | Admitting: Internal Medicine

## 2013-05-08 ENCOUNTER — Ambulatory Visit (INDEPENDENT_AMBULATORY_CARE_PROVIDER_SITE_OTHER): Payer: Medicaid Other | Admitting: Infectious Disease

## 2013-05-08 ENCOUNTER — Encounter: Payer: Self-pay | Admitting: Infectious Disease

## 2013-05-08 ENCOUNTER — Other Ambulatory Visit (HOSPITAL_COMMUNITY)
Admission: RE | Admit: 2013-05-08 | Discharge: 2013-05-08 | Disposition: A | Discharge status: Home / Self Care | Payer: Medicaid Other | Source: Ambulatory Visit | Attending: Infectious Disease | Admitting: Infectious Disease

## 2013-05-08 VITALS — BP 145/90 | HR 98 | Temp 98.1°F | Wt 128.5 lb

## 2013-05-08 DIAGNOSIS — L678 Other hair color and hair shaft abnormalities: Secondary | ICD-10-CM

## 2013-05-08 DIAGNOSIS — Z113 Encounter for screening for infections with a predominantly sexual mode of transmission: Secondary | ICD-10-CM | POA: Insufficient documentation

## 2013-05-08 DIAGNOSIS — Z9119 Patient's noncompliance with other medical treatment and regimen: Secondary | ICD-10-CM

## 2013-05-08 DIAGNOSIS — B20 Human immunodeficiency virus [HIV] disease: Secondary | ICD-10-CM

## 2013-05-08 DIAGNOSIS — Z23 Encounter for immunization: Secondary | ICD-10-CM

## 2013-05-08 DIAGNOSIS — L738 Other specified follicular disorders: Secondary | ICD-10-CM

## 2013-05-08 DIAGNOSIS — I10 Essential (primary) hypertension: Secondary | ICD-10-CM

## 2013-05-08 DIAGNOSIS — L739 Follicular disorder, unspecified: Secondary | ICD-10-CM

## 2013-05-08 LAB — CBC WITH DIFFERENTIAL/PLATELET
Basophils Absolute: 0 10*3/uL (ref 0.0–0.1)
Basophils Relative: 1 % (ref 0–1)
HCT: 47 % (ref 39.0–52.0)
Hemoglobin: 16 g/dL (ref 13.0–17.0)
MCH: 30 pg (ref 26.0–34.0)
MCHC: 34 g/dL (ref 30.0–36.0)
Neutro Abs: 1.3 10*3/uL — ABNORMAL LOW (ref 1.7–7.7)
Neutrophils Relative %: 60 % (ref 43–77)
RDW: 13.2 % (ref 11.5–15.5)
WBC: 2.1 10*3/uL — ABNORMAL LOW (ref 4.0–10.5)

## 2013-05-08 LAB — COMPLETE METABOLIC PANEL WITH GFR
AST: 80 U/L — ABNORMAL HIGH (ref 0–37)
Albumin: 3.9 g/dL (ref 3.5–5.2)
Alkaline Phosphatase: 1038 U/L — ABNORMAL HIGH (ref 39–117)
Chloride: 105 mEq/L (ref 96–112)
Potassium: 4.1 mEq/L (ref 3.5–5.3)
Sodium: 140 mEq/L (ref 135–145)
Total Protein: 8.7 g/dL — ABNORMAL HIGH (ref 6.0–8.3)

## 2013-05-08 MED ORDER — TRIAMCINOLONE ACETONIDE 0.5 % EX OINT: 1.0000 "application " | TOPICAL_OINTMENT | Freq: Two times a day (BID) | CUTANEOUS | Status: DC

## 2013-05-08 NOTE — Progress Notes (Signed)
Subjective:    Patient ID: Stephen Griffin, male    DOB: 1980/08/02, 32 y.o.   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 She claims that she restarted her ARV Tivicay and truvada post dc from the hospital. Claims that she has been adherent with his regimen since August. We did actually see her viral load dropped a properly within the 10-14 days and would recheck viral loads on her and came down from several 100,000 copies to roughly 20 some thousand copies. If she really has been compliant she should close the undetectable by now and her CD4 count should be raised. She does not seem very in tune with her other medications including Bactrim for PCP prophylaxis or azithromycin for Mycobacterium avium prophylaxis.  She states that she is having some pruritus on her arms bilaterally and asked for something for this.  Review of Systems  Constitutional: Negative for chills, diaphoresis, activity change, appetite change, fatigue and unexpected weight change.  HENT: Negative for congestion, rhinorrhea, sinus pressure, sneezing, sore throat and trouble swallowing.   Eyes: Negative for photophobia and visual disturbance.  Respiratory: Negative for cough, chest tightness, shortness of breath, wheezing and stridor.   Cardiovascular: Negative for chest pain, palpitations and leg swelling.  Gastrointestinal: Negative for blood in stool, abdominal distention and anal bleeding.  Genitourinary: Negative for flank pain and difficulty urinating.  Musculoskeletal: Negative for back pain, gait problem and joint swelling.  Skin: Positive for rash. Negative for color change, pallor and wound.  Neurological: Negative for dizziness, tremors, weakness and light-headedness.  Hematological: Negative for adenopathy. Does not bruise/bleed easily.  Psychiatric/Behavioral: Negative for  behavioral problems, confusion, sleep disturbance, dysphoric mood, decreased concentration and agitation.       Objective:   Physical Exam  Constitutional: He is oriented to person, place, and time. He appears well-developed and well-nourished. No distress.  HENT:  Head: Normocephalic and atraumatic.  Mouth/Throat: Oropharynx is clear and moist. No oropharyngeal exudate.  Eyes: Conjunctivae and EOM are normal. Pupils are equal, round, and reactive to light. No scleral icterus.  Neck: Normal range of motion. Neck supple. No JVD present.  Cardiovascular: Normal rate, regular rhythm and normal heart sounds.  Exam reveals no gallop and no friction rub.   No murmur heard. Pulmonary/Chest: Effort normal and breath sounds normal. No respiratory distress. He has no wheezes. He has no rales. He exhibits no tenderness.  Abdominal: He exhibits no distension and no mass. There is no tenderness. There is no rebound and no guarding.  Musculoskeletal: He exhibits no edema and no tenderness.  Lymphadenopathy:    He has no cervical adenopathy.  Neurological: He is alert and oriented to person, place, and time. He has normal reflexes. He exhibits normal muscle tone. Coordination normal.  Skin: Skin is warm and dry. He is not diaphoretic. No erythema. No pallor.     Psychiatric: His behavior is normal. Judgment and thought content normal.          Assessment & Plan:   HIV: continue Tivicay and Truvada, check her VL today and in and bring back next week. I spent greater than 20 minutes with the patient including greater than 50% of time in face to face counsel of the patient and in coordination of their care.  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  Need for influenza vaccination vaccine flu vaccine given

## 2013-05-09 LAB — MICROALBUMIN / CREATININE URINE RATIO
Creatinine, Urine: 205.8 mg/dL
Microalb Creat Ratio: 47.2 mg/g — ABNORMAL HIGH (ref 0.0–30.0)

## 2013-05-09 LAB — HEPATITIS C RNA QUANTITATIVE: HCV Quantitative: NOT DETECTED IU/mL (ref <15)

## 2013-05-09 LAB — HIV-1 RNA ULTRAQUANT REFLEX TO GENTYP+: HIV 1 RNA Quant: 30074 copies/mL — ABNORMAL HIGH (ref <20)

## 2013-05-09 LAB — RPR

## 2013-05-10 LAB — T-HELPER CELL (CD4) - (RCID CLINIC ONLY): CD4 T Cell Abs: <10 /uL — ABNORMAL LOW (ref 400–2700)

## 2013-05-15 LAB — HIV-1 GENOTYPR PLUS

## 2013-05-17 ENCOUNTER — Other Ambulatory Visit: Payer: Self-pay

## 2013-05-17 DIAGNOSIS — B20 Human immunodeficiency virus [HIV] disease: Secondary | ICD-10-CM

## 2013-05-17 MED ORDER — TRIAMCINOLONE ACETONIDE 0.5 % EX OINT: 1.0000 "application " | TOPICAL_OINTMENT | Freq: Two times a day (BID) | CUTANEOUS | Status: DC

## 2013-05-17 MED ORDER — TRAMADOL HCL 50 MG PO TABS: ORAL_TABLET | ORAL | Status: DC

## 2013-05-17 NOTE — Telephone Encounter (Signed)
Medication refills from previous visit .  Tramadol was sent electronically and will need to be phoned to pharmacy.  Tramadol was called.   Laurell Josephs, RN

## 2013-05-18 LAB — HIV-1 INTEGRASE GENOTYPE

## 2013-06-17 ENCOUNTER — Telehealth: Payer: Self-pay | Admitting: Infectious Disease

## 2013-06-17 NOTE — Telephone Encounter (Signed)
Chocolate needs ADAP , Ryan white renewal. VERY< VERY difficult case as we well know, Needs MD appt can do labs same day

## 2013-06-27 ENCOUNTER — Other Ambulatory Visit: Payer: Self-pay | Admitting: Licensed Clinical Social Worker

## 2013-06-27 NOTE — Telephone Encounter (Signed)
error 

## 2013-07-17 ENCOUNTER — Other Ambulatory Visit (HOSPITAL_COMMUNITY)
Admission: RE | Admit: 2013-07-17 | Discharge: 2013-07-17 | Disposition: A | Discharge status: Home / Self Care | Payer: Medicaid Other | Source: Ambulatory Visit | Attending: Infectious Disease | Admitting: Infectious Disease

## 2013-07-17 ENCOUNTER — Encounter: Payer: Self-pay | Admitting: Infectious Disease

## 2013-07-17 ENCOUNTER — Ambulatory Visit (INDEPENDENT_AMBULATORY_CARE_PROVIDER_SITE_OTHER): Payer: Medicaid Other | Admitting: Infectious Disease

## 2013-07-17 ENCOUNTER — Other Ambulatory Visit: Payer: Self-pay | Admitting: Infectious Disease

## 2013-07-17 ENCOUNTER — Other Ambulatory Visit: Payer: Self-pay | Admitting: Internal Medicine

## 2013-07-17 VITALS — BP 126/81 | HR 96 | Temp 98.1°F | Wt 129.0 lb

## 2013-07-17 DIAGNOSIS — Z113 Encounter for screening for infections with a predominantly sexual mode of transmission: Secondary | ICD-10-CM | POA: Insufficient documentation

## 2013-07-17 DIAGNOSIS — F640 Transsexualism: Secondary | ICD-10-CM

## 2013-07-17 DIAGNOSIS — B20 Human immunodeficiency virus [HIV] disease: Secondary | ICD-10-CM

## 2013-07-17 DIAGNOSIS — I10 Essential (primary) hypertension: Secondary | ICD-10-CM

## 2013-07-17 DIAGNOSIS — Z789 Other specified health status: Secondary | ICD-10-CM

## 2013-07-17 LAB — CBC WITH DIFFERENTIAL/PLATELET
Basophils Absolute: 0.1 10*3/uL (ref 0.0–0.1)
Basophils Relative: 1 % (ref 0–1)
EOS ABS: 0.7 10*3/uL (ref 0.0–0.7)
Eosinophils Relative: 11 % — ABNORMAL HIGH (ref 0–5)
HCT: 37.8 % — ABNORMAL LOW (ref 39.0–52.0)
HEMOGLOBIN: 13 g/dL (ref 13.0–17.0)
LYMPHS ABS: 1.5 10*3/uL (ref 0.7–4.0)
Lymphocytes Relative: 25 % (ref 12–46)
MCH: 30.5 pg (ref 26.0–34.0)
MCHC: 34.4 g/dL (ref 30.0–36.0)
MCV: 88.7 fL (ref 78.0–100.0)
MONOS PCT: 9 % (ref 3–12)
Monocytes Absolute: 0.5 10*3/uL (ref 0.1–1.0)
Neutro Abs: 3.3 10*3/uL (ref 1.7–7.7)
Neutrophils Relative %: 54 % (ref 43–77)
Platelets: 352 10*3/uL (ref 150–400)
RBC: 4.26 MIL/uL (ref 4.22–5.81)
RDW: 13.4 % (ref 11.5–15.5)
WBC: 6.1 10*3/uL (ref 4.0–10.5)

## 2013-07-17 LAB — COMPLETE METABOLIC PANEL WITH GFR
ALT: 52 U/L (ref 0–53)
AST: 42 U/L — AB (ref 0–37)
Albumin: 4.1 g/dL (ref 3.5–5.2)
Alkaline Phosphatase: 195 U/L — ABNORMAL HIGH (ref 39–117)
BUN: 11 mg/dL (ref 6–23)
CHLORIDE: 112 meq/L (ref 96–112)
CO2: 27 mEq/L (ref 19–32)
CREATININE: 1.07 mg/dL (ref 0.50–1.35)
Calcium: 9.3 mg/dL (ref 8.4–10.5)
GFR, Est African American: >89 mL/min
GFR, Est Non African American: >89 mL/min
Glucose, Bld: 85 mg/dL (ref 70–99)
Potassium: 3.9 mEq/L (ref 3.5–5.3)
SODIUM: 145 meq/L (ref 135–145)
TOTAL PROTEIN: 7 g/dL (ref 6.0–8.3)
Total Bilirubin: 0.2 mg/dL (ref 0.2–1.2)

## 2013-07-17 LAB — RPR

## 2013-07-17 NOTE — Progress Notes (Signed)
Subjective:    Patient ID: Stephen Griffin, male    DOB: 06-08-1980, 33 y.o.   MRN: [id number]  HPI   33 year old trans gender pt with HIV/AIDS generally noncompliant and with last CD4 count <10  She states that she has been taking her ARVS every day religiously including when she was in jail for past 30 days ever since she was last seen in clinic and had VL of 30k.  She is used [place]. SHe als states she is taking her prophlyactic bactrim and azithromycin  She would like to ask for hormones to treat her trans-gender identity.  She does NOT have hx of CAD nor hx of first degree relatives with this condition. She does not had DM. She does smoke but down to 1-2 cigarettes per day.     Review of Systems  Constitutional: Negative for chills, diaphoresis, activity change, appetite change, fatigue and unexpected weight change.  HENT: Negative for congestion, rhinorrhea, sinus pressure, sneezing, sore throat and trouble swallowing.   Eyes: Negative for photophobia and visual disturbance.  Respiratory: Negative for cough, chest tightness, shortness of breath, wheezing and stridor.   Cardiovascular: Negative for chest pain, palpitations and leg swelling.  Gastrointestinal: Negative for blood in stool, abdominal distention and anal bleeding.  Genitourinary: Negative for flank pain and difficulty urinating.  Musculoskeletal: Negative for back pain, gait problem and joint swelling.  Skin: Negative for color change, pallor and wound.  Neurological: Negative for dizziness, tremors, weakness and light-headedness.  Hematological: Negative for adenopathy. Does not bruise/bleed easily.  Psychiatric/Behavioral: Negative for behavioral problems, confusion, sleep disturbance, dysphoric mood, decreased concentration and agitation.       Objective:   Physical Exam  Constitutional: He is oriented to person, place, and time. He appears well-developed and well-nourished. No distress.   HENT:  Head: Normocephalic and atraumatic.  Mouth/Throat: Oropharynx is clear and moist. No oropharyngeal exudate.  Eyes: Conjunctivae and EOM are normal. No scleral icterus.  Neck: Normal range of motion. Neck supple. No JVD present.  Cardiovascular: Normal rate, regular rhythm and normal heart sounds.  Exam reveals no gallop and no friction rub.   No murmur heard. Pulmonary/Chest: Effort normal and breath sounds normal. No respiratory distress. He has no wheezes. He has no rales.  Abdominal: He exhibits no distension. There is no tenderness. There is no rebound.  Musculoskeletal: He exhibits no edema and no tenderness.  Lymphadenopathy:    He has no cervical adenopathy.  Neurological: He is alert and oriented to person, place, and time. He exhibits normal muscle tone. Coordination normal.  Skin: Skin is warm and dry. He is not diaphoretic. No erythema. No pallor.  Psychiatric: He has a normal mood and affect. His behavior is normal. Judgment and thought content normal.          Assessment & Plan:   HIV: continue Tivicay and Truvada, check her VL today and in and bring back 2 weeks, then recheck VL and CD4 count in 4 weeks  I spent greater than 20 minutes with the patient including greater than 50% of time in face to face counsel of the patient and in coordination of their care.  She needs to be on bactrim and azithro as well  High risk sexual behavior: Needs to use protection, condoms given  Transgender: I am willing to switcher her amlodipine for ALDACTONE and start male hormones if she will work hard to stop smoking altogether and if she is adherent to her ARVs.  HTN: see above, will likely switch to aldactone for anti-androgen effects

## 2013-07-18 LAB — T-HELPER CELL (CD4) - (RCID CLINIC ONLY)
CD4 T CELL ABS: 130 /uL — AB (ref 400–2700)
CD4 T CELL HELPER: 8 % — AB (ref 33–55)

## 2013-07-18 LAB — HIV-1 RNA ULTRAQUANT REFLEX TO GENTYP+
HIV 1 RNA QUANT: 201 {copies}/mL — AB (ref <20)
HIV-1 RNA Quant, Log: 2.3 {Log} — ABNORMAL HIGH (ref <1.30)

## 2013-07-18 LAB — URINE CYTOLOGY ANCILLARY ONLY
Chlamydia: NEGATIVE
Neisseria Gonorrhea: NEGATIVE

## 2013-07-24 LAB — HIV-1 INTEGRASE GENOTYPE

## 2013-07-31 ENCOUNTER — Ambulatory Visit: Payer: Medicaid Other | Admitting: Infectious Disease

## 2013-08-01 ENCOUNTER — Encounter: Payer: Self-pay | Admitting: Infectious Disease

## 2013-08-01 ENCOUNTER — Ambulatory Visit (INDEPENDENT_AMBULATORY_CARE_PROVIDER_SITE_OTHER): Payer: Medicaid Other | Admitting: Infectious Disease

## 2013-08-01 VITALS — BP 128/82 | HR 102 | Temp 98.2°F | Wt 135.0 lb

## 2013-08-01 DIAGNOSIS — F640 Transsexualism: Secondary | ICD-10-CM

## 2013-08-01 DIAGNOSIS — I10 Essential (primary) hypertension: Secondary | ICD-10-CM

## 2013-08-01 DIAGNOSIS — Z789 Other specified health status: Secondary | ICD-10-CM

## 2013-08-01 DIAGNOSIS — B20 Human immunodeficiency virus [HIV] disease: Secondary | ICD-10-CM

## 2013-08-01 MED ORDER — ESTRADIOL VALERATE 10 MG/ML IM OIL: 5.0000 mg | TOPICAL_OIL | INTRAMUSCULAR | Status: DC

## 2013-08-01 MED ORDER — SPIRONOLACTONE 50 MG PO TABS
50.0000 mg | ORAL_TABLET | Freq: Every day | ORAL | Status: DC
Start: 2013-08-01 — End: 2013-10-03

## 2013-08-01 NOTE — Patient Instructions (Signed)
We are STOPPING THE AMLODIPINE (NORVASC)  We are starting ALDACTONE (spironolactone) 50mg   We need you to come back for chemistry check next week  We will check your viral load and T cell count today  We will start IM estradiol at one half a ml every two weeks in the muscle  RTC in 3 weeks

## 2013-08-01 NOTE — Progress Notes (Signed)
  Subjective:    Patient ID: AH BOTT, male    DOB: [birth year], 33 y.o.   MRN: [id number]  HPI   33 year old trans gender pt with HIV/AIDS generally noncompliant and with  CD4 count <10, now is being compliant with her VL down to 221 and CD4 up to 170 on Tivicay and Truvada.  Stephen Griffin als states she is taking her prophlyactic bactrim and azithromycin  She wishes for hormones to treat her trans-gender identity.  She does NOT have hx of CAD nor hx of first degree relatives with this condition. She does not had DM. She does smoke but down to 1-2 cigarettes per day.       Review of Systems  Constitutional: Negative for chills, diaphoresis, activity change, appetite change, fatigue and unexpected weight change.  HENT: Negative for congestion, rhinorrhea, sinus pressure, sneezing, sore throat and trouble swallowing.   Eyes: Negative for photophobia and visual disturbance.  Respiratory: Negative for cough, chest tightness, shortness of breath, wheezing and stridor.   Cardiovascular: Negative for chest pain, palpitations and leg swelling.  Gastrointestinal: Negative for blood in stool, abdominal distention and anal bleeding.  Genitourinary: Negative for flank pain and difficulty urinating.  Musculoskeletal: Negative for back pain, gait problem and joint swelling.  Skin: Negative for color change, pallor and wound.  Neurological: Negative for dizziness, tremors, weakness and light-headedness.  Hematological: Negative for adenopathy. Does not bruise/bleed easily.  Psychiatric/Behavioral: Negative for behavioral problems, confusion, sleep disturbance, dysphoric mood, decreased concentration and agitation.       Objective:   Physical Exam  Constitutional: He is oriented to person, place, and time. He appears well-developed and well-nourished. No distress.  HENT:  Head: Normocephalic and atraumatic.  Mouth/Throat: Oropharynx is clear and moist. No oropharyngeal exudate.  Eyes:  Conjunctivae and EOM are normal. No scleral icterus.  Neck: Normal range of motion. Neck supple. No JVD present.  Cardiovascular: Normal rate, regular rhythm and normal heart sounds.  Exam reveals no gallop and no friction rub.   No murmur heard. Pulmonary/Chest: Effort normal and breath sounds normal. No respiratory distress. He has no wheezes. He has no rales.  Abdominal: He exhibits no distension. There is no tenderness. There is no rebound.  Musculoskeletal: He exhibits no edema and no tenderness.  Lymphadenopathy:    He has no cervical adenopathy.  Neurological: He is alert and oriented to person, place, and time. He exhibits normal muscle tone. Coordination normal.  Skin: Skin is warm and dry. He is not diaphoretic. No erythema. No pallor.  Psychiatric: He has a normal mood and affect. His behavior is normal. Judgment and thought content normal.          Assessment & Plan:   HIV: continue Tivicay and Truvada, check her VL today and CD4 today   She needs to be on bactrim and azithro as well for now  I am SO PROUD of her!  High risk sexual behavior: Needs to use protection, condoms given  Transgender: -- DC amlodipine   -- start ALDACTONE 50mg  law panel next week  --Start estradiol 5 mg every 2 weeks intramuscular will show her how to do this next week with a nursing visit   I spent greater than 25  minutes with the patient including greater than 50% of time in face to face counsel of the patient and in coordination of their care.    HTN: see above, switch to aldactone for anti-androgen effects

## 2013-08-02 LAB — T-HELPER CELL (CD4) - (RCID CLINIC ONLY)
CD4 % Helper T Cell: 8 % — ABNORMAL LOW (ref 33–55)
CD4 T CELL ABS: 120 /uL — AB (ref 400–2700)

## 2013-08-04 LAB — HIV-1 RNA QUANT-NO REFLEX-BLD
HIV 1 RNA QUANT: 99 {copies}/mL — AB (ref <20)
HIV-1 RNA Quant, Log: 2 {Log} — ABNORMAL HIGH (ref <1.30)

## 2013-08-09 ENCOUNTER — Ambulatory Visit: Payer: Medicaid Other

## 2013-08-12 ENCOUNTER — Ambulatory Visit (INDEPENDENT_AMBULATORY_CARE_PROVIDER_SITE_OTHER): Payer: Medicaid Other | Admitting: *Deleted

## 2013-08-12 ENCOUNTER — Other Ambulatory Visit: Payer: Self-pay | Admitting: Infectious Disease

## 2013-08-12 VITALS — Wt 138.0 lb

## 2013-08-12 DIAGNOSIS — F640 Transsexualism: Secondary | ICD-10-CM

## 2013-08-12 DIAGNOSIS — G4700 Insomnia, unspecified: Secondary | ICD-10-CM

## 2013-08-12 DIAGNOSIS — L739 Follicular disorder, unspecified: Secondary | ICD-10-CM

## 2013-08-12 DIAGNOSIS — R636 Underweight: Secondary | ICD-10-CM

## 2013-08-12 MED ORDER — ESTRADIOL VALERATE 20 MG/ML IM OIL
5.0000 mg | TOPICAL_OIL | Freq: Once | INTRAMUSCULAR | Status: AC
  Administered 2013-08-12: 5 mg via INTRAMUSCULAR

## 2013-08-12 NOTE — Progress Notes (Signed)
Pt brought in her own estrogen injectable.  RN provided needle and education about home injections.  Patient will return in 2 weeks for additional teaching.

## 2013-08-14 ENCOUNTER — Other Ambulatory Visit: Payer: Medicaid Other

## 2013-08-15 ENCOUNTER — Other Ambulatory Visit: Payer: Medicaid Other

## 2013-08-15 DIAGNOSIS — B20 Human immunodeficiency virus [HIV] disease: Secondary | ICD-10-CM

## 2013-08-15 LAB — CBC WITH DIFFERENTIAL/PLATELET
BASOS PCT: 1 % (ref 0–1)
Basophils Absolute: 0.1 10*3/uL (ref 0.0–0.1)
EOS ABS: 0.3 10*3/uL (ref 0.0–0.7)
Eosinophils Relative: 5 % (ref 0–5)
HCT: 41.4 % (ref 39.0–52.0)
HEMOGLOBIN: 13.9 g/dL (ref 13.0–17.0)
Lymphocytes Relative: 30 % (ref 12–46)
Lymphs Abs: 1.9 10*3/uL (ref 0.7–4.0)
MCH: 32.2 pg (ref 26.0–34.0)
MCHC: 33.6 g/dL (ref 30.0–36.0)
MCV: 95.8 fL (ref 78.0–100.0)
MONOS PCT: 9 % (ref 3–12)
Monocytes Absolute: 0.6 10*3/uL (ref 0.1–1.0)
NEUTROS PCT: 55 % (ref 43–77)
Neutro Abs: 3.5 10*3/uL (ref 1.7–7.7)
Platelets: 357 10*3/uL (ref 150–400)
RBC: 4.32 MIL/uL (ref 4.22–5.81)
RDW: 14.8 % (ref 11.5–15.5)
WBC: 6.3 10*3/uL (ref 4.0–10.5)

## 2013-08-15 LAB — COMPLETE METABOLIC PANEL WITH GFR
ALK PHOS: 151 U/L — AB (ref 39–117)
ALT: 24 U/L (ref 0–53)
AST: 32 U/L (ref 0–37)
Albumin: 4 g/dL (ref 3.5–5.2)
BILIRUBIN TOTAL: 0.3 mg/dL (ref 0.2–1.2)
BUN: 11 mg/dL (ref 6–23)
CO2: 27 mEq/L (ref 19–32)
Calcium: 9.3 mg/dL (ref 8.4–10.5)
Chloride: 105 mEq/L (ref 96–112)
Creat: 0.93 mg/dL (ref 0.50–1.35)
GFR, Est African American: >89 mL/min
Glucose, Bld: 83 mg/dL (ref 70–99)
Potassium: 4.3 mEq/L (ref 3.5–5.3)
SODIUM: 137 meq/L (ref 135–145)
TOTAL PROTEIN: 7.5 g/dL (ref 6.0–8.3)

## 2013-08-16 ENCOUNTER — Other Ambulatory Visit: Payer: Self-pay | Admitting: Infectious Disease

## 2013-08-16 ENCOUNTER — Other Ambulatory Visit: Payer: Self-pay | Admitting: Internal Medicine

## 2013-08-16 LAB — HIV-1 RNA ULTRAQUANT REFLEX TO GENTYP+
HIV 1 RNA QUANT: 195 {copies}/mL — AB (ref <20)
HIV-1 RNA Quant, Log: 2.29 {Log} — ABNORMAL HIGH (ref <1.30)

## 2013-08-16 LAB — T-HELPER CELL (CD4) - (RCID CLINIC ONLY)
CD4 % Helper T Cell: 7 % — ABNORMAL LOW (ref 33–55)
CD4 T Cell Abs: 160 /uL — ABNORMAL LOW (ref 400–2700)

## 2013-08-21 ENCOUNTER — Encounter: Payer: Self-pay | Admitting: Infectious Disease

## 2013-08-21 ENCOUNTER — Ambulatory Visit (INDEPENDENT_AMBULATORY_CARE_PROVIDER_SITE_OTHER): Payer: Medicaid Other | Admitting: Infectious Disease

## 2013-08-21 VITALS — BP 119/79 | HR 90 | Temp 97.7°F | Wt 140.0 lb

## 2013-08-21 DIAGNOSIS — F640 Transsexualism: Secondary | ICD-10-CM

## 2013-08-21 DIAGNOSIS — B20 Human immunodeficiency virus [HIV] disease: Secondary | ICD-10-CM

## 2013-08-21 DIAGNOSIS — I10 Essential (primary) hypertension: Secondary | ICD-10-CM

## 2013-08-21 DIAGNOSIS — Z789 Other specified health status: Secondary | ICD-10-CM

## 2013-08-21 MED ORDER — "SYRINGE 18G X 1-1/2"" 3 ML MISC": 1.0000 | Status: DC

## 2013-08-21 MED ORDER — ESTRADIOL VALERATE 10 MG/ML IM OIL: 10.0000 mg | TOPICAL_OIL | INTRAMUSCULAR | Status: DC

## 2013-08-21 NOTE — Progress Notes (Signed)
  Subjective:    Patient ID: Stephen Griffin, male    DOB: 1980-12-09, 33 y.o.   MRN: [id number]  HPI   33 year old trans gender pt with HIV/AIDS generally noncompliant and with  CD4 count <10, now is being compliant with her VL down below 200 though not perfectly suppressed or undetectable. CD4 is now approaching 200  We have also switched her to aldactone for rx of his transgender issues, and BP. She is also on low dose estrogen that we can escalate to 10mg .      Review of Systems  Constitutional: Negative for chills, diaphoresis, activity change, appetite change, fatigue and unexpected weight change.  HENT: Negative for congestion, rhinorrhea, sinus pressure, sneezing, sore throat and trouble swallowing.   Eyes: Negative for photophobia and visual disturbance.  Respiratory: Negative for cough, chest tightness, shortness of breath, wheezing and stridor.   Cardiovascular: Negative for chest pain, palpitations and leg swelling.  Gastrointestinal: Negative for blood in stool, abdominal distention and anal bleeding.  Genitourinary: Negative for flank pain and difficulty urinating.  Musculoskeletal: Negative for back pain, gait problem and joint swelling.  Skin: Negative for color change, pallor and wound.  Neurological: Negative for dizziness, tremors, weakness and light-headedness.  Hematological: Negative for adenopathy. Does not bruise/bleed easily.  Psychiatric/Behavioral: Negative for behavioral problems, confusion, sleep disturbance, dysphoric mood, decreased concentration and agitation.       Objective:   Physical Exam  Constitutional: He is oriented to person, place, and time. He appears well-developed and well-nourished. No distress.  HENT:  Head: Normocephalic and atraumatic.  Mouth/Throat: Oropharynx is clear and moist. No oropharyngeal exudate.  Eyes: Conjunctivae and EOM are normal. No scleral icterus.  Neck: Normal range of motion. Neck supple. No JVD present.   Cardiovascular: Normal rate, regular rhythm and normal heart sounds.  Exam reveals no gallop and no friction rub.   No murmur heard. Pulmonary/Chest: Effort normal and breath sounds normal. No respiratory distress. He has no wheezes. He has no rales.  Abdominal: He exhibits no distension. There is no tenderness. There is no rebound.  Musculoskeletal: He exhibits no edema and no tenderness.  Lymphadenopathy:    He has no cervical adenopathy.  Neurological: He is alert and oriented to person, place, and time. He exhibits normal muscle tone. Coordination normal.  Skin: Skin is warm and dry. He is not diaphoretic. No erythema. No pallor.  Psychiatric: He has a normal mood and affect. His behavior is normal. Judgment and thought content normal.          Assessment & Plan:   HIV: continue Tivicay and Truvada, check her VL today and CD4 today   She needs to be on bactrim and azithro as well for now  I REMAIN very PROUD of her!  High risk sexual behavior: Needs to use protection, condoms given  --check VL and CD4 at return visit in one month  Transgender:   -- continue  ALDACTONE 50mg  law panel next week  --increase estradiol to 10 mg every 2 weeks intramuscular will show her how to do this next week with a nursing visit   I spent greater than 25  minutes with the patient including greater than 50% of time in face to face counsel of the patient and in coordination of their care.    HTN: well controlled on aldactone alone

## 2013-08-26 ENCOUNTER — Ambulatory Visit: Payer: Medicaid Other

## 2013-08-26 ENCOUNTER — Other Ambulatory Visit: Payer: Self-pay | Admitting: *Deleted

## 2013-08-26 DIAGNOSIS — Z789 Other specified health status: Secondary | ICD-10-CM

## 2013-08-26 DIAGNOSIS — F640 Transsexualism: Secondary | ICD-10-CM

## 2013-08-27 ENCOUNTER — Ambulatory Visit: Payer: Medicaid Other | Admitting: *Deleted

## 2013-08-27 DIAGNOSIS — Z7189 Other specified counseling: Secondary | ICD-10-CM

## 2013-08-27 NOTE — Progress Notes (Signed)
Patient brought in her own medication for observation of injection.  RN educated patient on how to draw up medication and proper injection.  RN observed patient in all steps.  Patient has her own supplies on order from [facility], states she feels confident in her ability to inject her medication every 14 days.   Landis Gandy, RN

## 2013-08-28 ENCOUNTER — Telehealth: Payer: Self-pay | Admitting: *Deleted

## 2013-08-28 NOTE — Telephone Encounter (Signed)
Patient called to speak with Clifton Custard. She advised she received her needles but no syringes. She advised she was to get both so she could give herself estrogen injections. Advised her that Sharyn Lull is with patients at this time but that we will get together and work on this and give her a call back asap.

## 2013-08-29 NOTE — Telephone Encounter (Signed)
Per pharmacy, needles were sent with syringes.  RN attempted to call patient for clarification, her phone is not accepting calls at this time.  Patient needs to contact the pharmacy. Landis Gandy, RN

## 2013-09-13 ENCOUNTER — Other Ambulatory Visit: Payer: Self-pay | Admitting: *Deleted

## 2013-09-13 MED ORDER — MEGESTROL ACETATE 625 MG/5ML PO SUSP: 625.0000 mg | Freq: Every day | ORAL | Status: DC

## 2013-10-03 ENCOUNTER — Ambulatory Visit (INDEPENDENT_AMBULATORY_CARE_PROVIDER_SITE_OTHER): Payer: Medicaid Other | Admitting: Infectious Disease

## 2013-10-03 ENCOUNTER — Encounter: Payer: Self-pay | Admitting: Infectious Disease

## 2013-10-03 VITALS — BP 135/87 | HR 77 | Temp 98.2°F | Wt 133.0 lb

## 2013-10-03 DIAGNOSIS — Z7251 High risk heterosexual behavior: Secondary | ICD-10-CM

## 2013-10-03 DIAGNOSIS — I10 Essential (primary) hypertension: Secondary | ICD-10-CM

## 2013-10-03 DIAGNOSIS — Z789 Other specified health status: Secondary | ICD-10-CM

## 2013-10-03 DIAGNOSIS — B20 Human immunodeficiency virus [HIV] disease: Secondary | ICD-10-CM

## 2013-10-03 DIAGNOSIS — F640 Transsexualism: Secondary | ICD-10-CM

## 2013-10-03 LAB — BASIC METABOLIC PANEL WITH GFR
BUN: 12 mg/dL (ref 6–23)
CALCIUM: 9.5 mg/dL (ref 8.4–10.5)
CHLORIDE: 102 meq/L (ref 96–112)
CO2: 26 meq/L (ref 19–32)
Creat: 1.09 mg/dL (ref 0.50–1.35)
GFR, Est African American: >89 mL/min
GFR, Est Non African American: 89 mL/min
Glucose, Bld: 87 mg/dL (ref 70–99)
POTASSIUM: 4.4 meq/L (ref 3.5–5.3)
SODIUM: 135 meq/L (ref 135–145)

## 2013-10-03 MED ORDER — SPIRONOLACTONE 100 MG PO TABS: 100.0000 mg | ORAL_TABLET | Freq: Every day | ORAL | Status: DC

## 2013-10-03 NOTE — Progress Notes (Signed)
  Subjective:    Patient ID: Stephen Griffin, male    DOB: 1980/07/05, 33 y.o.   MRN: [id number]  HPI   33 year old trans gender pt with HIV/AIDS generally noncompliant and with  CD4 count <10, now is being compliant with her VL down below 200 though not perfectly suppressed or undetectable. CD4 is now approaching 200  We have also switched her to aldactone for rx of his transgender issues, and BP. She is also on  estrogen 10mg .   She has no specific complaints today and states she's been compliant with her antiretroviral medications.     Review of Systems  Constitutional: Negative for chills, diaphoresis, activity change, appetite change, fatigue and unexpected weight change.  HENT: Negative for congestion, rhinorrhea, sinus pressure, sneezing, sore throat and trouble swallowing.   Eyes: Negative for photophobia and visual disturbance.  Respiratory: Negative for cough, chest tightness, shortness of breath, wheezing and stridor.   Cardiovascular: Negative for chest pain, palpitations and leg swelling.  Gastrointestinal: Negative for blood in stool, abdominal distention and anal bleeding.  Genitourinary: Negative for flank pain and difficulty urinating.  Musculoskeletal: Negative for back pain, gait problem and joint swelling.  Skin: Negative for color change, pallor and wound.  Neurological: Negative for dizziness, tremors, weakness and light-headedness.  Hematological: Negative for adenopathy. Does not bruise/bleed easily.  Psychiatric/Behavioral: Negative for behavioral problems, confusion, sleep disturbance, dysphoric mood, decreased concentration and agitation.       Objective:   Physical Exam  Constitutional: He is oriented to person, place, and time. He appears well-developed and well-nourished. No distress.  HENT:  Head: Normocephalic and atraumatic.  Mouth/Throat: Oropharynx is clear and moist. No oropharyngeal exudate.  Eyes: Conjunctivae and EOM are normal. No scleral  icterus.  Neck: Normal range of motion. Neck supple. No JVD present.  Cardiovascular: Normal rate, regular rhythm and normal heart sounds.  Exam reveals no gallop and no friction rub.   No murmur heard. Pulmonary/Chest: Effort normal and breath sounds normal. No respiratory distress. He has no wheezes. He has no rales.  Abdominal: He exhibits no distension. There is no tenderness. There is no rebound.  Musculoskeletal: He exhibits no edema and no tenderness.  Lymphadenopathy:    He has no cervical adenopathy.  Neurological: He is alert and oriented to person, place, and time. He exhibits normal muscle tone. Coordination normal.  Skin: Skin is warm and dry. He is not diaphoretic. No erythema. No pallor.  Psychiatric: He has a normal mood and affect. His behavior is normal. Judgment and thought content normal.          Assessment & Plan:   HIV: continue Tivicay and Truvada, check her VL today and CD4 today  She needs to be on bactrim but hopefully we can dispense with the azithro   I REMAIN very PROUD of her! I spent greater than 25  minutes with the patient including greater than 50% of time in face to face counsel of the patient and in coordination of their care  High risk sexual behavior: Needs to use protection, condoms given again and she asked for them  --check VL and CD4 at  Next visit  Transgender:   -- continue  ALDACTONE but increased 100 mg daily and check a BMP next week  --Continue estradiol to 10 mg every 2 weeks intramuscular     HTN: well controlled on aldactone alone

## 2013-10-03 NOTE — Patient Instructions (Signed)
MAKE AN RN VISIT FOR NEXT WED FOR BP CHECK AND BMP CHECK

## 2013-10-04 LAB — T-HELPER CELL (CD4) - (RCID CLINIC ONLY)
CD4 % Helper T Cell: 8 % — ABNORMAL LOW (ref 33–55)
CD4 T CELL ABS: 110 /uL — AB (ref 400–2700)

## 2013-10-07 ENCOUNTER — Other Ambulatory Visit: Payer: Self-pay | Admitting: Infectious Diseases

## 2013-10-07 ENCOUNTER — Other Ambulatory Visit: Payer: Self-pay | Admitting: Infectious Disease

## 2013-10-07 DIAGNOSIS — B20 Human immunodeficiency virus [HIV] disease: Secondary | ICD-10-CM

## 2013-10-07 LAB — HIV-1 RNA QUANT-NO REFLEX-BLD
HIV 1 RNA Quant: 42 copies/mL — ABNORMAL HIGH (ref <20)
HIV-1 RNA Quant, Log: 1.62 {Log} — ABNORMAL HIGH (ref <1.30)

## 2013-10-09 NOTE — Progress Notes (Signed)
"  Chocolate still essentially suppressed still!!

## 2013-10-10 ENCOUNTER — Ambulatory Visit: Payer: Medicaid Other

## 2013-11-04 ENCOUNTER — Telehealth: Payer: Self-pay | Admitting: *Deleted

## 2013-11-04 ENCOUNTER — Ambulatory Visit: Payer: Medicaid Other | Admitting: Infectious Disease

## 2013-11-04 ENCOUNTER — Encounter: Payer: Self-pay | Admitting: *Deleted

## 2013-11-04 NOTE — Telephone Encounter (Signed)
Requested pt call for a new appt.  Number given.

## 2013-11-06 ENCOUNTER — Ambulatory Visit (INDEPENDENT_AMBULATORY_CARE_PROVIDER_SITE_OTHER): Payer: Medicaid Other | Admitting: Infectious Disease

## 2013-11-06 ENCOUNTER — Encounter: Payer: Self-pay | Admitting: Infectious Disease

## 2013-11-06 VITALS — BP 122/79 | HR 93 | Temp 98.0°F | Wt 131.0 lb

## 2013-11-06 DIAGNOSIS — Z789 Other specified health status: Secondary | ICD-10-CM

## 2013-11-06 DIAGNOSIS — F640 Transsexualism: Secondary | ICD-10-CM

## 2013-11-06 DIAGNOSIS — I10 Essential (primary) hypertension: Secondary | ICD-10-CM

## 2013-11-06 DIAGNOSIS — B20 Human immunodeficiency virus [HIV] disease: Secondary | ICD-10-CM

## 2013-11-06 NOTE — Progress Notes (Signed)
   Subjective:    Patient ID: Stephen Griffin, male    DOB: 12-29-1980, 33 y.o.   MRN: [id number]  HPI   Subjective:    Patient ID: Stephen Griffin, male    DOB: [birth year], 81 y.o.   MRN: [id number]    33 year old trans gender pt with HIV/AIDS generally noncompliant and with  CD4 count <10, now is being compliant with her VL down below 42  though not perfectly suppressed or undetectable. CD4 is at 120 peak was 160.   We have also switched her to aldactone to maximize anti-androgen therapy along with estrogen injectable for  transgender issues.     Review of Systems  Constitutional: Negative for chills, diaphoresis, activity change and appetite change.  HENT: Negative for congestion and dental problem.   Eyes: Negative for photophobia.  Respiratory: Negative for cough.   Cardiovascular: Negative for leg swelling.  Gastrointestinal: Negative for nausea, vomiting and diarrhea.  Genitourinary: Negative for dysuria and flank pain.  Musculoskeletal: Negative for arthralgias and back pain.  Neurological: Negative for light-headedness.  Psychiatric/Behavioral: Negative for behavioral problems, confusion, dysphoric mood, decreased concentration and agitation.       Objective:   Physical Exam  Constitutional: He is oriented to person, place, and time. He appears well-developed and well-nourished.  HENT:  Head: Normocephalic and atraumatic.  Eyes: Conjunctivae and EOM are normal.  Neck: Normal range of motion. Neck supple.  Cardiovascular: Normal rate and regular rhythm.   Pulmonary/Chest: Effort normal. He has no wheezes.  Abdominal: He exhibits no distension.  Musculoskeletal: He exhibits no edema.  Neurological: He is alert and oriented to person, place, and time.  Skin: Skin is warm and dry. No rash noted. No erythema.  Psychiatric: He has a normal mood and affect. His behavior is normal. Judgment and thought content normal.          Assessment & Plan:   HIV: continue  Tivicay and Truvada, check her VL today and CD4 today  Dc AZITHROMYCIN, SHE ALSO CLAIMED TO BE TAKING VALCYTE WHICH SHE DOES NOT NEED AND COULD BE suppressing her marrow  I REMAIN very PROUD of her!   --check VL and CD4 at  Next visit  Transgender:   -- continue  ALDACTONE but increased 100 mg daily and check a BMP next week  --Continue estradiol to 10 mg every 2 weeks intramuscular     HTN: well controlled on aldactone alone

## 2013-11-08 ENCOUNTER — Other Ambulatory Visit: Payer: Self-pay | Admitting: Infectious Diseases

## 2013-11-08 ENCOUNTER — Other Ambulatory Visit: Payer: Self-pay | Admitting: Infectious Disease

## 2013-12-02 ENCOUNTER — Other Ambulatory Visit: Payer: Self-pay | Admitting: Infectious Disease

## 2013-12-04 ENCOUNTER — Other Ambulatory Visit: Payer: Self-pay | Admitting: *Deleted

## 2013-12-04 DIAGNOSIS — F640 Transsexualism: Secondary | ICD-10-CM

## 2013-12-04 DIAGNOSIS — Z789 Other specified health status: Secondary | ICD-10-CM

## 2013-12-04 MED ORDER — ESTRADIOL VALERATE 10 MG/ML IM OIL: 10.0000 mg | TOPICAL_OIL | INTRAMUSCULAR | Status: DC

## 2013-12-04 MED ORDER — "SYRINGE 18G X 1-1/2"" 3 ML MISC": 1.0000 | Status: DC

## 2013-12-09 ENCOUNTER — Other Ambulatory Visit: Payer: Self-pay | Admitting: Infectious Disease

## 2013-12-31 ENCOUNTER — Other Ambulatory Visit: Payer: Self-pay | Admitting: Infectious Disease

## 2014-01-06 ENCOUNTER — Other Ambulatory Visit: Payer: Medicaid Other

## 2014-01-15 ENCOUNTER — Ambulatory Visit (INDEPENDENT_AMBULATORY_CARE_PROVIDER_SITE_OTHER): Payer: Medicaid Other | Admitting: Infectious Disease

## 2014-01-15 ENCOUNTER — Encounter: Payer: Self-pay | Admitting: Infectious Disease

## 2014-01-15 ENCOUNTER — Other Ambulatory Visit (HOSPITAL_COMMUNITY)
Admission: RE | Admit: 2014-01-15 | Discharge: 2014-01-15 | Disposition: A | Discharge status: Home / Self Care | Payer: Medicaid Other | Source: Ambulatory Visit | Attending: Infectious Disease | Admitting: Infectious Disease

## 2014-01-15 VITALS — BP 121/83 | HR 76 | Temp 97.8°F | Wt 139.0 lb

## 2014-01-15 DIAGNOSIS — Z113 Encounter for screening for infections with a predominantly sexual mode of transmission: Secondary | ICD-10-CM | POA: Insufficient documentation

## 2014-01-15 DIAGNOSIS — Z23 Encounter for immunization: Secondary | ICD-10-CM | POA: Diagnosis not present

## 2014-01-15 DIAGNOSIS — F640 Transsexualism: Secondary | ICD-10-CM | POA: Diagnosis not present

## 2014-01-15 DIAGNOSIS — Z79899 Other long term (current) drug therapy: Secondary | ICD-10-CM | POA: Diagnosis not present

## 2014-01-15 DIAGNOSIS — B20 Human immunodeficiency virus [HIV] disease: Secondary | ICD-10-CM

## 2014-01-15 DIAGNOSIS — Z789 Other specified health status: Secondary | ICD-10-CM

## 2014-01-15 LAB — CBC WITH DIFFERENTIAL/PLATELET
Basophils Absolute: 0.1 10*3/uL (ref 0.0–0.1)
Basophils Relative: 1 % (ref 0–1)
EOS ABS: 0.1 10*3/uL (ref 0.0–0.7)
EOS PCT: 2 % (ref 0–5)
HEMATOCRIT: 39.1 % (ref 39.0–52.0)
HEMOGLOBIN: 13.2 g/dL (ref 13.0–17.0)
LYMPHS ABS: 1.4 10*3/uL (ref 0.7–4.0)
LYMPHS PCT: 20 % (ref 12–46)
MCH: 32.8 pg (ref 26.0–34.0)
MCHC: 33.8 g/dL (ref 30.0–36.0)
MCV: 97.3 fL (ref 78.0–100.0)
MONO ABS: 0.7 10*3/uL (ref 0.1–1.0)
MONOS PCT: 10 % (ref 3–12)
Neutro Abs: 4.8 10*3/uL (ref 1.7–7.7)
Neutrophils Relative %: 67 % (ref 43–77)
Platelets: 416 10*3/uL — ABNORMAL HIGH (ref 150–400)
RBC: 4.02 MIL/uL — AB (ref 4.22–5.81)
RDW: 11.9 % (ref 11.5–15.5)
WBC: 7.2 10*3/uL (ref 4.0–10.5)

## 2014-01-15 LAB — COMPLETE METABOLIC PANEL WITH GFR
ALK PHOS: 105 U/L (ref 39–117)
ALT: 25 U/L (ref 0–53)
AST: 30 U/L (ref 0–37)
Albumin: 4.4 g/dL (ref 3.5–5.2)
BUN: 7 mg/dL (ref 6–23)
CHLORIDE: 100 meq/L (ref 96–112)
CO2: 24 mEq/L (ref 19–32)
CREATININE: 0.88 mg/dL (ref 0.50–1.35)
Calcium: 9.2 mg/dL (ref 8.4–10.5)
GFR, Est African American: >89 mL/min
GFR, Est Non African American: >89 mL/min
Glucose, Bld: 69 mg/dL — ABNORMAL LOW (ref 70–99)
Potassium: 4.1 mEq/L (ref 3.5–5.3)
Sodium: 136 mEq/L (ref 135–145)
Total Bilirubin: 0.5 mg/dL (ref 0.2–1.2)
Total Protein: 7.8 g/dL (ref 6.0–8.3)

## 2014-01-15 LAB — LIPID PANEL
CHOL/HDL RATIO: 2.2 ratio
Cholesterol: 106 mg/dL (ref 0–200)
HDL: 48 mg/dL (ref >39)
LDL CALC: 34 mg/dL (ref 0–99)
TRIGLYCERIDES: 119 mg/dL (ref <150)
VLDL: 24 mg/dL (ref 0–40)

## 2014-01-15 LAB — RPR

## 2014-01-15 NOTE — Progress Notes (Signed)
   Subjective:    Patient ID: Stephen Griffin, male    DOB: 1981-02-07, 33 y.o.   MRN: [id number]  HPI    Patient ID: Stephen Griffin, male    DOB: 12-May-1981, 33 y.o.   MRN: [id number]   33 year old trans gender pt with HIV/AIDS generally noncompliant and with  CD4 count <10, now is being compliant with her VL down below 42  though not perfectly suppressed or undetectable. CD4 is at 120 peak was 160.   We have also switched her to aldactone to maximize anti-androgen therapy along with estrogen injectable for  transgender issues.  She returns to [facility] for followup stating that she has only missed 1-2 doses in the past month.  Lab Results  Component Value Date   HIV1RNAQUANT 42* 10/03/2013   Lab Results  Component Value Date   CD4TABS 110* 10/03/2013   CD4TABS 160* 08/15/2013   CD4TABS 120* 08/01/2013       Review of Systems  Constitutional: Negative for chills, diaphoresis, activity change and appetite change.  HENT: Negative for congestion and dental problem.   Eyes: Negative for photophobia.  Respiratory: Negative for cough.   Cardiovascular: Negative for leg swelling.  Gastrointestinal: Negative for nausea, vomiting and diarrhea.  Genitourinary: Negative for dysuria and flank pain.  Musculoskeletal: Negative for arthralgias and back pain.  Neurological: Negative for light-headedness.  Psychiatric/Behavioral: Negative for behavioral problems, confusion, dysphoric mood, decreased concentration and agitation.       Objective:   Physical Exam  Constitutional: He is oriented to person, place, and time. He appears well-developed and well-nourished.  HENT:  Head: Normocephalic and atraumatic.  Eyes: Conjunctivae and EOM are normal.  Neck: Normal range of motion. Neck supple.  Cardiovascular: Normal rate and regular rhythm.   Pulmonary/Chest: Effort normal. He has no wheezes.  Abdominal: He exhibits no distension.  Musculoskeletal: He exhibits no edema.  Neurological: He is  alert and oriented to person, place, and time.  Skin: Skin is warm and dry. No rash noted. No erythema.  Psychiatric: He has a normal mood and affect. His behavior is normal. Judgment and thought content normal.          Assessment & Plan:   HIV: continue Tivicay and Truvada, check her VL today and CD4 today  Dc AZITHROMYCIN,   Recheck  VL and CD4 rtc in early September (the 3rd)  Transgender:   -- continue  ALDACTONE but increased 100 mg daily and check a BMP next week  --Continue estradiol to 10 mg every 2 weeks intramuscular     HTN: well controlled on aldactone alone

## 2014-01-15 NOTE — Patient Instructions (Signed)
Thanks so much for taking such good care of yourself!  We will check blood work today  PLEASE MAKE APPT ON September 3RD IN the afternoon

## 2014-01-16 LAB — T-HELPER CELL (CD4) - (RCID CLINIC ONLY)
CD4 % Helper T Cell: 13 % — ABNORMAL LOW (ref 33–55)
CD4 T CELL ABS: 210 /uL — AB (ref 400–2700)

## 2014-01-17 LAB — HIV-1 RNA ULTRAQUANT REFLEX TO GENTYP+
HIV 1 RNA QUANT: 24 {copies}/mL — AB (ref <20)
HIV-1 RNA QUANT, LOG: 1.38 {Log} — AB (ref <1.30)

## 2014-01-30 ENCOUNTER — Ambulatory Visit (INDEPENDENT_AMBULATORY_CARE_PROVIDER_SITE_OTHER): Payer: Medicaid Other | Admitting: Infectious Disease

## 2014-01-30 ENCOUNTER — Encounter: Payer: Self-pay | Admitting: Infectious Disease

## 2014-01-30 VITALS — BP 133/89 | HR 73 | Temp 98.2°F | Ht 70.0 in | Wt 140.5 lb

## 2014-01-30 DIAGNOSIS — I10 Essential (primary) hypertension: Secondary | ICD-10-CM

## 2014-01-30 DIAGNOSIS — Z23 Encounter for immunization: Secondary | ICD-10-CM

## 2014-01-30 DIAGNOSIS — Z789 Other specified health status: Secondary | ICD-10-CM

## 2014-01-30 DIAGNOSIS — F640 Transsexualism: Secondary | ICD-10-CM | POA: Diagnosis not present

## 2014-01-30 DIAGNOSIS — B20 Human immunodeficiency virus [HIV] disease: Secondary | ICD-10-CM

## 2014-01-30 MED ORDER — SPIRONOLACTONE 50 MG PO TABS: 50.0000 mg | ORAL_TABLET | Freq: Every day | ORAL | Status: DC

## 2014-01-30 NOTE — Patient Instructions (Signed)
We need RN visit for BMP panel and BP check in 2 weeks

## 2014-01-30 NOTE — Progress Notes (Signed)
   Subjective:    Patient ID: Stephen Griffin, male    DOB: July 11, 1980, 33 y.o.   MRN: [id number]  HPI    Patient ID: Stephen Griffin, male    DOB: [birth year], 21 y.o.   MRN: [id number]   33 year old trans gender pt with HIV/AIDS generally noncompliant and with  CD4 count <10, now is being compliant with her VL down below 42  though not perfectly suppressed or undetectable. CD4 is at 120 peak was 160.   We have also switched her to aldactone to maximize anti-androgen therapy along with estrogen injectable for  transgender issues.  She returns to [facility] for followup  And has clearly been doing great.  Lab Results  Component Value Date   HIV1RNAQUANT 24* 01/15/2014   Lab Results  Component Value Date   CD4TABS 210* 01/15/2014   CD4TABS 110* 10/03/2013   CD4TABS 160* 08/15/2013    I asked her what did we do differently, obviously she has been the biggest part of turnaround in her control of the virus. She said that our part was to make her motivated and part of that was envisioning herself in the future with HIV under control and transgender achieved.    Review of Systems  Constitutional: Negative for chills, diaphoresis, activity change and appetite change.  HENT: Negative for congestion and dental problem.   Eyes: Negative for photophobia.  Respiratory: Negative for cough.   Cardiovascular: Negative for leg swelling.  Gastrointestinal: Negative for nausea, vomiting and diarrhea.  Genitourinary: Negative for dysuria and flank pain.  Musculoskeletal: Negative for arthralgias and back pain.  Neurological: Negative for light-headedness.  Psychiatric/Behavioral: Negative for behavioral problems, confusion, dysphoric mood, decreased concentration and agitation.       Objective:   Physical Exam  Constitutional: He is oriented to person, place, and time. He appears well-developed and well-nourished.  HENT:  Head: Normocephalic and atraumatic.  Eyes: Conjunctivae and EOM are normal.    Neck: Normal range of motion. Neck supple.  Cardiovascular: Normal rate and regular rhythm.   Pulmonary/Chest: Effort normal. He has no wheezes.  Abdominal: He exhibits no distension.  Musculoskeletal: He exhibits no edema.  Neurological: He is alert and oriented to person, place, and time.  Skin: Skin is warm and dry. No rash noted. No erythema.  Psychiatric: He has a normal mood and affect. His behavior is normal. Judgment and thought content normal.          Assessment & Plan:   HIV: continue Tivicay and Truvada,   Recheck  VL and CD4 rtc in early October  And RTC to see me then. If CD4 still above 200 will drop the bactrim  I spent greater than 25 minutes with the patient including greater than 50% of time in face to face counsel of the patient and in coordination of their care.   Transgender:   -- continue  ALDACTONE but increased to 150 mg daily and check a BMP 2 weeks --Continue estradiol to 10 mg every 2 weeks intramuscular     HTN: well controlled on aldactone alone

## 2014-02-04 ENCOUNTER — Other Ambulatory Visit: Payer: Self-pay | Admitting: Infectious Disease

## 2014-02-10 ENCOUNTER — Other Ambulatory Visit: Payer: Medicaid Other

## 2014-02-10 ENCOUNTER — Ambulatory Visit (INDEPENDENT_AMBULATORY_CARE_PROVIDER_SITE_OTHER): Payer: Medicaid Other | Admitting: *Deleted

## 2014-02-10 VITALS — BP 128/84 | HR 71

## 2014-02-10 DIAGNOSIS — B20 Human immunodeficiency virus [HIV] disease: Secondary | ICD-10-CM

## 2014-02-10 DIAGNOSIS — Z0130 Encounter for examination of blood pressure without abnormal findings: Secondary | ICD-10-CM

## 2014-02-10 DIAGNOSIS — Z136 Encounter for screening for cardiovascular disorders: Secondary | ICD-10-CM

## 2014-02-11 ENCOUNTER — Telehealth: Payer: Self-pay | Admitting: Infectious Disease

## 2014-02-11 LAB — BASIC METABOLIC PANEL WITH GFR
BUN: 8 mg/dL (ref 6–23)
CALCIUM: 9.4 mg/dL (ref 8.4–10.5)
CO2: 24 meq/L (ref 19–32)
CREATININE: 0.95 mg/dL (ref 0.50–1.35)
Chloride: 106 mEq/L (ref 96–112)
GFR, Est African American: >89 mL/min
GFR, Est Non African American: >89 mL/min
GLUCOSE: 77 mg/dL (ref 70–99)
Potassium: 5.4 mEq/L — ABNORMAL HIGH (ref 3.5–5.3)
Sodium: 139 mEq/L (ref 135–145)

## 2014-02-11 NOTE — Telephone Encounter (Signed)
Message left for pt to "stop" 50 mg tablet of aldactone/spironolactone and continue 100 mg aldactone/spironolactone tablet.  Requested that the pt call back if she has any questions.

## 2014-02-11 NOTE — Telephone Encounter (Signed)
Can someone call Stephen Griffin and ask her to drop the 50mg  part of her spironolactone dose her potassium is now on the high side and she should back down to just 100mg  tablet of the spironolactone  Thanks

## 2014-02-11 NOTE — Telephone Encounter (Signed)
THANKS DENISE!

## 2014-02-18 ENCOUNTER — Telehealth: Payer: Self-pay | Admitting: *Deleted

## 2014-02-18 DIAGNOSIS — F640 Transsexualism: Secondary | ICD-10-CM

## 2014-02-18 DIAGNOSIS — Z789 Other specified health status: Secondary | ICD-10-CM

## 2014-02-18 NOTE — Telephone Encounter (Signed)
Pt has refills available for the medication and the syringe.  Let pt know he should call to request refills.

## 2014-02-25 ENCOUNTER — Other Ambulatory Visit: Payer: Self-pay | Admitting: Infectious Diseases

## 2014-02-25 ENCOUNTER — Other Ambulatory Visit: Payer: Self-pay | Admitting: Infectious Disease

## 2014-03-03 ENCOUNTER — Other Ambulatory Visit (HOSPITAL_COMMUNITY)
Admission: RE | Admit: 2014-03-03 | Discharge: 2014-03-03 | Disposition: A | Discharge status: Home / Self Care | Payer: Medicaid Other | Source: Ambulatory Visit | Attending: Infectious Disease | Admitting: Infectious Disease

## 2014-03-03 ENCOUNTER — Other Ambulatory Visit: Payer: Medicaid Other

## 2014-03-03 DIAGNOSIS — G8929 Other chronic pain: Secondary | ICD-10-CM

## 2014-03-03 DIAGNOSIS — Z113 Encounter for screening for infections with a predominantly sexual mode of transmission: Secondary | ICD-10-CM | POA: Insufficient documentation

## 2014-03-03 DIAGNOSIS — B3781 Candidal esophagitis: Secondary | ICD-10-CM

## 2014-03-03 DIAGNOSIS — F1010 Alcohol abuse, uncomplicated: Secondary | ICD-10-CM

## 2014-03-03 DIAGNOSIS — Z7251 High risk heterosexual behavior: Secondary | ICD-10-CM

## 2014-03-03 DIAGNOSIS — R1013 Epigastric pain: Secondary | ICD-10-CM

## 2014-03-03 DIAGNOSIS — Z23 Encounter for immunization: Secondary | ICD-10-CM

## 2014-03-03 DIAGNOSIS — B20 Human immunodeficiency virus [HIV] disease: Secondary | ICD-10-CM

## 2014-03-03 LAB — CBC WITH DIFFERENTIAL/PLATELET
Basophils Absolute: 0.1 10*3/uL (ref 0.0–0.1)
Basophils Relative: 1 % (ref 0–1)
Eosinophils Absolute: 0.6 10*3/uL (ref 0.0–0.7)
Eosinophils Relative: 8 % — ABNORMAL HIGH (ref 0–5)
HEMATOCRIT: 38.9 % — AB (ref 39.0–52.0)
Hemoglobin: 12.9 g/dL — ABNORMAL LOW (ref 13.0–17.0)
LYMPHS ABS: 1.7 10*3/uL (ref 0.7–4.0)
LYMPHS PCT: 23 % (ref 12–46)
MCH: 31.9 pg (ref 26.0–34.0)
MCHC: 33.2 g/dL (ref 30.0–36.0)
MCV: 96.3 fL (ref 78.0–100.0)
MONO ABS: 0.7 10*3/uL (ref 0.1–1.0)
Monocytes Relative: 9 % (ref 3–12)
NEUTROS ABS: 4.3 10*3/uL (ref 1.7–7.7)
Neutrophils Relative %: 59 % (ref 43–77)
Platelets: 455 10*3/uL — ABNORMAL HIGH (ref 150–400)
RBC: 4.04 MIL/uL — AB (ref 4.22–5.81)
RDW: 12.1 % (ref 11.5–15.5)
WBC: 7.3 10*3/uL (ref 4.0–10.5)

## 2014-03-04 LAB — COMPLETE METABOLIC PANEL WITH GFR
ALBUMIN: 3.9 g/dL (ref 3.5–5.2)
ALT: 29 U/L (ref 0–53)
AST: 32 U/L (ref 0–37)
Alkaline Phosphatase: 79 U/L (ref 39–117)
BUN: 8 mg/dL (ref 6–23)
CHLORIDE: 103 meq/L (ref 96–112)
CO2: 25 meq/L (ref 19–32)
Calcium: 9.1 mg/dL (ref 8.4–10.5)
Creat: 0.85 mg/dL (ref 0.50–1.35)
GFR, Est African American: >89 mL/min
GFR, Est Non African American: >89 mL/min
Glucose, Bld: 87 mg/dL (ref 70–99)
Potassium: 3.9 mEq/L (ref 3.5–5.3)
Sodium: 135 mEq/L (ref 135–145)
TOTAL PROTEIN: 6.8 g/dL (ref 6.0–8.3)
Total Bilirubin: 0.4 mg/dL (ref 0.2–1.2)

## 2014-03-04 LAB — RPR

## 2014-03-04 LAB — URINE CYTOLOGY ANCILLARY ONLY
Chlamydia: NEGATIVE
Neisseria Gonorrhea: NEGATIVE

## 2014-03-04 LAB — T-HELPER CELL (CD4) - (RCID CLINIC ONLY)
CD4 T CELL ABS: 250 /uL — AB (ref 400–2700)
CD4 T CELL HELPER: 15 % — AB (ref 33–55)

## 2014-03-05 LAB — HIV-1 RNA ULTRAQUANT REFLEX TO GENTYP+
HIV 1 RNA Quant: 38 copies/mL — ABNORMAL HIGH (ref <20)
HIV-1 RNA Quant, Log: 1.58 {Log} — ABNORMAL HIGH (ref <1.30)

## 2014-03-12 LAB — HLA B*5701: HLA-B*5701 w/rflx HLA-B High: NEGATIVE

## 2014-03-17 ENCOUNTER — Ambulatory Visit: Payer: Medicaid Other | Admitting: Infectious Disease

## 2014-03-19 ENCOUNTER — Ambulatory Visit: Payer: Medicaid Other | Admitting: Infectious Disease

## 2014-03-31 ENCOUNTER — Other Ambulatory Visit: Payer: Self-pay | Admitting: Infectious Disease

## 2014-03-31 DIAGNOSIS — IMO0001 Reserved for inherently not codable concepts without codable children: Secondary | ICD-10-CM

## 2014-03-31 DIAGNOSIS — F640 Transsexualism: Principal | ICD-10-CM

## 2014-03-31 DIAGNOSIS — E349 Endocrine disorder, unspecified: Secondary | ICD-10-CM

## 2014-04-16 ENCOUNTER — Ambulatory Visit (INDEPENDENT_AMBULATORY_CARE_PROVIDER_SITE_OTHER): Payer: Medicaid Other | Admitting: Infectious Disease

## 2014-04-16 ENCOUNTER — Encounter: Payer: Self-pay | Admitting: Infectious Disease

## 2014-04-16 VITALS — BP 118/80 | HR 90 | Temp 97.9°F | Wt 136.0 lb

## 2014-04-16 DIAGNOSIS — I10 Essential (primary) hypertension: Secondary | ICD-10-CM

## 2014-04-16 DIAGNOSIS — F641 Gender identity disorder in adolescence and adulthood: Secondary | ICD-10-CM

## 2014-04-16 DIAGNOSIS — F640 Transsexualism: Secondary | ICD-10-CM

## 2014-04-16 DIAGNOSIS — B20 Human immunodeficiency virus [HIV] disease: Secondary | ICD-10-CM

## 2014-04-16 DIAGNOSIS — F5000 Anorexia nervosa, unspecified: Secondary | ICD-10-CM

## 2014-04-16 DIAGNOSIS — Z7251 High risk heterosexual behavior: Secondary | ICD-10-CM

## 2014-04-16 DIAGNOSIS — Z789 Other specified health status: Secondary | ICD-10-CM

## 2014-04-16 DIAGNOSIS — R630 Anorexia: Secondary | ICD-10-CM

## 2014-04-16 MED ORDER — DRONABINOL 5 MG PO CAPS: 5.0000 mg | ORAL_CAPSULE | Freq: Two times a day (BID) | ORAL | Status: DC

## 2014-04-16 MED ORDER — SPIRONOLACTONE 100 MG PO TABS: 150.0000 mg | ORAL_TABLET | Freq: Every day | ORAL | Status: DC

## 2014-04-16 MED ORDER — SPIRONOLACTONE 100 MG PO TABS: 100.0000 mg | ORAL_TABLET | Freq: Every day | ORAL | Status: DC

## 2014-04-16 NOTE — Progress Notes (Signed)
   Subjective:    Patient ID: Stephen Griffin, male    DOB: [birth year], 33 y.o.   MRN: [id number]  HPI    Patient ID: Stephen Griffin, male    DOB: [birth year], 33 y.o.   MRN: [id number]   33 year old trans gender pt with HIV/AIDS generally noncompliant and with  CD4 count <10, now is being compliant with her VL down below 38  though not perfectly suppressed or undetectable. CD4 is at 250!!   We have also switched her to aldactone to maximize anti-androgen therapy along with estrogen injectable for  transgender issues.  She returns to [facility] for followup  And has clearly been doing great.   Lab Results  Component Value Date   HIV1RNAQUANT 38* 03/03/2014   Lab Results  Component Value Date   CD4TABS 250* 03/03/2014   CD4TABS 210* 01/15/2014   CD4TABS 110* 10/03/2013    I will make that change today while dropping the amlodipine.      Review of Systems  Constitutional: Negative for chills, diaphoresis, activity change and appetite change.  HENT: Negative for congestion and dental problem.   Eyes: Negative for photophobia.  Respiratory: Negative for cough.   Cardiovascular: Negative for leg swelling.  Gastrointestinal: Negative for nausea, vomiting and diarrhea.  Genitourinary: Negative for dysuria and flank pain.  Musculoskeletal: Negative for back pain and arthralgias.  Neurological: Negative for light-headedness.  Psychiatric/Behavioral: Negative for behavioral problems, confusion, dysphoric mood, decreased concentration and agitation.       Objective:   Physical Exam         Assessment & Plan:   HIV: continue Tivicay and Truvada,   Recheck  VL and CD4 rtc in 3 months  DC bactrim, fluconazole   I spent greater than 25 minutes with the patient including greater than 50% of time in face to face counsel of the patient and in coordination of their care.   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

## 2014-04-28 ENCOUNTER — Other Ambulatory Visit: Payer: Self-pay | Admitting: Infectious Disease

## 2014-04-28 NOTE — Telephone Encounter (Signed)
Will send the doctor a message to the doctor to see if ok to fill.

## 2014-04-28 NOTE — Telephone Encounter (Signed)
Does she need this?

## 2014-05-02 MED ORDER — "SYRINGE/NEEDLE (DISP) 23G X 1"" 3 ML MISC": Status: DC

## 2014-05-02 NOTE — Addendum Note (Signed)
Addended by: Reggy Eye on: 05/02/2014 04:11 PM   Modules accepted: Orders

## 2014-05-26 ENCOUNTER — Other Ambulatory Visit: Payer: Self-pay | Admitting: Infectious Disease

## 2014-06-17 ENCOUNTER — Other Ambulatory Visit: Payer: Self-pay | Admitting: Infectious Disease

## 2014-06-23 ENCOUNTER — Other Ambulatory Visit: Payer: Self-pay | Admitting: Infectious Disease

## 2014-07-14 ENCOUNTER — Other Ambulatory Visit: Payer: Medicaid Other

## 2014-07-21 ENCOUNTER — Other Ambulatory Visit: Payer: Medicaid Other

## 2014-07-28 ENCOUNTER — Ambulatory Visit (INDEPENDENT_AMBULATORY_CARE_PROVIDER_SITE_OTHER): Payer: Medicaid Other | Admitting: Infectious Disease

## 2014-07-28 ENCOUNTER — Telehealth: Payer: Self-pay | Admitting: *Deleted

## 2014-07-28 ENCOUNTER — Encounter: Payer: Self-pay | Admitting: Infectious Disease

## 2014-07-28 ENCOUNTER — Other Ambulatory Visit (HOSPITAL_COMMUNITY)
Admission: RE | Admit: 2014-07-28 | Discharge: 2014-07-28 | Disposition: A | Discharge status: Home / Self Care | Payer: Medicaid Other | Source: Ambulatory Visit | Attending: Infectious Disease | Admitting: Infectious Disease

## 2014-07-28 ENCOUNTER — Other Ambulatory Visit: Payer: Self-pay | Admitting: Infectious Disease

## 2014-07-28 VITALS — BP 127/86 | HR 85 | Temp 98.5°F | Wt 141.0 lb

## 2014-07-28 DIAGNOSIS — IMO0001 Reserved for inherently not codable concepts without codable children: Secondary | ICD-10-CM

## 2014-07-28 DIAGNOSIS — I10 Essential (primary) hypertension: Secondary | ICD-10-CM | POA: Insufficient documentation

## 2014-07-28 DIAGNOSIS — B20 Human immunodeficiency virus [HIV] disease: Secondary | ICD-10-CM

## 2014-07-28 DIAGNOSIS — Z113 Encounter for screening for infections with a predominantly sexual mode of transmission: Secondary | ICD-10-CM | POA: Insufficient documentation

## 2014-07-28 DIAGNOSIS — E349 Endocrine disorder, unspecified: Secondary | ICD-10-CM

## 2014-07-28 DIAGNOSIS — Z789 Other specified health status: Secondary | ICD-10-CM

## 2014-07-28 DIAGNOSIS — F641 Gender identity disorder in adolescence and adulthood: Secondary | ICD-10-CM

## 2014-07-28 DIAGNOSIS — L739 Follicular disorder, unspecified: Secondary | ICD-10-CM

## 2014-07-28 DIAGNOSIS — F640 Transsexualism: Principal | ICD-10-CM

## 2014-07-28 MED ORDER — PANTOPRAZOLE SODIUM 20 MG PO TBEC: 20.0000 mg | DELAYED_RELEASE_TABLET | Freq: Every day | ORAL | Status: DC

## 2014-07-28 MED ORDER — SYRINGE/NEEDLE (DISP) 23G X 1" 3 ML MISC
Status: DC
Start: 2014-07-28 — End: 2014-10-29

## 2014-07-28 MED ORDER — TRIAMCINOLONE ACETONIDE 0.5 % EX CREA: 1.0000 "application " | TOPICAL_CREAM | Freq: Two times a day (BID) | CUTANEOUS | Status: DC

## 2014-07-28 NOTE — Telephone Encounter (Signed)
Pt requesting refills for Protonix and triamcinolone cream.  Not on current medication profile.  Please advise and refill if appropriate to [facility].

## 2014-07-28 NOTE — Telephone Encounter (Signed)
That is fine those meds would nto be an issue with her meds hopefully she is being adherent still with Tivicay and Truvada

## 2014-07-28 NOTE — Addendum Note (Signed)
Addended by: Lorne Skeens D on: 07/28/2014 01:58 PM   Modules accepted: Orders, Medications

## 2014-07-28 NOTE — Progress Notes (Signed)
   Subjective:    Patient ID: Stephen Griffin, male    DOB: [birth year], 34 y.o.   MRN: [id number]  HPI   34  year old trans gender pt with HIV/AIDS generally noncompliant and with  CD4 count <10, now is being compliant with her VL down below 38  though not perfectly suppressed or undetectable. CD4 is at 250!!  Lab Results  Component Value Date   HIV1RNAQUANT 38* 03/03/2014   Lab Results  Component Value Date   CD4TABS 250* 03/03/2014   CD4TABS 210* 01/15/2014   CD4TABS 110* 10/03/2013    We have also switched her to aldactone to maximize anti-androgen therapy along with estrogen injectable for  transgender issues.  She is in great spirits and without any complaints today   Review of Systems  Constitutional: Negative for chills, diaphoresis, activity change and appetite change.  HENT: Negative for congestion and dental problem.   Eyes: Negative for photophobia.  Respiratory: Negative for cough.   Cardiovascular: Negative for leg swelling.  Gastrointestinal: Negative for nausea, vomiting and diarrhea.  Genitourinary: Negative for dysuria and flank pain.  Musculoskeletal: Negative for back pain and arthralgias.  Neurological: Negative for light-headedness.  Psychiatric/Behavioral: Negative for behavioral problems, confusion, dysphoric mood, decreased concentration and agitation.       Objective:   Physical Exam  Constitutional: He is oriented to person, place, and time. He appears well-developed and well-nourished.  HENT:  Head: Normocephalic and atraumatic.  Eyes: Conjunctivae and EOM are normal.  Neck: Normal range of motion. Neck supple.  Cardiovascular: Normal rate and regular rhythm.   Pulmonary/Chest: Effort normal. No respiratory distress. He has no wheezes.  Abdominal: Soft. He exhibits no distension.  Musculoskeletal: Normal range of motion. He exhibits no edema or tenderness.  Neurological: He is alert and oriented to person, place, and time.  Skin: Skin is  warm and dry. No rash noted. No erythema. No pallor.  Psychiatric: He has a normal mood and affect. His speech is normal and behavior is normal.          Assessment & Plan:   HIV: continue Tivicay and Truvada,   Recheck  VL and CD4 rtc in 3 months   Transgender:   -- continue  ALDACTONE at  150 mg daily check BMP  --Continue estradiol to 20 mg q 2  weeks intramuscular     HTN: well controlled 150mg  of aldactone  Anorexia: continue marinol

## 2014-07-29 LAB — COMPLETE METABOLIC PANEL WITH GFR
ALK PHOS: 91 U/L (ref 39–117)
ALT: 37 U/L (ref 0–53)
AST: 35 U/L (ref 0–37)
Albumin: 4.5 g/dL (ref 3.5–5.2)
BUN: 12 mg/dL (ref 6–23)
CO2: 25 meq/L (ref 19–32)
Calcium: 10.2 mg/dL (ref 8.4–10.5)
Chloride: 104 mEq/L (ref 96–112)
Creat: 0.9 mg/dL (ref 0.50–1.35)
GFR, Est African American: >89 mL/min
GFR, Est Non African American: >89 mL/min
Glucose, Bld: 79 mg/dL (ref 70–99)
Potassium: 4.5 mEq/L (ref 3.5–5.3)
SODIUM: 139 meq/L (ref 135–145)
Total Bilirubin: 0.3 mg/dL (ref 0.2–1.2)
Total Protein: 8.1 g/dL (ref 6.0–8.3)

## 2014-07-29 LAB — CBC WITH DIFFERENTIAL/PLATELET
Basophils Absolute: 0 10*3/uL (ref 0.0–0.1)
Basophils Relative: 1 % (ref 0–1)
EOS ABS: 0.1 10*3/uL (ref 0.0–0.7)
EOS PCT: 2 % (ref 0–5)
HCT: 42.7 % (ref 39.0–52.0)
Hemoglobin: 13.5 g/dL (ref 13.0–17.0)
Lymphocytes Relative: 30 % (ref 12–46)
Lymphs Abs: 1.4 10*3/uL (ref 0.7–4.0)
MCH: 30.7 pg (ref 26.0–34.0)
MCHC: 31.6 g/dL (ref 30.0–36.0)
MCV: 97 fL (ref 78.0–100.0)
MONOS PCT: 14 % — AB (ref 3–12)
MPV: 9.1 fL (ref 8.6–12.4)
Monocytes Absolute: 0.7 10*3/uL (ref 0.1–1.0)
Neutro Abs: 2.5 10*3/uL (ref 1.7–7.7)
Neutrophils Relative %: 53 % (ref 43–77)
Platelets: 505 10*3/uL — ABNORMAL HIGH (ref 150–400)
RBC: 4.4 MIL/uL (ref 4.22–5.81)
RDW: 11.7 % (ref 11.5–15.5)
WBC: 4.8 10*3/uL (ref 4.0–10.5)

## 2014-07-29 LAB — T-HELPER CELL (CD4) - (RCID CLINIC ONLY)
CD4 % Helper T Cell: 13 % — ABNORMAL LOW (ref 33–55)
CD4 T CELL ABS: 210 /uL — AB (ref 400–2700)

## 2014-07-29 LAB — URINE CYTOLOGY ANCILLARY ONLY
CHLAMYDIA, DNA PROBE: NEGATIVE
NEISSERIA GONORRHEA: NEGATIVE

## 2014-07-29 LAB — RPR

## 2014-07-30 LAB — HIV-1 RNA ULTRAQUANT REFLEX TO GENTYP+
HIV 1 RNA QUANT: 26 {copies}/mL — AB (ref <20)
HIV-1 RNA QUANT, LOG: 1.41 {Log} — AB (ref <1.30)

## 2014-08-06 LAB — HIV-1 INTEGRASE GENOTYPE

## 2014-08-21 ENCOUNTER — Other Ambulatory Visit: Payer: Self-pay | Admitting: Infectious Disease

## 2014-10-14 ENCOUNTER — Telehealth: Payer: Self-pay | Admitting: *Deleted

## 2014-10-14 NOTE — Telephone Encounter (Signed)
PA for Megace ES received - non-preferred rx with Medicaid.  Placed fax and Medicaid PA form in MD's box for review and signature.

## 2014-10-22 ENCOUNTER — Telehealth: Payer: Self-pay | Admitting: *Deleted

## 2014-10-22 ENCOUNTER — Other Ambulatory Visit: Payer: Self-pay | Admitting: Infectious Disease

## 2014-10-22 DIAGNOSIS — I10 Essential (primary) hypertension: Secondary | ICD-10-CM

## 2014-10-22 DIAGNOSIS — Z7251 High risk heterosexual behavior: Secondary | ICD-10-CM

## 2014-10-22 DIAGNOSIS — Z789 Other specified health status: Secondary | ICD-10-CM

## 2014-10-22 DIAGNOSIS — B20 Human immunodeficiency virus [HIV] disease: Secondary | ICD-10-CM

## 2014-10-22 DIAGNOSIS — F640 Transsexualism: Secondary | ICD-10-CM

## 2014-10-22 MED ORDER — DRONABINOL 5 MG PO CAPS: 5.0000 mg | ORAL_CAPSULE | Freq: Two times a day (BID) | ORAL | Status: DC

## 2014-10-22 NOTE — Telephone Encounter (Signed)
RN spoke with the pharmacy.  The patient had not started the Marinol rx per the pharmacy.  The pt had not used up all of the previous refills for Megace ES from Sept. 2015.  RN phoned in prescription for Marinol.

## 2014-10-28 ENCOUNTER — Other Ambulatory Visit (HOSPITAL_COMMUNITY)
Admission: RE | Admit: 2014-10-28 | Discharge: 2014-10-28 | Disposition: A | Discharge status: Home / Self Care | Payer: Medicaid Other | Source: Ambulatory Visit | Attending: Infectious Disease | Admitting: Infectious Disease

## 2014-10-28 ENCOUNTER — Encounter: Payer: Self-pay | Admitting: Infectious Disease

## 2014-10-28 ENCOUNTER — Ambulatory Visit (INDEPENDENT_AMBULATORY_CARE_PROVIDER_SITE_OTHER): Payer: Medicaid Other | Admitting: Infectious Disease

## 2014-10-28 VITALS — BP 134/86 | HR 97 | Temp 98.5°F | Ht 71.0 in | Wt 136.0 lb

## 2014-10-28 DIAGNOSIS — R64 Cachexia: Secondary | ICD-10-CM | POA: Diagnosis not present

## 2014-10-28 DIAGNOSIS — I10 Essential (primary) hypertension: Secondary | ICD-10-CM

## 2014-10-28 DIAGNOSIS — B20 Human immunodeficiency virus [HIV] disease: Secondary | ICD-10-CM

## 2014-10-28 DIAGNOSIS — F641 Gender identity disorder in adolescence and adulthood: Secondary | ICD-10-CM | POA: Diagnosis not present

## 2014-10-28 DIAGNOSIS — Z113 Encounter for screening for infections with a predominantly sexual mode of transmission: Secondary | ICD-10-CM | POA: Insufficient documentation

## 2014-10-28 DIAGNOSIS — Z789 Other specified health status: Secondary | ICD-10-CM

## 2014-10-28 DIAGNOSIS — F640 Transsexualism: Secondary | ICD-10-CM

## 2014-10-28 LAB — CBC WITH DIFFERENTIAL/PLATELET
BASOS ABS: 0 10*3/uL (ref 0.0–0.1)
BASOS PCT: 0 % (ref 0–1)
EOS ABS: 0.1 10*3/uL (ref 0.0–0.7)
Eosinophils Relative: 1 % (ref 0–5)
HCT: 37.8 % — ABNORMAL LOW (ref 39.0–52.0)
Hemoglobin: 12.2 g/dL — ABNORMAL LOW (ref 13.0–17.0)
LYMPHS PCT: 17 % (ref 12–46)
Lymphs Abs: 1.1 10*3/uL (ref 0.7–4.0)
MCH: 30.8 pg (ref 26.0–34.0)
MCHC: 32.3 g/dL (ref 30.0–36.0)
MCV: 95.5 fL (ref 78.0–100.0)
MPV: 8.5 fL — AB (ref 8.6–12.4)
Monocytes Absolute: 0.9 10*3/uL (ref 0.1–1.0)
Monocytes Relative: 14 % — ABNORMAL HIGH (ref 3–12)
NEUTROS ABS: 4.2 10*3/uL (ref 1.7–7.7)
NEUTROS PCT: 68 % (ref 43–77)
PLATELETS: 509 10*3/uL — AB (ref 150–400)
RBC: 3.96 MIL/uL — ABNORMAL LOW (ref 4.22–5.81)
RDW: 12.3 % (ref 11.5–15.5)
WBC: 6.2 10*3/uL (ref 4.0–10.5)

## 2014-10-28 LAB — COMPLETE METABOLIC PANEL WITH GFR
ALT: 21 U/L (ref 0–53)
AST: 28 U/L (ref 0–37)
Albumin: 3.8 g/dL (ref 3.5–5.2)
Alkaline Phosphatase: 86 U/L (ref 39–117)
BUN: 9 mg/dL (ref 6–23)
CO2: 25 mEq/L (ref 19–32)
Calcium: 9 mg/dL (ref 8.4–10.5)
Chloride: 104 mEq/L (ref 96–112)
Creat: 0.81 mg/dL (ref 0.50–1.35)
GFR, Est Non African American: >89 mL/min
Glucose, Bld: 74 mg/dL (ref 70–99)
POTASSIUM: 4.1 meq/L (ref 3.5–5.3)
Sodium: 140 mEq/L (ref 135–145)
Total Bilirubin: 0.5 mg/dL (ref 0.2–1.2)
Total Protein: 7.3 g/dL (ref 6.0–8.3)

## 2014-10-28 NOTE — Progress Notes (Signed)
   Subjective:    Patient ID: Stephen Griffin, male    DOB: Jul 28, 1980, 34 y.o.   MRN: [id number]  HPI   34  year old trans gender male with HIV/AIDS generally noncompliant and with  CD4 count in December of 2014 <10, now is being compliant with her VL down below 200 for more than a year,   though not perfectly suppressed or undetectable. CD4 has been above 200 for the past 3 checks from August 2015 thru February 2016. She claims to be adherent with her meds.     Lab Results  Component Value Date   HIV1RNAQUANT 26* 07/28/2014   Lab Results  Component Value Date   CD4TABS 210* 07/28/2014   CD4TABS 250* 03/03/2014   CD4TABS 210* 01/15/2014    We have also switched her to aldactone to maximize anti-androgen therapy along with estrogen injectable for  transgender issues.  She is in good spirits today as well. She requests Marinol which Medicaid does not want to cover at present.   Review of Systems  Constitutional: Negative for chills, diaphoresis, activity change and appetite change.  HENT: Negative for congestion and dental problem.   Eyes: Negative for photophobia.  Respiratory: Negative for cough.   Cardiovascular: Negative for leg swelling.  Gastrointestinal: Negative for nausea, vomiting and diarrhea.  Genitourinary: Negative for dysuria and flank pain.  Musculoskeletal: Negative for back pain and arthralgias.  Neurological: Negative for light-headedness.  Psychiatric/Behavioral: Negative for behavioral problems, confusion, dysphoric mood, decreased concentration and agitation.       Objective:   Physical Exam  Constitutional: He is oriented to person, place, and time. He appears well-developed and well-nourished.  HENT:  Head: Normocephalic and atraumatic.  Eyes: Conjunctivae and EOM are normal.  Neck: Normal range of motion. Neck supple.  Cardiovascular: Normal rate and regular rhythm.   Pulmonary/Chest: Effort normal. No respiratory distress. He has no wheezes.    Abdominal: Soft. He exhibits no distension.  Musculoskeletal: Normal range of motion. He exhibits no edema or tenderness.  Neurological: He is alert and oriented to person, place, and time.  Skin: Skin is warm and dry. No rash noted. No erythema. No pallor.  Psychiatric: He has a normal mood and affect. His speech is normal and behavior is normal.          Assessment & Plan:   HIV: continue Tivicay and Truvada --> Tivicay and Descovy (when available)  Recheck  VL and CD4 today and  rtc in 3 months   Transgender:   -- continue  ALDACTONE at  150 mg daily check BMP  --Continue estradiol to 20 mg q 2  weeks intramuscular     HTN: well controlled 150mg  of aldactone  Anorexia: may not be able to get Marinol if Medicaid will not cover it

## 2014-10-29 ENCOUNTER — Other Ambulatory Visit: Payer: Self-pay | Admitting: Infectious Disease

## 2014-10-29 LAB — RPR

## 2014-10-29 LAB — URINE CYTOLOGY ANCILLARY ONLY
CHLAMYDIA, DNA PROBE: NEGATIVE
NEISSERIA GONORRHEA: NEGATIVE

## 2014-10-30 ENCOUNTER — Telehealth: Payer: Self-pay | Admitting: *Deleted

## 2014-10-30 LAB — T-HELPER CELL (CD4) - (RCID CLINIC ONLY)
CD4 % Helper T Cell: 11 % — ABNORMAL LOW (ref 33–55)
CD4 T Cell Abs: 120 /uL — ABNORMAL LOW (ref 400–2700)

## 2014-10-30 LAB — HIV-1 RNA ULTRAQUANT REFLEX TO GENTYP+
HIV 1 RNA Quant: 1449 copies/mL — ABNORMAL HIGH (ref <20)
HIV-1 RNA Quant, Log: 3.16 {Log} — ABNORMAL HIGH (ref <1.30)

## 2014-10-30 NOTE — Telephone Encounter (Signed)
-----   Message from Truman Hayward, MD sent at 10/30/2014 11:39 AM EDT ----- WE need "Chocolate" to improve her adherence she is CLEARLY not doing taking her meds as she should and she may unfortunately have developed resistance. Szshe need s to be seen by me in 2 weeks

## 2014-10-30 NOTE — Telephone Encounter (Signed)
Patient notified and scheduled for appt on 11/19/14 at 11:30 AM. Myrtis Hopping

## 2014-10-31 ENCOUNTER — Telehealth: Payer: Self-pay | Admitting: *Deleted

## 2014-10-31 NOTE — Telephone Encounter (Signed)
PA needed for Marinol rx.  Electronically completed PA and sent to plan.

## 2014-11-07 LAB — HIV-1 GENOTYPR PLUS

## 2014-11-07 LAB — HIV-1 INTEGRASE GENOTYPE

## 2014-11-17 ENCOUNTER — Other Ambulatory Visit: Payer: Self-pay | Admitting: *Deleted

## 2014-11-17 DIAGNOSIS — IMO0001 Reserved for inherently not codable concepts without codable children: Secondary | ICD-10-CM

## 2014-11-17 DIAGNOSIS — E349 Endocrine disorder, unspecified: Secondary | ICD-10-CM

## 2014-11-17 DIAGNOSIS — F640 Transsexualism: Principal | ICD-10-CM

## 2014-11-19 ENCOUNTER — Ambulatory Visit: Payer: Medicaid Other | Admitting: Infectious Disease

## 2014-11-26 ENCOUNTER — Other Ambulatory Visit: Payer: Self-pay | Admitting: Infectious Disease

## 2014-12-29 ENCOUNTER — Other Ambulatory Visit: Payer: Self-pay | Admitting: Infectious Diseases

## 2015-01-28 ENCOUNTER — Ambulatory Visit: Payer: Medicaid Other | Admitting: Infectious Disease

## 2015-02-04 ENCOUNTER — Other Ambulatory Visit: Payer: Self-pay | Admitting: Infectious Disease

## 2015-02-17 ENCOUNTER — Other Ambulatory Visit: Payer: Medicaid Other

## 2015-02-17 ENCOUNTER — Other Ambulatory Visit (HOSPITAL_COMMUNITY)
Admission: RE | Admit: 2015-02-17 | Discharge: 2015-02-17 | Disposition: A | Discharge status: Home / Self Care | Payer: Medicaid Other | Source: Ambulatory Visit | Attending: Infectious Disease | Admitting: Infectious Disease

## 2015-02-17 DIAGNOSIS — Z113 Encounter for screening for infections with a predominantly sexual mode of transmission: Secondary | ICD-10-CM | POA: Insufficient documentation

## 2015-02-17 DIAGNOSIS — B20 Human immunodeficiency virus [HIV] disease: Secondary | ICD-10-CM

## 2015-02-17 LAB — CBC WITH DIFFERENTIAL/PLATELET
BASOS ABS: 0.1 10*3/uL (ref 0.0–0.1)
Basophils Relative: 1 % (ref 0–1)
EOS PCT: 1 % (ref 0–5)
Eosinophils Absolute: 0.1 10*3/uL (ref 0.0–0.7)
HEMATOCRIT: 41.7 % (ref 39.0–52.0)
Hemoglobin: 13.4 g/dL (ref 13.0–17.0)
LYMPHS ABS: 1.5 10*3/uL (ref 0.7–4.0)
LYMPHS PCT: 19 % (ref 12–46)
MCH: 31.2 pg (ref 26.0–34.0)
MCHC: 32.1 g/dL (ref 30.0–36.0)
MCV: 97.2 fL (ref 78.0–100.0)
MONO ABS: 1 10*3/uL (ref 0.1–1.0)
MPV: 9.1 fL (ref 8.6–12.4)
Monocytes Relative: 13 % — ABNORMAL HIGH (ref 3–12)
Neutro Abs: 5.2 10*3/uL (ref 1.7–7.7)
Neutrophils Relative %: 66 % (ref 43–77)
Platelets: 477 10*3/uL — ABNORMAL HIGH (ref 150–400)
RBC: 4.29 MIL/uL (ref 4.22–5.81)
RDW: 12.4 % (ref 11.5–15.5)
WBC: 7.9 10*3/uL (ref 4.0–10.5)

## 2015-02-17 LAB — COMPLETE METABOLIC PANEL WITH GFR
ALT: 30 U/L (ref 9–46)
AST: 38 U/L (ref 10–40)
Albumin: 4.3 g/dL (ref 3.6–5.1)
Alkaline Phosphatase: 75 U/L (ref 40–115)
BUN: 9 mg/dL (ref 7–25)
CHLORIDE: 101 mmol/L (ref 98–110)
CO2: 25 mmol/L (ref 20–31)
CREATININE: 1.09 mg/dL (ref 0.60–1.35)
Calcium: 9.8 mg/dL (ref 8.6–10.3)
GFR, Est African American: >89 mL/min (ref >=60)
GFR, Est Non African American: 88 mL/min (ref >=60)
Glucose, Bld: 64 mg/dL — ABNORMAL LOW (ref 65–99)
Potassium: 4.8 mmol/L (ref 3.5–5.3)
SODIUM: 136 mmol/L (ref 135–146)
Total Bilirubin: 0.6 mg/dL (ref 0.2–1.2)
Total Protein: 7.7 g/dL (ref 6.1–8.1)

## 2015-02-17 NOTE — Addendum Note (Signed)
Addended by: Reggy Eye on: 02/17/2015 01:49 PM   Modules accepted: Orders

## 2015-02-18 LAB — RPR

## 2015-02-18 LAB — URINE CYTOLOGY ANCILLARY ONLY
CHLAMYDIA, DNA PROBE: NEGATIVE
Neisseria Gonorrhea: NEGATIVE

## 2015-02-18 LAB — T-HELPER CELL (CD4) - (RCID CLINIC ONLY)
CD4 % Helper T Cell: 16 % — ABNORMAL LOW (ref 33–55)
CD4 T CELL ABS: 240 /uL — AB (ref 400–2700)

## 2015-02-19 LAB — HIV-1 RNA QUANT-NO REFLEX-BLD
HIV 1 RNA Quant: <20 copies/mL (ref <20)
HIV-1 RNA Quant, Log: <1.3 {Log} (ref <1.30)

## 2015-03-03 ENCOUNTER — Encounter: Payer: Self-pay | Admitting: Infectious Disease

## 2015-03-03 ENCOUNTER — Ambulatory Visit (INDEPENDENT_AMBULATORY_CARE_PROVIDER_SITE_OTHER): Payer: Medicaid Other | Admitting: Infectious Disease

## 2015-03-03 VITALS — BP 128/86 | HR 83 | Temp 98.3°F | Ht 71.0 in | Wt 137.0 lb

## 2015-03-03 DIAGNOSIS — I10 Essential (primary) hypertension: Secondary | ICD-10-CM | POA: Diagnosis not present

## 2015-03-03 DIAGNOSIS — E349 Endocrine disorder, unspecified: Secondary | ICD-10-CM

## 2015-03-03 DIAGNOSIS — Z720 Tobacco use: Secondary | ICD-10-CM

## 2015-03-03 DIAGNOSIS — F17210 Nicotine dependence, cigarettes, uncomplicated: Secondary | ICD-10-CM

## 2015-03-03 DIAGNOSIS — F640 Transsexualism: Secondary | ICD-10-CM

## 2015-03-03 DIAGNOSIS — Z789 Other specified health status: Secondary | ICD-10-CM

## 2015-03-03 DIAGNOSIS — B20 Human immunodeficiency virus [HIV] disease: Secondary | ICD-10-CM

## 2015-03-03 DIAGNOSIS — Z7251 High risk heterosexual behavior: Secondary | ICD-10-CM | POA: Diagnosis not present

## 2015-03-03 DIAGNOSIS — IMO0001 Reserved for inherently not codable concepts without codable children: Secondary | ICD-10-CM

## 2015-03-03 DIAGNOSIS — Z23 Encounter for immunization: Secondary | ICD-10-CM | POA: Diagnosis not present

## 2015-03-03 MED ORDER — ESTRADIOL VALERATE 20 MG/ML IM OIL: TOPICAL_OIL | INTRAMUSCULAR | Status: DC

## 2015-03-03 MED ORDER — DOLUTEGRAVIR SODIUM 50 MG PO TABS: 50.0000 mg | ORAL_TABLET | Freq: Every day | ORAL | Status: DC

## 2015-03-03 MED ORDER — SPIRONOLACTONE 100 MG PO TABS: 150.0000 mg | ORAL_TABLET | Freq: Every day | ORAL | Status: DC

## 2015-03-03 MED ORDER — DRONABINOL 5 MG PO CAPS: 5.0000 mg | ORAL_CAPSULE | Freq: Two times a day (BID) | ORAL | Status: DC

## 2015-03-03 MED ORDER — DULOXETINE HCL 30 MG PO CPEP: 30.0000 mg | ORAL_CAPSULE | Freq: Every day | ORAL | Status: DC

## 2015-03-03 MED ORDER — EMTRICITABINE-TENOFOVIR AF 200-25 MG PO TABS: 1.0000 | ORAL_TABLET | Freq: Every day | ORAL | Status: DC

## 2015-03-03 MED ORDER — "SYRINGE/NEEDLE (DISP) 23G X 1"" 3 ML MISC": 1.0000 | Status: DC

## 2015-03-03 NOTE — Progress Notes (Signed)
Chief complaint: followup for HIV on meds, transgender identity, HTN, anorexia  Subjective:    Patient ID: Stephen Griffin, male    DOB: June 12, 1980, 34 y.o.   MRN: [id number]  HPI   34  year old trans gender male with HIV/AIDS generally noncompliant and with  CD4 count in December of 2014 <10, had been compliant with her VL down below 200 for more than a year,   though not perfectly suppressed or undetectable. CD4 has been above 200 for the past 3 checks from August 2015 thru February 2016, but since then became viremic in the Spring and CD4 dropped to 120, recently resumed her ARV and VL is undetectable and CD4 back above 200.  She is out of her injectable hormones as I did not renew them when she repeatedly failed to show up for clinic appt and was still viremic.    Lab Results  Component Value Date   HIV1RNAQUANT <20 02/17/2015   Lab Results  Component Value Date   CD4TABS 240* 02/17/2015   CD4TABS 120* 10/28/2014   CD4TABS 210* 07/28/2014    Past Medical History  Diagnosis Date  . HIV (human immunodeficiency virus infection) ([place])   . DVT (deep venous thrombosis) ([place])     "LLE" (12/13/2012)  . Asthma   . Pneumonia     "once" (12/13/2012)  . INOMVEHM(094.7)     "weekly" (12/13/2012)  . Esophageal candidiasis ([place])     [name] 12/13/2012  . 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 Incision and drainage of wound Left 03/2009    index finger  . Incise and drain abcess Left 2008    groin; psoas intraabdominal/notes 09/07/2006 (12/13/2012)  . Ercp N/A 12/14/2012    Procedure: ENDOSCOPIC RETROGRADE CHOLANGIOPANCREATOGRAPHY (ERCP);  Surgeon: Inda Castle, MD;  Location: [facility];  Service: Gastroenterology;  Laterality: N/A;  . Cholecystectomy N/A 12/17/2012    Procedure: LAPAROSCOPIC CHOLECYSTECTOMY WITH INTRAOPERATIVE CHOLANGIOGRAM;  Surgeon: Harl Bowie, MD;  Location:  [facility];  Service: General;  Laterality: N/A;    No family history on file. Mom with HTN   Social History   Social History  . Marital Status: Single    Spouse Name: N/A  . Number of Children: N/A  . Years of Education: N/A   Occupational History  . Disability    Social History Main Topics  . Smoking status: Current Every Day Smoker -- 0.50 packs/day for 14 years    Types: Cigarettes  . Smokeless tobacco: Never Used     Comment: 5 ciggs daily  . Alcohol Use: 28.8 oz/week    48 Cans of beer per week     Comment: 12/13/2012 "2, 25oz beers/week"  . Drug Use: No     Comment: 12/13/2012 "couple joints/day"  . Sexual Activity:    Partners: Male     Comment: pt. given condoms   Other Topics Concern  . None   Social History Narrative   Currently living in [place] with father. Feels safe in the home; gets along with father. Is the youngest of 3 children, other siblings are medically well. Does not work; is on disability.    No Known Allergies   Current outpatient prescriptions:  .  dolutegravir (TIVICAY) 50 MG tablet, Take 1 tablet (50 mg total) by mouth daily., Disp: 30 tablet, Rfl: 11 .  dronabinol (MARINOL) 5 MG capsule,  Take 1 capsule (5 mg total) by mouth 2 (two) times daily before a meal., Disp: 60 capsule, Rfl: 4 .  spironolactone (ALDACTONE) 100 MG tablet, Take 1.5 tablets (150 mg total) by mouth daily., Disp: 45 tablet, Rfl: 11 .  SYRINGE-NEEDLE, DISP, 3 ML (B-D INTEGRA SYRINGE) 23G X 1" 3 ML MISC, Inject 1 Syringe into the muscle every 21 ( twenty-one) days., Disp: 15 each, Rfl: 0 .  DULoxetine (CYMBALTA) 30 MG capsule, Take 1 capsule (30 mg total) by mouth at bedtime., Disp: 30 capsule, Rfl: 11 .  emtricitabine-tenofovir AF (DESCOVY) 200-25 MG tablet, Take 1 tablet by mouth daily., Disp: 30 tablet, Rfl: 11 .  estradiol valerate (DELESTROGEN) 20 MG/ML injection, INJECT 0.5ML (10MG  DOSE) INTRAMUSCULARLY EVERY 14 DAYS, Disp: 5 mL, Rfl: 3     Review of  Systems  Constitutional: Negative for chills, diaphoresis, activity change and appetite change.  HENT: Negative for congestion and dental problem.   Eyes: Negative for photophobia.  Respiratory: Negative for cough.   Cardiovascular: Negative for leg swelling.  Gastrointestinal: Negative for nausea, vomiting and diarrhea.  Genitourinary: Negative for dysuria and flank pain.  Musculoskeletal: Negative for back pain and arthralgias.  Neurological: Negative for light-headedness.  Psychiatric/Behavioral: Negative for behavioral problems, confusion, dysphoric mood, decreased concentration and agitation.       Objective:   Physical Exam  Constitutional: He is oriented to person, place, and time. He appears well-developed and well-nourished.  HENT:  Head: Normocephalic and atraumatic.  Eyes: Conjunctivae and EOM are normal.  Neck: Normal range of motion. Neck supple.  Cardiovascular: Normal rate and regular rhythm.   Pulmonary/Chest: Effort normal. No respiratory distress. He has no wheezes.  Abdominal: Soft. He exhibits no distension.  Musculoskeletal: Normal range of motion. He exhibits no edema or tenderness.  Neurological: He is alert and oriented to person, place, and time.  Skin: Skin is warm and dry. No rash noted. No erythema. No pallor.  Psychiatric: He has a normal mood and affect. His speech is normal and behavior is normal.          Assessment & Plan:   HIV: continue Tivicay and exchange Truvada for Descovy rtc with labs and visit 2 weeks after in 2 months  Transgender:  -- continue  ALDACTONE at  150 mg --renew estradiol to 20 mg q 2  weeks intramuscular     HTN: well controlled 150mg  of aldactone Filed Vitals:   03/03/15 1453  BP: 128/86  Pulse: 83  Temp: 98.3 F (36.8 C)     Anorexia: renewed Marinol  Smoker: counseled to stop smoking esp in light of hormone therapy  I spent greater than 40 minutes with the patient including greater than 50% of time  in face to face counsel of the patient re her HIV, her ARV importance of compliance with ARV, transgender process, HTN, smoking and in coordination of their care.

## 2015-03-06 ENCOUNTER — Other Ambulatory Visit: Payer: Self-pay | Admitting: *Deleted

## 2015-03-06 DIAGNOSIS — F640 Transsexualism: Secondary | ICD-10-CM

## 2015-03-06 DIAGNOSIS — IMO0001 Reserved for inherently not codable concepts without codable children: Secondary | ICD-10-CM

## 2015-03-06 DIAGNOSIS — I10 Essential (primary) hypertension: Secondary | ICD-10-CM

## 2015-03-06 DIAGNOSIS — B20 Human immunodeficiency virus [HIV] disease: Secondary | ICD-10-CM

## 2015-03-06 DIAGNOSIS — E349 Endocrine disorder, unspecified: Secondary | ICD-10-CM

## 2015-03-06 DIAGNOSIS — Z7251 High risk heterosexual behavior: Secondary | ICD-10-CM

## 2015-03-06 MED ORDER — ESTRADIOL VALERATE 20 MG/ML IM OIL: TOPICAL_OIL | INTRAMUSCULAR | Status: DC

## 2015-03-06 MED ORDER — DRONABINOL 5 MG PO CAPS: 5.0000 mg | ORAL_CAPSULE | Freq: Two times a day (BID) | ORAL | Status: DC

## 2015-04-02 ENCOUNTER — Other Ambulatory Visit: Payer: Self-pay | Admitting: Infectious Disease

## 2015-04-16 ENCOUNTER — Other Ambulatory Visit: Payer: Self-pay | Admitting: Infectious Disease

## 2015-04-27 ENCOUNTER — Other Ambulatory Visit: Payer: Self-pay | Admitting: Infectious Disease

## 2015-04-27 DIAGNOSIS — B3781 Candidal esophagitis: Secondary | ICD-10-CM

## 2015-05-14 ENCOUNTER — Other Ambulatory Visit: Payer: Self-pay | Admitting: Infectious Disease

## 2015-05-14 DIAGNOSIS — E349 Endocrine disorder, unspecified: Secondary | ICD-10-CM

## 2015-05-14 DIAGNOSIS — F640 Transsexualism: Principal | ICD-10-CM

## 2015-05-14 DIAGNOSIS — IMO0001 Reserved for inherently not codable concepts without codable children: Secondary | ICD-10-CM

## 2015-07-15 ENCOUNTER — Other Ambulatory Visit: Payer: Self-pay | Admitting: *Deleted

## 2015-07-15 NOTE — Telephone Encounter (Signed)
Checking with Pharmacy to see that pt does have RFs available.  PPA does hver RFs for syringes and Tivicay.

## 2015-08-10 ENCOUNTER — Other Ambulatory Visit: Payer: Medicaid Other

## 2015-08-12 ENCOUNTER — Other Ambulatory Visit: Payer: Medicaid Other

## 2015-08-17 ENCOUNTER — Other Ambulatory Visit: Payer: Medicaid Other

## 2015-08-17 ENCOUNTER — Other Ambulatory Visit (HOSPITAL_COMMUNITY)
Admission: RE | Admit: 2015-08-17 | Discharge: 2015-08-17 | Disposition: A | Discharge status: Home / Self Care | Payer: Medicaid Other | Source: Ambulatory Visit | Attending: Infectious Disease | Admitting: Infectious Disease

## 2015-08-17 DIAGNOSIS — Z113 Encounter for screening for infections with a predominantly sexual mode of transmission: Secondary | ICD-10-CM | POA: Insufficient documentation

## 2015-08-17 DIAGNOSIS — Z79899 Other long term (current) drug therapy: Secondary | ICD-10-CM

## 2015-08-17 DIAGNOSIS — B20 Human immunodeficiency virus [HIV] disease: Secondary | ICD-10-CM

## 2015-08-17 LAB — CBC WITH DIFFERENTIAL/PLATELET
BASOS ABS: 0 10*3/uL (ref 0.0–0.1)
Basophils Relative: 1 % (ref 0–1)
EOS ABS: 0.1 10*3/uL (ref 0.0–0.7)
EOS PCT: 3 % (ref 0–5)
HCT: 37.8 % — ABNORMAL LOW (ref 39.0–52.0)
Hemoglobin: 12.5 g/dL — ABNORMAL LOW (ref 13.0–17.0)
LYMPHS ABS: 1.5 10*3/uL (ref 0.7–4.0)
Lymphocytes Relative: 34 % (ref 12–46)
MCH: 31.8 pg (ref 26.0–34.0)
MCHC: 33.1 g/dL (ref 30.0–36.0)
MCV: 96.2 fL (ref 78.0–100.0)
MONOS PCT: 16 % — AB (ref 3–12)
MPV: 8.8 fL (ref 8.6–12.4)
Monocytes Absolute: 0.7 10*3/uL (ref 0.1–1.0)
Neutro Abs: 2 10*3/uL (ref 1.7–7.7)
Neutrophils Relative %: 46 % (ref 43–77)
PLATELETS: 362 10*3/uL (ref 150–400)
RBC: 3.93 MIL/uL — ABNORMAL LOW (ref 4.22–5.81)
RDW: 12 % (ref 11.5–15.5)
WBC: 4.4 10*3/uL (ref 4.0–10.5)

## 2015-08-17 LAB — COMPLETE METABOLIC PANEL WITH GFR
ALT: 28 U/L (ref 9–46)
AST: 30 U/L (ref 10–40)
Albumin: 3.8 g/dL (ref 3.6–5.1)
Alkaline Phosphatase: 61 U/L (ref 40–115)
BUN: 9 mg/dL (ref 7–25)
CHLORIDE: 103 mmol/L (ref 98–110)
CO2: 27 mmol/L (ref 20–31)
Calcium: 8.8 mg/dL (ref 8.6–10.3)
Creat: 0.91 mg/dL (ref 0.60–1.35)
Glucose, Bld: 71 mg/dL (ref 65–99)
Potassium: 4.4 mmol/L (ref 3.5–5.3)
Sodium: 137 mmol/L (ref 135–146)
Total Bilirubin: 0.4 mg/dL (ref 0.2–1.2)
Total Protein: 7.1 g/dL (ref 6.1–8.1)

## 2015-08-17 LAB — LIPID PANEL
CHOL/HDL RATIO: 2.1 ratio (ref <=5.0)
CHOLESTEROL: 103 mg/dL — AB (ref 125–200)
HDL: 49 mg/dL (ref >=40)
LDL Cholesterol: 33 mg/dL (ref <130)
TRIGLYCERIDES: 105 mg/dL (ref <150)
VLDL: 21 mg/dL (ref <30)

## 2015-08-18 ENCOUNTER — Other Ambulatory Visit: Payer: Self-pay | Admitting: Infectious Disease

## 2015-08-18 LAB — RPR

## 2015-08-18 LAB — URINE CYTOLOGY ANCILLARY ONLY
CHLAMYDIA, DNA PROBE: NEGATIVE
Neisseria Gonorrhea: NEGATIVE

## 2015-08-18 LAB — T-HELPER CELL (CD4) - (RCID CLINIC ONLY)
CD4 T CELL HELPER: 14 % — AB (ref 33–55)
CD4 T Cell Abs: 190 /uL — ABNORMAL LOW (ref 400–2700)

## 2015-08-19 LAB — HIV-1 RNA QUANT-NO REFLEX-BLD
HIV 1 RNA Quant: 32773 copies/mL — ABNORMAL HIGH (ref <20)
HIV-1 RNA Quant, Log: 4.52 Log copies/mL — ABNORMAL HIGH (ref <1.30)

## 2015-08-24 ENCOUNTER — Ambulatory Visit: Payer: Medicaid Other | Admitting: Infectious Disease

## 2015-09-25 ENCOUNTER — Other Ambulatory Visit: Payer: Self-pay | Admitting: Infectious Disease

## 2015-09-30 ENCOUNTER — Other Ambulatory Visit: Payer: Self-pay | Admitting: Infectious Disease

## 2015-11-02 ENCOUNTER — Other Ambulatory Visit: Payer: Self-pay | Admitting: Infectious Disease

## 2015-11-02 DIAGNOSIS — B20 Human immunodeficiency virus [HIV] disease: Secondary | ICD-10-CM

## 2015-11-03 ENCOUNTER — Telehealth: Payer: Self-pay | Admitting: *Deleted

## 2015-11-03 NOTE — Telephone Encounter (Signed)
I would send the VL with reflex to genotype with INSTI, NNRTI, NRTI

## 2015-11-03 NOTE — Telephone Encounter (Signed)
Ordered per your request.  K. Green making sure that the correct lab is ordered.

## 2015-11-03 NOTE — Telephone Encounter (Signed)
Thanks Denise! 

## 2015-11-03 NOTE — Telephone Encounter (Signed)
Per [facility] pt states he doen't take Bactrim and Fluconazole anymore.  RN called the patient to schedule MD and Lab appointments.  Lab appointment is 11/05/15,  Ordered VL, CD4, RPR and urine cytology.  Dr. Tommy Medal please add any additional labs you would like drawn.

## 2015-11-05 ENCOUNTER — Other Ambulatory Visit: Payer: Medicaid Other

## 2015-11-06 ENCOUNTER — Other Ambulatory Visit: Payer: Medicaid Other

## 2015-11-16 ENCOUNTER — Ambulatory Visit: Payer: Medicaid Other | Admitting: Infectious Disease

## 2015-12-08 ENCOUNTER — Other Ambulatory Visit (HOSPITAL_COMMUNITY)
Admission: RE | Admit: 2015-12-08 | Discharge: 2015-12-08 | Disposition: A | Discharge status: Home / Self Care | Payer: Medicaid Other | Source: Ambulatory Visit | Attending: Infectious Disease | Admitting: Infectious Disease

## 2015-12-08 ENCOUNTER — Other Ambulatory Visit: Payer: Self-pay | Admitting: Infectious Disease

## 2015-12-08 ENCOUNTER — Other Ambulatory Visit: Payer: Medicaid Other

## 2015-12-08 DIAGNOSIS — B20 Human immunodeficiency virus [HIV] disease: Secondary | ICD-10-CM

## 2015-12-08 DIAGNOSIS — Z113 Encounter for screening for infections with a predominantly sexual mode of transmission: Secondary | ICD-10-CM

## 2015-12-08 DIAGNOSIS — D126 Benign neoplasm of colon, unspecified: Secondary | ICD-10-CM | POA: Insufficient documentation

## 2015-12-08 LAB — COMPREHENSIVE METABOLIC PANEL
ALBUMIN: 4.2 g/dL (ref 3.6–5.1)
ALK PHOS: 74 U/L (ref 40–115)
ALT: 22 U/L (ref 9–46)
AST: 23 U/L (ref 10–40)
BUN: 15 mg/dL (ref 7–25)
CALCIUM: 9.5 mg/dL (ref 8.6–10.3)
CHLORIDE: 104 mmol/L (ref 98–110)
CO2: 23 mmol/L (ref 20–31)
Creat: 1.07 mg/dL (ref 0.60–1.35)
Glucose, Bld: 92 mg/dL (ref 65–99)
POTASSIUM: 4.3 mmol/L (ref 3.5–5.3)
Sodium: 138 mmol/L (ref 135–146)
TOTAL PROTEIN: 7.3 g/dL (ref 6.1–8.1)
Total Bilirubin: 0.2 mg/dL (ref 0.2–1.2)

## 2015-12-08 LAB — CBC WITH DIFFERENTIAL/PLATELET
BASOS ABS: 0 {cells}/uL (ref 0–200)
Basophils Relative: 0 %
EOS PCT: 2 %
Eosinophils Absolute: 190 cells/uL (ref 15–500)
HCT: 42.8 % (ref 38.5–50.0)
Hemoglobin: 13.7 g/dL (ref 13.2–17.1)
Lymphocytes Relative: 24 %
Lymphs Abs: 2280 cells/uL (ref 850–3900)
MCH: 30.9 pg (ref 27.0–33.0)
MCHC: 32 g/dL (ref 32.0–36.0)
MCV: 96.6 fL (ref 80.0–100.0)
MONOS PCT: 7 %
MPV: 8.9 fL (ref 7.5–12.5)
Monocytes Absolute: 665 cells/uL (ref 200–950)
NEUTROS ABS: 6365 {cells}/uL (ref 1500–7800)
Neutrophils Relative %: 67 %
PLATELETS: 352 10*3/uL (ref 140–400)
RBC: 4.43 MIL/uL (ref 4.20–5.80)
RDW: 12.6 % (ref 11.0–15.0)
WBC: 9.5 10*3/uL (ref 3.8–10.8)

## 2015-12-08 NOTE — Addendum Note (Signed)
Addended by: Dolan Amen D on: 12/08/2015 02:53 PM   Modules accepted: Orders

## 2015-12-09 LAB — T-HELPER CELL (CD4) - (RCID CLINIC ONLY)
CD4 % Helper T Cell: 11 % — ABNORMAL LOW (ref 33–55)
CD4 T CELL ABS: 250 /uL — AB (ref 400–2700)

## 2015-12-09 LAB — URINE CYTOLOGY ANCILLARY ONLY
CHLAMYDIA, DNA PROBE: NEGATIVE
Neisseria Gonorrhea: NEGATIVE

## 2015-12-09 LAB — RPR

## 2015-12-11 LAB — HIV RNA, RTPCR W/R GT (RTI, PI,INT)
HIV 1 RNA QUANT: 56 {copies}/mL — AB
HIV-1 RNA QUANT, LOG: 1.75 {Log_copies}/mL — AB

## 2015-12-28 ENCOUNTER — Ambulatory Visit (INDEPENDENT_AMBULATORY_CARE_PROVIDER_SITE_OTHER): Payer: Medicaid Other | Admitting: Infectious Disease

## 2015-12-28 ENCOUNTER — Encounter: Payer: Self-pay | Admitting: Infectious Disease

## 2015-12-28 VITALS — BP 134/87 | HR 99 | Temp 97.9°F | Wt 142.0 lb

## 2015-12-28 DIAGNOSIS — Z7251 High risk heterosexual behavior: Secondary | ICD-10-CM

## 2015-12-28 DIAGNOSIS — B20 Human immunodeficiency virus [HIV] disease: Secondary | ICD-10-CM

## 2015-12-28 DIAGNOSIS — I10 Essential (primary) hypertension: Secondary | ICD-10-CM | POA: Diagnosis not present

## 2015-12-28 DIAGNOSIS — F640 Transsexualism: Secondary | ICD-10-CM

## 2015-12-28 DIAGNOSIS — E349 Endocrine disorder, unspecified: Secondary | ICD-10-CM

## 2015-12-28 DIAGNOSIS — IMO0001 Reserved for inherently not codable concepts without codable children: Secondary | ICD-10-CM

## 2015-12-28 MED ORDER — "SYRINGE/NEEDLE (DISP) 23G X 1"" 3 ML MISC": 4 refills | Status: DC

## 2015-12-28 MED ORDER — SULFAMETHOXAZOLE-TRIMETHOPRIM 800-160 MG PO TABS: ORAL_TABLET | ORAL | 4 refills | Status: DC

## 2015-12-28 MED ORDER — DULOXETINE HCL 30 MG PO CPEP
30.0000 mg | ORAL_CAPSULE | Freq: Every day | ORAL | 11 refills | Status: DC
Start: 2015-12-28 — End: 2016-05-18

## 2015-12-28 MED ORDER — DOLUTEGRAVIR SODIUM 50 MG PO TABS: 50.0000 mg | ORAL_TABLET | Freq: Every day | ORAL | 4 refills | Status: DC

## 2015-12-28 MED ORDER — SPIRONOLACTONE 100 MG PO TABS: 150.0000 mg | ORAL_TABLET | Freq: Every day | ORAL | 11 refills | Status: DC

## 2015-12-28 MED ORDER — EMTRICITABINE-TENOFOVIR AF 200-25 MG PO TABS: 1.0000 | ORAL_TABLET | Freq: Every day | ORAL | 4 refills | Status: DC

## 2015-12-28 MED ORDER — ESTRADIOL VALERATE 20 MG/ML IM OIL: TOPICAL_OIL | INTRAMUSCULAR | 4 refills | Status: DC

## 2015-12-28 NOTE — Patient Instructions (Signed)
Meet with pharmacy in 2 weeks

## 2015-12-28 NOTE — Progress Notes (Signed)
Chief complaint: followup for HIV on meds, transgender identity,  Subjective:    Patient ID: Stephen Griffin, male    DOB: 17-Dec-1980, 35 y.o.   MRN: [id number]  HPI  "Stephen Griffin" is a 14 year old trans gender male with HIV/AIDS generally noncompliant and with  CD4 count in December of 2014 <10,  We had been able to work with Stephen Griffin and she had been able to take her medications reliable to the to get her virus under control and to get her viral load in fact to be "undetectable at less than 20. Unfortunately her to parents has been imperfect and she has not reliably been engaged in care. Her most recent viral load prior to this month has been in the 30,000 range and her CD4 count to drop again. Fortunately she appears to be back on her medications as her viral load now is at 55 and her CD4 count is climbing back above 200.  She admits to me that she did stop taking her medications in the spring due to a conflict with someone who she was in a relationship with.  She states she does have stable housing and is living with her father who is waiting outside in a car.  Lab Results  Component Value Date   HIV1RNAQUANT 56 (H) 12/08/2015   HIV1RNAQUANT 32,773 (H) 08/17/2015   HIV1RNAQUANT <20 02/17/2015   Lab Results  Component Value Date   CD4TABS 250 (L) 12/08/2015   CD4TABS 190 (L) 08/17/2015   CD4TABS 240 (L) 02/17/2015      Lab Results  Component Value Date   HIV1RNAQUANT 56 (H) 12/08/2015   Lab Results  Component Value Date   CD4TABS 250 (L) 12/08/2015   CD4TABS 190 (L) 08/17/2015   CD4TABS 240 (L) 02/17/2015    Past Medical History:  Diagnosis Date  . 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 Asthma   . DVT (deep venous thrombosis) ([place])    "LLE" (12/13/2012)  . Esophageal candidiasis ([place])    [name] 12/13/2012  . [id number])    "weekly" (12/13/2012)  . HIV (human immunodeficiency virus infection) ([place])   . Pneumonia    "once" (12/13/2012)    Past Surgical History:    Procedure Laterality Date  . 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 CHOLECYSTECTOMY N/A 12/17/2012   Procedure: LAPAROSCOPIC CHOLECYSTECTOMY WITH INTRAOPERATIVE CHOLANGIOGRAM;  Surgeon: Harl Bowie, MD;  Location: [facility];  Service: General;  Laterality: N/A;  . ERCP N/A 12/14/2012   Procedure: ENDOSCOPIC RETROGRADE CHOLANGIOPANCREATOGRAPHY (ERCP);  Surgeon: Inda Castle, MD;  Location: [facility];  Service: Gastroenterology;  Laterality: N/A;  . INCISE AND DRAIN ABCESS Left 2008   groin; psoas intraabdominal/notes 09/07/2006 (12/13/2012)  . INCISION AND DRAINAGE OF WOUND Left 03/2009   index finger    Family History  Problem Relation Age of Onset  . Hypertension Mother    Mom with HTN   Social History   Social History  . Marital status: Single    Spouse name: N/A  . Number of children: N/A  . Years of education: N/A   Occupational History  . Disability Unemployed   Social History Main Topics  . Smoking status: Current Every Day Smoker    Packs/day: 0.50    Years: 14.00    Types: Cigarettes  . Smokeless tobacco: Never Used     Comment: 5 ciggs daily  . Alcohol use 28.8 oz/week    48 Cans of beer per week  Comment: 12/13/2012 "2, 25oz beers/week"  . Drug use: No     Comment: 12/13/2012 "couple joints/day"  . Sexual activity: Yes    Partners: Male     Comment: pt. given condoms   Other Topics Concern  . None   Social History Narrative   Currently living in [place] with father. Feels safe in the home; gets along with father. Is the youngest of 3 children, other siblings are medically well. Does not work; is on disability.    No Known Allergies   Current Outpatient Prescriptions:  .  dolutegravir (TIVICAY) 50 MG tablet, Take 1 tablet (50 mg total) by mouth daily., Disp: 30 tablet, Rfl: 4 .  dronabinol (MARINOL) 5 MG capsule, Take 1 capsule (5 mg total) by mouth 2 (two) times daily before a meal., Disp: 60 capsule, Rfl: 4 .  DULoxetine  (CYMBALTA) 30 MG capsule, Take 1 capsule (30 mg total) by mouth at bedtime., Disp: 30 capsule, Rfl: 11 .  emtricitabine-tenofovir AF (DESCOVY) 200-25 MG tablet, Take 1 tablet by mouth daily., Disp: 30 tablet, Rfl: 4 .  estradiol valerate (DELESTROGEN) 20 MG/ML injection, INJECT 0.5ML (10MG  DOSE) INTRAMUSCULARLY EVERY 14 DAYS, Disp: 5 mL, Rfl: 4 .  fluconazole (DIFLUCAN) 100 MG tablet, TAKE 1 TABLET BY MOUTH EVERY THURSDAY, Disp: 15 tablet, Rfl: 3 .  spironolactone (ALDACTONE) 100 MG tablet, Take 1.5 tablets (150 mg total) by mouth daily., Disp: 45 tablet, Rfl: 11 .  sulfamethoxazole-trimethoprim (BACTRIM DS,SEPTRA DS) 800-160 MG tablet, TAKE 1 TABLET BY MOUTH EVERY MONDAY, WEDNESDAY AND FRIDAY, Disp: 15 tablet, Rfl: 4 .  SYRINGE-NEEDLE, DISP, 3 ML (B-D INTEGRA SYRINGE) 23G X 1" 3 ML MISC, USE 1 SYRINGE EVERY 14 DAYS AS DIRECTED, Disp: 100 each, Rfl: 4     Review of Systems  Constitutional: Negative for activity change, appetite change, chills and diaphoresis.  HENT: Negative for congestion and dental problem.   Eyes: Negative for photophobia.  Respiratory: Negative for cough.   Cardiovascular: Negative for leg swelling.  Gastrointestinal: Negative for diarrhea, nausea and vomiting.  Genitourinary: Negative for dysuria and flank pain.  Musculoskeletal: Negative for arthralgias and back pain.  Neurological: Negative for light-headedness.  Psychiatric/Behavioral: Negative for agitation, behavioral problems, confusion, decreased concentration and dysphoric mood.       Objective:   Physical Exam  Constitutional: He is oriented to person, place, and time. He appears well-developed and well-nourished.  HENT:  Head: Normocephalic and atraumatic.  Eyes: Conjunctivae and EOM are normal.  Neck: Normal range of motion. Neck supple.  Cardiovascular: Normal rate and regular rhythm.   Pulmonary/Chest: Effort normal. No respiratory distress. He has no wheezes.  Abdominal: Soft. He exhibits no  distension.  Musculoskeletal: Normal range of motion. He exhibits no edema or tenderness.  Neurological: He is alert and oriented to person, place, and time.  Skin: Skin is warm and dry. No rash noted. No erythema. No pallor.  Psychiatric: He has a normal mood and affect. His speech is normal and behavior is normal.          Assessment & Plan:   HIV: continue Tivicay and Descovy, Bactrim for PCP prophylaxis  We will change her medications over to [facility] outpatient pharmacy so he could track her adherence to medications and pickup of medications more reliably.  I will have her, and see pharmacy in a few weeks and see me in one month's time.  Transgender:  -- continue  ALDACTONE at  150 mg --renew estradiol to 20 mg q  2  weeks intramuscular with a few refills    HTN: 150mg  of aldactone Vitals:   12/28/15 1546  BP: 134/87  Pulse: 99  Temp: 97.9 F (36.6 C)   I spent greater than 40 minutes with the patient including greater than 50% of time in face to face counsel of the patient re her HIV, her ARV importance of compliance with ARV, transgender process, HTN, and in coordination of hercare.

## 2015-12-28 NOTE — Progress Notes (Signed)
Patient ID: Stephen Griffin, male   DOB: [birth year], 35 y.o.   MRN: [id number] HPI: Stephen Griffin is a 35 y.o. male who is here for her HIV f/u.   Allergies: No Known Allergies  Vitals: Temp: 97.9 F (36.6 C) (07/31 1546) Temp Source: Oral (07/31 1546) BP: 134/87 (07/31 1546) Pulse Rate: 99 (07/31 1546)  Past Medical History: Past Medical History:  Diagnosis Date  . 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 Asthma   . DVT (deep venous thrombosis) ([place])    "LLE" (12/13/2012)  . Esophageal candidiasis ([place])    [name] 12/13/2012  . [id number])    "weekly" (12/13/2012)  . HIV (human immunodeficiency virus infection) ([place])   . Pneumonia    "once" (12/13/2012)    Social History: Social History   Social History  . Marital status: Single    Spouse name: N/A  . Number of children: N/A  . Years of education: N/A   Occupational History  . Disability Unemployed   Social History Main Topics  . Smoking status: Current Every Day Smoker    Packs/day: 0.50    Years: 14.00    Types: Cigarettes  . Smokeless tobacco: Never Used     Comment: 5 ciggs daily  . Alcohol use 28.8 oz/week    48 Cans of beer per week     Comment: 12/13/2012 "2, 25oz beers/week"  . Drug use: No     Comment: 12/13/2012 "couple joints/day"  . Sexual activity: Yes    Partners: Male     Comment: pt. given condoms   Other Topics Concern  . None   Social History Narrative   Currently living in [place] with father. Feels safe in the home; gets along with father. Is the youngest of 3 children, other siblings are medically well. Does not work; is on disability.    Previous Regimen:  Current Regimen: DTG/Desc  Labs: HIV 1 RNA Quant (copies/mL)  Date Value  12/08/2015 56 (H)  08/17/2015 32,773 (H)  02/17/2015 <20   CD4 T Cell Abs (/uL)  Date Value  12/08/2015 250 (L)  08/17/2015 190 (L)  02/17/2015 240 (L)   Hep B S Ab (no units)  Date Value  07/24/2006 NO   Hepatitis B Surface Ag (no  units)  Date Value  12/13/2012 NEGATIVE   HCV Ab (no units)  Date Value  12/13/2012 NEGATIVE    CrCl: CrCl cannot be calculated (Unknown ideal weight.).  Lipids:    Component Value Date/Time   CHOL 103 (L) 08/17/2015 1143   TRIG 105 08/17/2015 1143   HDL 49 08/17/2015 1143   CHOLHDL 2.1 08/17/2015 1143   VLDL 21 08/17/2015 1143   LDLCALC 33 08/17/2015 1143    Assessment: She is doing relatively well on her current regimen. Her VL has come down to 56. Discussed compliance with her. We transferred most of her meds to the outpt pharmacy so we can keep track of refills.  Recommendations:  Cont current regimen  Wilfred Lacy, PharmD Clinical Infectious Disease Pharmacist [facility] 12/28/2015, 10:51 PM

## 2016-02-10 ENCOUNTER — Ambulatory Visit: Payer: Medicaid Other | Admitting: Infectious Disease

## 2016-02-18 ENCOUNTER — Ambulatory Visit: Payer: Medicaid Other | Admitting: Infectious Disease

## 2016-03-16 ENCOUNTER — Other Ambulatory Visit: Payer: Self-pay | Admitting: Infectious Disease

## 2016-03-16 ENCOUNTER — Other Ambulatory Visit: Payer: Self-pay | Admitting: Pharmacist

## 2016-03-16 DIAGNOSIS — IMO0001 Reserved for inherently not codable concepts without codable children: Secondary | ICD-10-CM

## 2016-03-16 DIAGNOSIS — E349 Endocrine disorder, unspecified: Secondary | ICD-10-CM

## 2016-03-16 DIAGNOSIS — B20 Human immunodeficiency virus [HIV] disease: Secondary | ICD-10-CM

## 2016-03-16 DIAGNOSIS — I10 Essential (primary) hypertension: Secondary | ICD-10-CM

## 2016-03-16 DIAGNOSIS — F640 Transsexualism: Secondary | ICD-10-CM

## 2016-03-16 DIAGNOSIS — Z7251 High risk heterosexual behavior: Secondary | ICD-10-CM

## 2016-03-16 MED ORDER — "SYRINGE 18G X 1-1/2"" 3 ML MISC": 1.0000 | 2 refills | Status: DC

## 2016-03-21 ENCOUNTER — Ambulatory Visit: Payer: Medicaid Other | Admitting: Infectious Disease

## 2016-03-21 ENCOUNTER — Other Ambulatory Visit: Payer: Self-pay | Admitting: Infectious Disease

## 2016-03-21 DIAGNOSIS — I10 Essential (primary) hypertension: Secondary | ICD-10-CM

## 2016-03-21 DIAGNOSIS — B20 Human immunodeficiency virus [HIV] disease: Secondary | ICD-10-CM

## 2016-03-21 DIAGNOSIS — Z7251 High risk heterosexual behavior: Secondary | ICD-10-CM

## 2016-05-18 ENCOUNTER — Other Ambulatory Visit: Payer: Self-pay | Admitting: *Deleted

## 2016-05-18 DIAGNOSIS — IMO0001 Reserved for inherently not codable concepts without codable children: Secondary | ICD-10-CM

## 2016-05-18 DIAGNOSIS — F640 Transsexualism: Secondary | ICD-10-CM

## 2016-05-18 DIAGNOSIS — B20 Human immunodeficiency virus [HIV] disease: Secondary | ICD-10-CM

## 2016-05-18 DIAGNOSIS — E349 Endocrine disorder, unspecified: Secondary | ICD-10-CM

## 2016-05-18 MED ORDER — DOLUTEGRAVIR SODIUM 50 MG PO TABS: 50.0000 mg | ORAL_TABLET | Freq: Every day | ORAL | 2 refills | Status: DC

## 2016-05-18 MED ORDER — EMTRICITABINE-TENOFOVIR AF 200-25 MG PO TABS: 1.0000 | ORAL_TABLET | Freq: Every day | ORAL | 2 refills | Status: DC

## 2016-05-18 MED ORDER — DULOXETINE HCL 30 MG PO CPEP: 30.0000 mg | ORAL_CAPSULE | Freq: Every day | ORAL | 2 refills | Status: DC

## 2016-05-18 MED ORDER — SULFAMETHOXAZOLE-TRIMETHOPRIM 800-160 MG PO TABS: ORAL_TABLET | ORAL | 1 refills | Status: DC

## 2016-05-18 MED ORDER — ESTRADIOL VALERATE 20 MG/ML IM OIL: TOPICAL_OIL | INTRAMUSCULAR | 0 refills | Status: DC

## 2016-05-25 ENCOUNTER — Other Ambulatory Visit: Payer: Medicaid Other

## 2016-05-25 ENCOUNTER — Other Ambulatory Visit (HOSPITAL_COMMUNITY)
Admission: RE | Admit: 2016-05-25 | Discharge: 2016-05-25 | Disposition: A | Discharge status: Home / Self Care | Payer: Medicaid Other | Source: Ambulatory Visit | Attending: Infectious Disease | Admitting: Infectious Disease

## 2016-05-25 DIAGNOSIS — Z113 Encounter for screening for infections with a predominantly sexual mode of transmission: Secondary | ICD-10-CM | POA: Insufficient documentation

## 2016-05-25 DIAGNOSIS — B20 Human immunodeficiency virus [HIV] disease: Secondary | ICD-10-CM

## 2016-05-25 LAB — COMPREHENSIVE METABOLIC PANEL
ALK PHOS: 90 U/L (ref 40–115)
ALT: 45 U/L (ref 9–46)
AST: 39 U/L (ref 10–40)
Albumin: 4.2 g/dL (ref 3.6–5.1)
BUN: 8 mg/dL (ref 7–25)
CALCIUM: 9.8 mg/dL (ref 8.6–10.3)
CHLORIDE: 103 mmol/L (ref 98–110)
CO2: 28 mmol/L (ref 20–31)
Creat: 1.33 mg/dL (ref 0.60–1.35)
Glucose, Bld: 92 mg/dL (ref 65–99)
POTASSIUM: 4.3 mmol/L (ref 3.5–5.3)
Sodium: 138 mmol/L (ref 135–146)
TOTAL PROTEIN: 7.6 g/dL (ref 6.1–8.1)
Total Bilirubin: 0.5 mg/dL (ref 0.2–1.2)

## 2016-05-25 LAB — CBC WITH DIFFERENTIAL/PLATELET
BASOS PCT: 1 %
Basophils Absolute: 48 cells/uL (ref 0–200)
EOS PCT: 2 %
Eosinophils Absolute: 96 cells/uL (ref 15–500)
HCT: 44.8 % (ref 38.5–50.0)
HEMOGLOBIN: 14.5 g/dL (ref 13.2–17.1)
LYMPHS ABS: 1680 {cells}/uL (ref 850–3900)
Lymphocytes Relative: 35 %
MCH: 31.3 pg (ref 27.0–33.0)
MCHC: 32.4 g/dL (ref 32.0–36.0)
MCV: 96.8 fL (ref 80.0–100.0)
MONO ABS: 624 {cells}/uL (ref 200–950)
MPV: 9.1 fL (ref 7.5–12.5)
Monocytes Relative: 13 %
NEUTROS ABS: 2352 {cells}/uL (ref 1500–7800)
NEUTROS PCT: 49 %
Platelets: 403 10*3/uL — ABNORMAL HIGH (ref 140–400)
RBC: 4.63 MIL/uL (ref 4.20–5.80)
RDW: 12.1 % (ref 11.0–15.0)
WBC: 4.8 10*3/uL (ref 3.8–10.8)

## 2016-05-26 LAB — RPR

## 2016-05-27 LAB — T-HELPER CELL (CD4) - (RCID CLINIC ONLY)
CD4 T CELL ABS: 280 /uL — AB (ref 400–2700)
CD4 T CELL HELPER: 15 % — AB (ref 33–55)

## 2016-05-27 LAB — URINE CYTOLOGY ANCILLARY ONLY
Chlamydia: NEGATIVE
Neisseria Gonorrhea: NEGATIVE

## 2016-05-28 LAB — HIV RNA, RTPCR W/R GT (RTI, PI,INT)
HIV-1 RNA, QN PCR: 1.72 Log copies/mL — ABNORMAL HIGH
HIV-1 RNA, QN PCR: 52 {copies}/mL — AB

## 2016-06-10 ENCOUNTER — Other Ambulatory Visit: Payer: Self-pay | Admitting: Infectious Disease

## 2016-06-10 DIAGNOSIS — B20 Human immunodeficiency virus [HIV] disease: Secondary | ICD-10-CM

## 2016-06-10 DIAGNOSIS — IMO0001 Reserved for inherently not codable concepts without codable children: Secondary | ICD-10-CM

## 2016-06-10 DIAGNOSIS — F640 Transsexualism: Secondary | ICD-10-CM

## 2016-06-10 DIAGNOSIS — E349 Endocrine disorder, unspecified: Secondary | ICD-10-CM

## 2016-06-20 ENCOUNTER — Other Ambulatory Visit: Payer: Self-pay | Admitting: Infectious Disease

## 2016-06-20 DIAGNOSIS — B20 Human immunodeficiency virus [HIV] disease: Secondary | ICD-10-CM

## 2016-07-07 ENCOUNTER — Other Ambulatory Visit: Payer: Self-pay | Admitting: Infectious Disease

## 2016-07-07 DIAGNOSIS — B20 Human immunodeficiency virus [HIV] disease: Secondary | ICD-10-CM

## 2016-07-07 DIAGNOSIS — E349 Endocrine disorder, unspecified: Secondary | ICD-10-CM

## 2016-07-07 DIAGNOSIS — IMO0001 Reserved for inherently not codable concepts without codable children: Secondary | ICD-10-CM

## 2016-07-07 DIAGNOSIS — F640 Transsexualism: Secondary | ICD-10-CM

## 2016-08-18 ENCOUNTER — Other Ambulatory Visit: Payer: Self-pay | Admitting: Infectious Disease

## 2016-08-18 DIAGNOSIS — F640 Transsexualism: Secondary | ICD-10-CM

## 2016-08-18 DIAGNOSIS — E349 Endocrine disorder, unspecified: Secondary | ICD-10-CM

## 2016-08-18 DIAGNOSIS — IMO0001 Reserved for inherently not codable concepts without codable children: Secondary | ICD-10-CM

## 2016-08-18 DIAGNOSIS — B20 Human immunodeficiency virus [HIV] disease: Secondary | ICD-10-CM

## 2016-08-31 ENCOUNTER — Other Ambulatory Visit: Payer: Medicaid Other

## 2016-09-14 ENCOUNTER — Ambulatory Visit: Payer: Medicaid Other | Admitting: Infectious Disease

## 2016-10-17 ENCOUNTER — Encounter: Payer: Self-pay | Admitting: Infectious Disease

## 2016-11-22 ENCOUNTER — Other Ambulatory Visit: Payer: Medicaid Other

## 2016-11-26 ENCOUNTER — Emergency Department (HOSPITAL_COMMUNITY)
Admission: EM | Admit: 2016-11-26 | Discharge: 2016-11-26 | Disposition: A | Discharge status: Home / Self Care | Payer: Medicaid Other | Attending: Emergency Medicine | Admitting: Emergency Medicine

## 2016-11-26 DIAGNOSIS — Z79899 Other long term (current) drug therapy: Secondary | ICD-10-CM | POA: Diagnosis not present

## 2016-11-26 DIAGNOSIS — I10 Essential (primary) hypertension: Secondary | ICD-10-CM | POA: Diagnosis not present

## 2016-11-26 DIAGNOSIS — B20 Human immunodeficiency virus [HIV] disease: Secondary | ICD-10-CM | POA: Insufficient documentation

## 2016-11-26 DIAGNOSIS — R55 Syncope and collapse: Secondary | ICD-10-CM | POA: Diagnosis not present

## 2016-11-26 DIAGNOSIS — J45909 Unspecified asthma, uncomplicated: Secondary | ICD-10-CM | POA: Diagnosis not present

## 2016-11-26 DIAGNOSIS — Z86718 Personal history of other venous thrombosis and embolism: Secondary | ICD-10-CM | POA: Insufficient documentation

## 2016-11-26 DIAGNOSIS — F17210 Nicotine dependence, cigarettes, uncomplicated: Secondary | ICD-10-CM | POA: Diagnosis not present

## 2016-11-26 LAB — SALICYLATE LEVEL

## 2016-11-26 LAB — BASIC METABOLIC PANEL
ANION GAP: 11 (ref 5–15)
BUN: 5 mg/dL — ABNORMAL LOW (ref 6–20)
CALCIUM: 8.9 mg/dL (ref 8.9–10.3)
CO2: 21 mmol/L — ABNORMAL LOW (ref 22–32)
Chloride: 108 mmol/L (ref 101–111)
Creatinine, Ser: 1.03 mg/dL (ref 0.61–1.24)
GLUCOSE: 83 mg/dL (ref 65–99)
POTASSIUM: 3.9 mmol/L (ref 3.5–5.1)
SODIUM: 140 mmol/L (ref 135–145)

## 2016-11-26 LAB — CBC
HCT: 46.3 % (ref 39.0–52.0)
HEMOGLOBIN: 15.3 g/dL (ref 13.0–17.0)
MCH: 32.1 pg (ref 26.0–34.0)
MCHC: 33 g/dL (ref 30.0–36.0)
MCV: 97.1 fL (ref 78.0–100.0)
Platelets: 330 10*3/uL (ref 150–400)
RBC: 4.77 MIL/uL (ref 4.22–5.81)
RDW: 12.7 % (ref 11.5–15.5)
WBC: 6.3 10*3/uL (ref 4.0–10.5)

## 2016-11-26 LAB — ACETAMINOPHEN LEVEL

## 2016-11-26 LAB — ETHANOL: ALCOHOL ETHYL (B): 99 mg/dL — AB (ref <5)

## 2016-11-26 MED ORDER — SODIUM CHLORIDE 0.9 % IV BOLUS (SEPSIS)
1000.0000 mL | Freq: Once | INTRAVENOUS | Status: AC
  Administered 2016-11-26: 1000 mL via INTRAVENOUS

## 2016-11-26 MED ORDER — NALOXONE HCL 0.4 MG/ML IJ SOLN
0.4000 mg | Freq: Once | INTRAMUSCULAR | Status: AC
  Administered 2016-11-26: 0.4 mg via INTRAVENOUS
  Filled 2016-11-26: qty 1

## 2016-11-26 NOTE — ED Notes (Signed)
Pt ambulated down the hall way and was not dizzy.

## 2016-11-26 NOTE — ED Provider Notes (Signed)
[facility] DEPT Provider Note   CSN: [id number] Arrival date & time: 11/26/16  1205     History   Chief Complaint Chief Complaint  Patient presents with  . Loss of Consciousness    HPI Stephen Griffin is a 36 y.o. male.  64 year old M who identifies as F w/ h/o HIV, DVT, asthma presents with syncope. EMS picked the patient up from home where patient reportedly was walking out of the bedroom and passed out. Patient told EMS that she did not hit her head or have any neck or back pain, she did endorse weakness. Family reported drug use to EMS. The patient admitted to alcohol use but denied any other drug use to me. She reported generalized abdominal pain. She cannot remember details of the event.  LEVEL 5 CAVEAT DUE TO AMS   The history is provided by the EMS personnel.  Loss of Consciousness      Past Medical History:  Diagnosis Date  . 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 Asthma   . DVT (deep venous thrombosis) ([place])    "LLE" (12/13/2012)  . Esophageal candidiasis ([place])    [name] 12/13/2012  . XAJOINOM(767.2)    "weekly" (12/13/2012)  . HIV (human immunodeficiency virus infection) ([place])   . Pneumonia    "once" (12/13/2012)    Patient Active Problem List   Diagnosis Date Noted  . HTN (hypertension) 07/28/2014  . 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 Acute biliary pancreatitis 12/15/2012  . AIDS ([place]) 12/15/2012  . Calculus of bile duct without mention of cholecystitis or obstruction 12/14/2012  . Nicotine dependence 12/13/2012  . Abdominal pain 12/13/2012  . Polysubstance abuse 12/13/2012  . Cigarette smoker 12/13/2012  . Alcohol abuse 11/21/2012  . Pancreatitis 11/21/2012  . Elevated alkaline phosphatase level 11/21/2012  . Transaminitis 11/21/2012  . Leukopenia 11/21/2012  . Hypokalemia 11/21/2012  . BRBPR (bright red blood per rectum) 04/11/2012  . MOLLUSCUM CONTAGIOSUM 06/24/2010  . PERIODONTAL DISEASE 05/10/2010  . INSOMNIA  UNSPECIFIED 05/10/2010  . HYPOKALEMIA, MILD 03/18/2010  . Esophageal candidiasis ([place]) 03/08/2010  . BLURRED VISION 03/08/2010  . NIGHT SWEATS 03/08/2010  . COUGH 03/08/2010  . WASTING SYNDROME 03/08/2010  . 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 DENTAL CARIES 09/17/2008  . CHEST PAIN, ATYPICAL 09/17/2008  . PERIPHERAL NEUROPATHY 04/09/2008  . CELLULITIS/ABSCESS NOS 06/27/2006  . Human immunodeficiency virus (HIV) disease ([place]) 03/22/2006  . PNEUMOCYSTIS PNEUMONIA 03/22/2006  . ASTHMA 03/22/2006    Past Surgical History:  Procedure Laterality Date  . 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 CHOLECYSTECTOMY N/A 12/17/2012   Procedure: LAPAROSCOPIC CHOLECYSTECTOMY WITH INTRAOPERATIVE CHOLANGIOGRAM;  Surgeon: Harl Bowie, MD;  Location: [facility];  Service: General;  Laterality: N/A;  . ERCP N/A 12/14/2012   Procedure: ENDOSCOPIC RETROGRADE CHOLANGIOPANCREATOGRAPHY (ERCP);  Surgeon: Inda Castle, MD;  Location: [facility];  Service: Gastroenterology;  Laterality: N/A;  . INCISE AND DRAIN ABCESS Left 2008   groin; psoas intraabdominal/notes 09/07/2006 (12/13/2012)  . INCISION AND DRAINAGE OF WOUND Left 03/2009   index finger       Home Medications    Prior to Admission medications   Medication Sig Start Date End Date Taking? Authorizing Provider  DESCOVY 200-25 MG tablet TAKE 1 TABLET BY MOUTH DAILY **NEEDS APPOINTMENT** 08/18/16  Yes Tommy Medal, Lavell Islam, MD  dronabinol (MARINOL) 5 MG capsule Take 1 capsule (5 mg total) by mouth 2 (two) times daily before a meal.  03/06/15  Yes Tommy Medal, Lavell Islam, MD  DULoxetine (CYMBALTA) 30 MG capsule TAKE 1 CAPSULE BY MOUTH AT BEDTIME **NEEDS APPOINTMENT** 08/18/16  Yes Tommy Medal, Lavell Islam, MD  estradiol valerate (DELESTROGEN) 20 MG/ML injection INJECT 0.5ML (10MG  DOSE) INTRAMUSCULARLY EVERY 14 DAYS 05/18/16  Yes Tommy Medal, Lavell Islam, MD  fluconazole (DIFLUCAN) 100 MG tablet TAKE 1  TABLET BY MOUTH EVERY THURSDAY 04/28/15  Yes Tommy Medal, Lavell Islam, MD  spironolactone (ALDACTONE) 100 MG tablet TAKE 1.5 TABLETS BY MOUTH EVERY DAY 03/21/16  Yes Tommy Medal, Lavell Islam, MD  sulfamethoxazole-trimethoprim (BACTRIM DS,SEPTRA DS) 800-160 MG tablet TAKE 1 TABLET BY MOUTH EVERY Adelfa Koh, [facility] AND FRIDAY 05/18/16  Yes Tommy Medal, Lavell Islam, MD  TIVICAY 50 MG tablet TAKE 1 TABLET BY MOUTH DAILY **MUST BE SEEN FOR FUTURE FILLS** 06/20/16  Yes Tommy Medal, Lavell Islam, MD  Syringe/Needle, Disp, (SYRINGE 3CC/18GX1-1/2") 18G X 1-1/2" 3 ML MISC Inject 1 Syringe as directed every 14 (fourteen) days. 03/16/16   Truman Hayward, MD    Family History Family History  Problem Relation Age of Onset  . Hypertension Mother     Social History Social History  Substance Use Topics  . Smoking status: Current Every Day Smoker    Packs/day: 0.50    Years: 14.00    Types: Cigarettes  . Smokeless tobacco: Never Used     Comment: 5 ciggs daily  . Alcohol use 28.8 oz/week    48 Cans of beer per week     Comment: 12/13/2012 "2, 25oz beers/week"     Allergies   Patient has no known allergies.   Review of Systems Review of Systems  Unable to perform ROS: Mental status change  Cardiovascular: Positive for syncope.     Physical Exam Updated Vital Signs BP (!) 134/92 (BP Location: Right Arm)   Pulse 88   Resp (!) 22   Wt 64.4 kg (142 lb)   SpO2 99%   BMI 19.80 kg/m   Physical Exam  Constitutional: He appears well-developed. No distress.  Thin, asleep  HENT:  Head: Normocephalic and atraumatic.  Eyes: Conjunctivae are normal. Pupils are equal, round, and reactive to light.  Neck: Neck supple.  Cardiovascular: Normal rate, regular rhythm and normal heart sounds.   No murmur heard. Pulmonary/Chest: Effort normal and breath sounds normal.  Abdominal: Soft. Bowel sounds are normal. He exhibits no distension. There is no tenderness.  Musculoskeletal: He exhibits no edema.    Neurological:  Altered, opens eyes to voice but quickly falls asleep, moving all 4 extremities equally  Skin: Skin is warm and dry.  Nursing note and vitals reviewed.    ED Treatments / Results  Labs (all labs ordered are listed, but only abnormal results are displayed) Labs Reviewed  BASIC METABOLIC PANEL - Abnormal; Notable for the following:       Result Value   CO2 21 (*)    BUN 5 (*)    All other components within normal limits  ACETAMINOPHEN LEVEL - Abnormal; Notable for the following:    Acetaminophen (Tylenol), Serum <10 (*)    All other components within normal limits  ETHANOL - Abnormal; Notable for the following:    Alcohol, Ethyl (B) 99 (*)    All other components within normal limits  CBC  SALICYLATE LEVEL  URINALYSIS, ROUTINE W REFLEX MICROSCOPIC  RAPID URINE DRUG SCREEN, HOSP PERFORMED  CBG MONITORING, ED    EKG  EKG Interpretation  Date/Time:  Saturday November 26 2016  16:22:22 EDT Ventricular Rate:  87 PR Interval:    QRS Duration: 83 QT Interval:  351 QTC Calculation: 423 R Axis:   100 Text Interpretation:  Sinus rhythm Right axis deviation EKG similar to previous, apparent T wave abnormalities on previous tracing now appear normal. Confirmed by Theotis Burrow [phone]) on 11/26/2016 4:27:14 PM       Radiology No results found.  Procedures Procedures (including critical care time)  Medications Ordered in ED Medications  sodium chloride 0.9 % bolus 1,000 mL (0 mLs Intravenous Stopped 11/26/16 1609)  naloxone (NARCAN) injection 0.4 mg (0.4 mg Intravenous Given 11/26/16 1313)     Initial Impression / Assessment and Plan / ED Course  I have reviewed the triage vital signs and the nursing notes.  Pertinent labs & imaging results that were available during my care of the patient were reviewed by me and considered in my medical decision making (see chart for details).     Pt brought in from home after syncopal episode, family reports drug use. VS  stable on arrival, sleepy but protecting airway. Gave IVF bolus and obtained labs.  Initial EKG had some irregularities of T waves in I and aVL; repeat was normal in appearance and may have been artifact.  Labwork overall reassuring with normal CBC and BMP, ethanol level 99, negative Tylenol and salicylate.After several hours of observation, the patient was more alert and able to answer questions on reexamination. She has not provided a urine sample but I do suspect some substance ingestion based on clinical picture when she initially arrived. She states that she was in her usual state of health yesterday and this morning with no vomiting, diarrhea, or recent infectious symptoms. She denies any current sx including no CP or SOB. Her workup including labs and EKG are reassuring here and I feel she is appropriate for discharge. Nurse will ambulate patient to ensure no lightheadedness/ongoing sx, and I anticipate discharge if no issues.  Final Clinical Impressions(s) / ED Diagnoses   Final diagnoses:  Syncope and collapse    New Prescriptions New Prescriptions   No medications on file     Little, Wenda Overland, MD 11/26/16 [phone]

## 2016-11-26 NOTE — ED Triage Notes (Signed)
Pt. Coming from home via [facility] for syncope. Pt. Walking out of bedroom and passed out. She denies hitting her head or neck/back pain. Pt. Does endorse weakness. EKG unremarkable per EMS. Pt. Aox4. Family reports drug use. Pt. Male, but identifies as a male.

## 2016-11-28 ENCOUNTER — Other Ambulatory Visit: Payer: Self-pay | Admitting: *Deleted

## 2016-11-28 DIAGNOSIS — B20 Human immunodeficiency virus [HIV] disease: Secondary | ICD-10-CM

## 2016-11-28 MED ORDER — DOLUTEGRAVIR SODIUM 50 MG PO TABS: ORAL_TABLET | ORAL | 0 refills | Status: DC

## 2016-11-28 MED ORDER — EMTRICITABINE-TENOFOVIR AF 200-25 MG PO TABS: ORAL_TABLET | ORAL | 0 refills | Status: DC

## 2016-11-29 ENCOUNTER — Ambulatory Visit: Payer: Medicaid Other | Admitting: Infectious Disease

## 2016-12-06 ENCOUNTER — Ambulatory Visit: Payer: Medicaid Other

## 2016-12-06 ENCOUNTER — Ambulatory Visit: Payer: Medicaid Other | Admitting: Infectious Disease

## 2017-01-05 ENCOUNTER — Other Ambulatory Visit (HOSPITAL_COMMUNITY)
Admission: RE | Admit: 2017-01-05 | Discharge: 2017-01-05 | Disposition: A | Discharge status: Home / Self Care | Payer: Medicaid Other | Source: Ambulatory Visit | Attending: Infectious Disease | Admitting: Infectious Disease

## 2017-01-05 ENCOUNTER — Ambulatory Visit (INDEPENDENT_AMBULATORY_CARE_PROVIDER_SITE_OTHER): Payer: Medicaid Other | Admitting: Pharmacist Clinician (PhC)/ Clinical Pharmacy Specialist

## 2017-01-05 DIAGNOSIS — B20 Human immunodeficiency virus [HIV] disease: Secondary | ICD-10-CM

## 2017-01-05 DIAGNOSIS — E349 Endocrine disorder, unspecified: Secondary | ICD-10-CM

## 2017-01-05 DIAGNOSIS — Z23 Encounter for immunization: Secondary | ICD-10-CM

## 2017-01-05 DIAGNOSIS — IMO0001 Reserved for inherently not codable concepts without codable children: Secondary | ICD-10-CM

## 2017-01-05 DIAGNOSIS — I10 Essential (primary) hypertension: Secondary | ICD-10-CM

## 2017-01-05 DIAGNOSIS — Z7251 High risk heterosexual behavior: Secondary | ICD-10-CM

## 2017-01-05 DIAGNOSIS — F640 Transsexualism: Secondary | ICD-10-CM | POA: Diagnosis not present

## 2017-01-05 LAB — CBC
HEMATOCRIT: 52.5 % — AB (ref 38.5–50.0)
HEMOGLOBIN: 17.3 g/dL — AB (ref 13.2–17.1)
MCH: 32.6 pg (ref 27.0–33.0)
MCHC: 33 g/dL (ref 32.0–36.0)
MCV: 99.1 fL (ref 80.0–100.0)
MPV: 9.2 fL (ref 7.5–12.5)
Platelets: 353 10*3/uL (ref 140–400)
RBC: 5.3 MIL/uL (ref 4.20–5.80)
RDW: 12.8 % (ref 11.0–15.0)
WBC: 5.3 10*3/uL (ref 3.8–10.8)

## 2017-01-05 LAB — COMPLETE METABOLIC PANEL WITH GFR
ALBUMIN: 4.2 g/dL (ref 3.6–5.1)
ALT: 25 U/L (ref 9–46)
AST: 28 U/L (ref 10–40)
Alkaline Phosphatase: 89 U/L (ref 40–115)
BUN: 13 mg/dL (ref 7–25)
CO2: 22 mmol/L (ref 20–32)
Calcium: 9.7 mg/dL (ref 8.6–10.3)
Chloride: 103 mmol/L (ref 98–110)
Creat: 1.2 mg/dL (ref 0.60–1.35)
GFR, Est African American: 89 mL/min (ref >=60)
GFR, Est Non African American: 77 mL/min (ref >=60)
GLUCOSE: 77 mg/dL (ref 65–99)
POTASSIUM: 4.3 mmol/L (ref 3.5–5.3)
SODIUM: 138 mmol/L (ref 135–146)
Total Bilirubin: 0.4 mg/dL (ref 0.2–1.2)
Total Protein: 7.9 g/dL (ref 6.1–8.1)

## 2017-01-05 MED ORDER — DOLUTEGRAVIR SODIUM 50 MG PO TABS: ORAL_TABLET | ORAL | 2 refills | Status: DC

## 2017-01-05 MED ORDER — SPIRONOLACTONE 100 MG PO TABS: ORAL_TABLET | ORAL | 2 refills | Status: DC

## 2017-01-05 MED ORDER — EMTRICITABINE-TENOFOVIR AF 200-25 MG PO TABS: ORAL_TABLET | ORAL | 2 refills | Status: DC

## 2017-01-05 MED ORDER — ESTRADIOL VALERATE 20 MG/ML IM OIL: TOPICAL_OIL | INTRAMUSCULAR | 1 refills | Status: DC

## 2017-01-05 NOTE — Progress Notes (Signed)
HPI: Stephen Griffin is a 36 y.o. male who is here to f/u with pharmacy after multiple missing appts.   Allergies: No Known Allergies  Vitals:    Past Medical History: Past Medical History:  Diagnosis Date  . 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 Asthma   . DVT (deep venous thrombosis) ([place])    "LLE" (12/13/2012)  . Esophageal candidiasis ([place])    Stephen Griffin 12/13/2012  . Stephen Griffin(856.3)    "weekly" (12/13/2012)  . HIV (human immunodeficiency virus infection) ([place])   . Pneumonia    "once" (12/13/2012)    Social History: Social History   Social History  . Marital status: Single    Spouse name: N/A  . Number of children: N/A  . Years of education: N/A   Occupational History  . Disability Unemployed   Social History Main Topics  . Smoking status: Current Every Day Smoker    Packs/day: 0.50    Years: 14.00    Types: Cigarettes  . Smokeless tobacco: Never Used     Comment: 5 ciggs daily  . Alcohol use 28.8 oz/week    48 Cans of beer per week     Comment: 12/13/2012 "2, 25oz beers/week"  . Drug use: No     Comment: 12/13/2012 "couple joints/day"  . Sexual activity: Yes    Partners: Male     Comment: pt. given condoms   Other Topics Concern  . Not on file   Social History Narrative   Currently living in [place] with father. Feels safe in the home; gets along with father. Is the youngest of 3 children, other siblings are medically well. Does not work; is on disability.    Previous Regimen: DTG/Descovy  Current Regimen: DTG/Descovy  Labs: HIV 1 RNA Quant (copies/mL)  Date Value  12/08/2015 56 (H)  08/17/2015 32,773 (H)  02/17/2015 <20   CD4 T Cell Abs (/uL)  Date Value  05/25/2016 280 (L)  12/08/2015 250 (L)  08/17/2015 190 (L)   Hep B S Ab (no units)  Date Value  07/24/2006 NO   Hepatitis B Surface Ag (no units)  Date Value  12/13/2012 NEGATIVE   HCV Ab (no units)  Date Value  12/13/2012 NEGATIVE    CrCl: CrCl cannot be calculated  (Patient's most recent lab result is older than the maximum 21 days allowed.).  Lipids:    Component Value Date/Time   CHOL 103 (L) 08/17/2015 1143   TRIG 105 08/17/2015 1143   HDL 49 08/17/2015 1143   CHOLHDL 2.1 08/17/2015 1143   VLDL 21 08/17/2015 1143   LDLCALC 33 08/17/2015 1143    Assessment: Chocolate has not seen [place] since July of last year. During her medication reconciliation, she stated that she has been consistent with her HIV meds. Her numbers looked pretty good late last year. She is not on estrogen right now or aldactone either. She has missed multiple appts. Therefore, we are going to get all labs today and set her up with a f/u with Dr. Tommy Griffin in [place]. Told her that if she will make the appt, I'll send in a short supply of estrogen. If she continues to miss appts, we will not give anymore refills for estrogen.   She was recently seen in the ED in late June after passing out at home. She was intoxicated with ETOH at the time. She refused urine drug screen. The PA suspected drug may be involved. She told us that she only smokes marijuana. We are going to  re-test her for hep C today also along with all of the other labs.   Recommendations:  All HIV labs today Continue DTG/Descovy Refill short term delestrogen and aldactone F/u with Dr. Tommy Griffin in Sept  Stephen Griffin, PharmD, BCPS, AAHIVP, [facility] 01/05/2017, 2:42 PM

## 2017-01-06 ENCOUNTER — Telehealth: Payer: Self-pay | Admitting: Pharmacist Clinician (PhC)/ Clinical Pharmacy Specialist

## 2017-01-06 LAB — HEPATITIS C ANTIBODY: HCV AB: NONREACTIVE

## 2017-01-06 LAB — HEPATITIS B SURFACE ANTIBODY,QUALITATIVE: Hep B S Ab: REACTIVE — AB

## 2017-01-06 LAB — RPR

## 2017-01-06 LAB — HEPATITIS A ANTIBODY, TOTAL: HEP A TOTAL AB: NONREACTIVE

## 2017-01-06 NOTE — Telephone Encounter (Signed)
Had to call in for meds refill to [facility] for some reason. Will give enough estrogen until the next appt.

## 2017-01-06 NOTE — Telephone Encounter (Signed)
THanks Min h

## 2017-01-09 LAB — URINE CYTOLOGY ANCILLARY ONLY
CHLAMYDIA, DNA PROBE: NEGATIVE
Neisseria Gonorrhea: NEGATIVE

## 2017-01-11 ENCOUNTER — Other Ambulatory Visit: Payer: Self-pay | Admitting: Infectious Disease

## 2017-01-11 DIAGNOSIS — E349 Endocrine disorder, unspecified: Secondary | ICD-10-CM

## 2017-01-11 DIAGNOSIS — IMO0001 Reserved for inherently not codable concepts without codable children: Secondary | ICD-10-CM

## 2017-01-11 DIAGNOSIS — B20 Human immunodeficiency virus [HIV] disease: Secondary | ICD-10-CM

## 2017-01-11 DIAGNOSIS — F640 Transsexualism: Secondary | ICD-10-CM

## 2017-01-12 LAB — HIV RNA, RTPCR W/R GT (RTI, PI,INT)
HIV-1 RNA, QN PCR: 22500 copies/mL — ABNORMAL HIGH
HIV-1 RNA, QN PCR: 4.35 Log copies/mL — ABNORMAL HIGH

## 2017-01-15 LAB — RFLX HIV-1 INTEGRASE GENOTYPE

## 2017-01-18 LAB — HIV-1 GENOTYPE: HIV-1 Genotype: DETECTED — AB

## 2017-01-20 ENCOUNTER — Other Ambulatory Visit: Payer: Self-pay | Admitting: Pharmacist Clinician (PhC)/ Clinical Pharmacy Specialist

## 2017-01-20 MED ORDER — DARUNAVIR-COBICISTAT 800-150 MG PO TABS: 1.0000 | ORAL_TABLET | Freq: Every day | ORAL | 5 refills | Status: DC

## 2017-01-20 MED ORDER — DOLUTEGRAVIR SODIUM 50 MG PO TABS: 50.0000 mg | ORAL_TABLET | Freq: Two times a day (BID) | ORAL | 5 refills | Status: DC

## 2017-01-20 NOTE — Progress Notes (Signed)
Called Chocolate to tell her to stop the current therapy and start BID Tivicay and Qday Prezcobix. Reminded her of the upcoming appt. Rx sent to [facility] pharmacy to be mailed.    HIV Genotype Composite Data Genotype Dates:   Mutations in Bold impact drug susceptibility RT Mutations K65R, D67G, K219E, K103N, Y181C, K238T, K101Q  PI Mutations None  Integrase Mutations Didn't do last geno   Interpretation of Genotype Data per [employer] HIV Database Nucleoside RTIs  abacavir (ABC) Intermediate Resistance zidovudine (AZT) Susceptible emtricitabine (FTC) Intermediate Resistance lamivudine (3TC) Intermediate Resistance tenofovir (TDF) High-Level Resistance   Non-Nucleoside RTIs  efavirenz (EFV) High-Level Resistance etravirine (ETR) Intermediate Resistance nevirapine (NVP) High-Level Resistance rilpivirine (RPV) Intermediate Resistance   Protease Inhibitors  None   Integrase Inhibitors  None

## 2017-01-23 ENCOUNTER — Telehealth: Payer: Self-pay | Admitting: *Deleted

## 2017-01-23 ENCOUNTER — Other Ambulatory Visit: Payer: Self-pay | Admitting: *Deleted

## 2017-01-23 MED ORDER — DOLUTEGRAVIR SODIUM 50 MG PO TABS: 50.0000 mg | ORAL_TABLET | Freq: Two times a day (BID) | ORAL | 5 refills | Status: DC

## 2017-01-23 MED ORDER — DARUNAVIR-COBICISTAT 800-150 MG PO TABS: 1.0000 | ORAL_TABLET | Freq: Every day | ORAL | 5 refills | Status: DC

## 2017-01-23 NOTE — Telephone Encounter (Signed)
-----   Message from Truman Hayward, MD sent at 01/20/2017 11:54 AM EDT ----- Yes the PRezcobix is once a day and the Tivicay now BID. She needs to meet with [facility] pharmacy if possible whiel I am out of town ----- Message ----- From: Georgena Spurling, CMA Sent: 01/19/2017   4:50 PM To: Truman Hayward, MD  Is the Prezcobix once a day? ----- Message ----- From: Tommy Medal, Lavell Islam, MD Sent: 01/19/2017   1:04 PM To: Dinah Beers, [place], [facility] Triage Nurse Pool  Chocolate needs to STOP Her regimen IMMEDIATELY she has blown through TNF with a K65R now picked up. I would consider Prezcobix, Tivicay BID for her. This is NOT good news> Note the INSTI R was not performed

## 2017-01-23 NOTE — Telephone Encounter (Signed)
Left patient a voice mail to return my call. Stephen Griffin

## 2017-02-08 ENCOUNTER — Other Ambulatory Visit: Payer: Self-pay | Admitting: Infectious Disease

## 2017-02-08 ENCOUNTER — Encounter: Payer: Self-pay | Admitting: Infectious Disease

## 2017-02-08 ENCOUNTER — Ambulatory Visit (INDEPENDENT_AMBULATORY_CARE_PROVIDER_SITE_OTHER): Payer: Medicaid Other | Admitting: Infectious Disease

## 2017-02-08 VITALS — BP 149/81 | HR 98 | Temp 97.7°F | Wt 147.0 lb

## 2017-02-08 DIAGNOSIS — F640 Transsexualism: Secondary | ICD-10-CM

## 2017-02-08 DIAGNOSIS — Z9119 Patient's noncompliance with other medical treatment and regimen: Secondary | ICD-10-CM | POA: Diagnosis not present

## 2017-02-08 DIAGNOSIS — Z23 Encounter for immunization: Secondary | ICD-10-CM | POA: Diagnosis present

## 2017-02-08 DIAGNOSIS — E349 Endocrine disorder, unspecified: Secondary | ICD-10-CM

## 2017-02-08 DIAGNOSIS — B20 Human immunodeficiency virus [HIV] disease: Secondary | ICD-10-CM | POA: Diagnosis present

## 2017-02-08 DIAGNOSIS — I10 Essential (primary) hypertension: Secondary | ICD-10-CM | POA: Diagnosis not present

## 2017-02-08 DIAGNOSIS — Z789 Other specified health status: Secondary | ICD-10-CM

## 2017-02-08 DIAGNOSIS — IMO0001 Reserved for inherently not codable concepts without codable children: Secondary | ICD-10-CM

## 2017-02-08 DIAGNOSIS — Z91199 Patient's noncompliance with other medical treatment and regimen due to unspecified reason: Secondary | ICD-10-CM

## 2017-02-08 MED ORDER — DOLUTEGRAVIR SODIUM 50 MG PO TABS: 50.0000 mg | ORAL_TABLET | Freq: Two times a day (BID) | ORAL | 5 refills | Status: DC

## 2017-02-08 MED ORDER — DARUN-COBIC-EMTRICIT-TENOFAF 800-150-200-10 MG PO TABS: 1.0000 | ORAL_TABLET | Freq: Every day | ORAL | 5 refills | Status: DC

## 2017-02-08 MED ORDER — DARUNAVIR-COBICISTAT 800-150 MG PO TABS: 1.0000 | ORAL_TABLET | Freq: Every day | ORAL | 5 refills | Status: DC

## 2017-02-08 NOTE — Progress Notes (Signed)
Chief complaint: followup for HIV on meds, transgender identity,  Subjective:    Patient ID: Stephen Griffin, male    DOB: [birth year], 36 y.o.   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 Upon closer questioning she admits to having not always taken both the Tivicay and Descovy together. When she failed with R I had wanted her to switch to Tivicay BID and Prezcobix but she claims to have only heard the part about the BID Tivicay.   We will switch instead today to TIVICAY BID and SYMTUZA given that the latter is a smaller sized pill.     Lab Results  Component Value Date   HIV1RNAQUANT 56 (H) 12/08/2015   HIV1RNAQUANT 32,773 (H) 08/17/2015   HIV1RNAQUANT <20 02/17/2015   Lab Results  Component Value Date   CD4TABS 280 (L) 05/25/2016   CD4TABS 250 (L) 12/08/2015   CD4TABS 190 (L) 08/17/2015      Lab Results  Component Value Date   HIV1RNAQUANT 56 (H) 12/08/2015   Lab Results  Component Value Date   CD4TABS 280 (L) 05/25/2016   CD4TABS 250 (L) 12/08/2015   CD4TABS 190 (L) 08/17/2015    Past Medical History:  Diagnosis Date  . 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 Asthma   . DVT (deep venous thrombosis) ([place])    "LLE" (12/13/2012)  . Esophageal candidiasis ([place])    [name] 12/13/2012  . EPPIRJJO(841.6)    "weekly" (12/13/2012)  . HIV (human immunodeficiency virus infection) ([place])   . Pneumonia    "once" (12/13/2012)    Past Surgical History:  Procedure Laterality Date  . 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 CHOLECYSTECTOMY N/A 12/17/2012   Procedure: LAPAROSCOPIC CHOLECYSTECTOMY WITH INTRAOPERATIVE CHOLANGIOGRAM;  Surgeon: Harl Bowie, MD;  Location: [facility];  Service: General;  Laterality: N/A;  . ERCP N/A 12/14/2012   Procedure: ENDOSCOPIC RETROGRADE  CHOLANGIOPANCREATOGRAPHY (ERCP);  Surgeon: Inda Castle, MD;  Location: [facility];  Service: Gastroenterology;  Laterality: N/A;  . INCISE AND DRAIN ABCESS Left 2008   groin; psoas intraabdominal/notes 09/07/2006 (12/13/2012)  . INCISION AND DRAINAGE OF WOUND Left 03/2009   index finger    Family History  Problem Relation Age of Onset  . Hypertension Mother    Mom with HTN   Social History   Social History  . Marital status: Single    Spouse name: N/A  . Number of children: N/A  . Years of education: N/A   Occupational History  . Disability Unemployed   Social History Main Topics  . Smoking status: Current Every Day Smoker    Packs/day: 0.50    Years: 14.00    Types: Cigarettes  . Smokeless tobacco: Never Used     Comment: 5 ciggs daily  . Alcohol use 28.8 oz/week    48 Cans of beer per week     Comment: 12/13/2012 "2, 25oz beers/week"  . Drug use: No     Comment: 12/13/2012 "couple joints/day"  . Sexual activity: Yes    Partners: Male     Comment: pt. given condoms   Other Topics Concern  . None   Social History Narrative   Currently living in [place] with father. Feels safe in the home; gets along with father. Is the youngest of 3 children, other siblings  are medically well. Does not work; is on disability.    No Known Allergies   Current Outpatient Prescriptions:  .  darunavir-cobicistat (PREZCOBIX) 800-150 MG tablet, Take 1 tablet by mouth daily with breakfast. Swallow whole. Do NOT crush, break or chew tablets. Take with food., Disp: 30 tablet, Rfl: 5 .  darunavir-cobicistat (PREZCOBIX) 800-150 MG tablet, Take 1 tablet by mouth daily with breakfast. Swallow whole. Do NOT crush, break or chew tablets. Take with food., Disp: 30 tablet, Rfl: 5 .  dolutegravir (TIVICAY) 50 MG tablet, Take 1 tablet (50 mg total) by mouth 2 (two) times daily., Disp: 60 tablet, Rfl: 5 .  dolutegravir (TIVICAY) 50 MG tablet, Take 1 tablet (50 mg total) by mouth 2 (two) times  daily., Disp: 60 tablet, Rfl: 5 .  dronabinol (MARINOL) 5 MG capsule, Take 1 capsule (5 mg total) by mouth 2 (two) times daily before a meal. (Patient not taking: Reported on 01/05/2017), Disp: 60 capsule, Rfl: 4 .  DULoxetine (CYMBALTA) 30 MG capsule, TAKE 1 CAPSULE BY MOUTH AT BEDTIME **NEEDS APPOINTMENT** (Patient not taking: Reported on 01/05/2017), Disp: 30 capsule, Rfl: 0 .  estradiol valerate (DELESTROGEN) 20 MG/ML injection, INJECT 0.5ML (10MG  DOSE) INTRAMUSCULARLY EVERY 14 DAYS, Disp: 5 mL, Rfl: 1 .  fluconazole (DIFLUCAN) 100 MG tablet, TAKE 1 TABLET BY MOUTH EVERY THURSDAY (Patient not taking: Reported on 01/05/2017), Disp: 15 tablet, Rfl: 3 .  spironolactone (ALDACTONE) 100 MG tablet, TAKE 1.5 TABLETS BY MOUTH EVERY DAY, Disp: 45 tablet, Rfl: 2 .  Syringe/Needle, Disp, (SYRINGE 3CC/18GX1-1/2") 18G X 1-1/2" 3 ML MISC, Inject 1 Syringe as directed every 14 (fourteen) days., Disp: 100 each, Rfl: 2     Review of Systems  Constitutional: Negative for activity change, appetite change, chills and diaphoresis.  HENT: Negative for congestion and dental problem.   Eyes: Negative for photophobia.  Respiratory: Negative for cough.   Cardiovascular: Negative for leg swelling.  Gastrointestinal: Negative for diarrhea, nausea and vomiting.  Genitourinary: Negative for dysuria and flank pain.  Musculoskeletal: Negative for arthralgias and back pain.  Neurological: Negative for light-headedness.  Psychiatric/Behavioral: Negative for agitation, behavioral problems, confusion, decreased concentration and dysphoric mood. The patient is nervous/anxious.        Objective:   Physical Exam  Constitutional: He is oriented to person, place, and time. He appears well-developed and well-nourished.  HENT:  Head: Normocephalic and atraumatic.  Eyes: Conjunctivae and EOM are normal.  Neck: Normal range of motion. Neck supple.  Cardiovascular: Normal rate and regular rhythm.   Pulmonary/Chest: Effort normal.  No respiratory distress. He has no wheezes.  Abdominal: Soft. He exhibits no distension.  Musculoskeletal: Normal range of motion. He exhibits no edema or tenderness.  Neurological: He is alert and oriented to person, place, and time.  Skin: Skin is warm and dry. No rash noted. No erythema. No pallor.  Psychiatric: He has a normal mood and affect. His speech is normal and behavior is normal. Judgment and thought content normal. Cognition and memory are normal.          Assessment & Plan:   HIV:   CHECK VL WITH REFLEX GENOTYPE INCLUDING INSTI R TESTING  CHANGE HER TO SYMTUZA + BID TIVICAY  Transgender:  -- continue  ALDACTONE  estradiol    HTN: 150mg  of aldactone Vitals:   02/08/17 1454  BP: (!) 149/81  Pulse: 98  Temp: 97.7 F (36.5 C)   I spent greater than 40 minutes with the patient including greater than 50% of  time in face to face counsel of the patient re the DIRE consequences of poor adherence and virological failure with Resistance in her case to TNF, in ensuring that she has a regimen with at least TWO fully active drugs, in trying not to lose ground we made in immune health and preventing transmission (with a K65R she will be high risk for other pts even those on PrEP),  and in coordination of her care with pharmacy, and case management.

## 2017-02-08 NOTE — Progress Notes (Signed)
HPI: Stephen Griffin is a 36 y.o. male who is here to f/u with Dr. Tommy Medal for her HIV.   Allergies: No Known Allergies  Vitals: Temp: 97.7 F (36.5 C) (09/12 1454) Temp Source: Oral (09/12 1454) BP: 149/81 (09/12 1454) Pulse Rate: 98 (09/12 1454)  Past Medical History: Past Medical History:  Diagnosis Date  . 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 Asthma   . DVT (deep venous thrombosis) ([place])    "LLE" (12/13/2012)  . Esophageal candidiasis ([place])    [name] 12/13/2012  . WNUUVOZD(664.4)    "weekly" (12/13/2012)  . HIV (human immunodeficiency virus infection) ([place])   . Pneumonia    "once" (12/13/2012)    Social History: Social History   Social History  . Marital status: Single    Spouse name: N/A  . Number of children: N/A  . Years of education: N/A   Occupational History  . Disability Unemployed   Social History Main Topics  . Smoking status: Current Every Day Smoker    Packs/day: 0.50    Years: 14.00    Types: Cigarettes  . Smokeless tobacco: Never Used     Comment: 5 ciggs daily  . Alcohol use 28.8 oz/week    48 Cans of beer per week     Comment: 12/13/2012 "2, 25oz beers/week"  . Drug use: No     Comment: 12/13/2012 "couple joints/day"  . Sexual activity: Yes    Partners: Male     Comment: pt. given condoms   Other Topics Concern  . None   Social History Narrative   Currently living in [place] with father. Feels safe in the home; gets along with father. Is the youngest of 3 children, other siblings are medically well. Does not work; is on disability.    Previous Regimen:   Current Regimen: DTG/Descovy  Labs: HIV 1 RNA Quant (copies/mL)  Date Value  12/08/2015 56 (H)  08/17/2015 32,773 (H)  02/17/2015 <20   CD4 T Cell Abs (/uL)  Date Value  05/25/2016 280 (L)  12/08/2015 250 (L)  08/17/2015 190 (L)   Hep B S Ab (no units)  Date Value  01/05/2017 REACTIVE (A)   Hepatitis B Surface Ag (no units)  Date Value  12/13/2012 NEGATIVE    HCV Ab (no units)  Date Value  01/05/2017 NON-REACTIVE    CrCl: CrCl cannot be calculated (Patient's most recent lab result is older than the maximum 21 days allowed.).  Lipids:    Component Value Date/Time   CHOL 103 (L) 08/17/2015 1143   TRIG 105 08/17/2015 1143   HDL 49 08/17/2015 1143   CHOLHDL 2.1 08/17/2015 1143   VLDL 21 08/17/2015 1143   LDLCALC 33 08/17/2015 1143   HIV Genotype Composite Data Genotype Dates:   Mutations in Bold impact drug susceptibility RT Mutations K65R, D67G, K219E, K101EQ, K103N, Y181C, K238T, R211K  PI Mutations None  Integrase Mutations Didn't do   Interpretation of Genotype Data per Stanford HIV Database Nucleoside RTIs  abacavir (ABC) Intermediate Resistance zidovudine (AZT) Susceptible emtricitabine (FTC) Intermediate Resistance lamivudine (3TC) Intermediate Resistance tenofovir (TDF) High-Level Resistance   Non-Nucleoside RTIs  efavirenz (EFV) High-Level Resistance etravirine (ETR) Intermediate Resistance nevirapine (NVP) High-Level Resistance rilpivirine (RPV) High-Level Resistance   Protease Inhibitors  None   Integrase Inhibitors      Assessment: Chocolate has had a terrible hx of compliance with her ART. 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 The likelihood of integrase mutation is probably there also. We ordered  it at the last appt with med. [facility] stated that they couldn't do the integrase genotype because there wasn't "enough" sample. I'm not sure why that is because they were able to do a regular genotype. We are going to change her regimen to Symtuza + BID Tivicay today and send it to [facility]. They will mail it out to her. We are going to repeat her genotype today.   Recommendations:  Change ART to Symtuza + BID Tivicay Labs today F/u with Dr. Tommy Medal   Onnie Boer, PharmD, BCPS, AAHIVP, CPP Clinical Infectious [facility] 02/08/2017, 3:42 PM

## 2017-02-08 NOTE — Patient Instructions (Signed)
Your NEW regimen is  PREZCOBIX ONE PILL DAILY (Can crush it or cut it in half)  Plus TIVICAY TWICE DAILY  Lets get labs today and please see [place] or Cassie in 2 weeks

## 2017-02-09 LAB — T-HELPER CELL (CD4) - (RCID CLINIC ONLY)
CD4 T CELL ABS: 230 /uL — AB (ref 400–2700)
CD4 T CELL HELPER: 14 % — AB (ref 33–55)

## 2017-02-14 LAB — HIV RNA, RTPCR W/R GT (RTI, PI,INT)
HIV 1 RNA Quant: 53 copies/mL — ABNORMAL HIGH
HIV-1 RNA Quant, Log: 1.72 Log copies/mL — ABNORMAL HIGH

## 2017-03-15 ENCOUNTER — Ambulatory Visit: Payer: Medicaid Other | Admitting: Infectious Disease

## 2017-03-23 ENCOUNTER — Telehealth: Payer: Self-pay | Admitting: Pharmacist Clinician (PhC)/ Clinical Pharmacy Specialist

## 2017-03-23 NOTE — Telephone Encounter (Signed)
Chocolate missed her appt recently. Claimed to be taking meds. Rescheduled her 10/30.

## 2017-03-23 NOTE — Telephone Encounter (Signed)
Thanks Minh! 

## 2017-03-28 ENCOUNTER — Ambulatory Visit: Payer: Medicaid Other | Admitting: Infectious Disease

## 2017-04-06 ENCOUNTER — Telehealth: Payer: Self-pay

## 2017-04-06 NOTE — Telephone Encounter (Signed)
Rash on hands and groin area for 3-4 days.  He would like to see Dr Drucilla Schmidt for this issue. Advised no appointment until next [facility]   Patient stated he wanted to wait until Monday.    Laverle Patter, RN

## 2017-04-10 ENCOUNTER — Encounter: Payer: Self-pay | Admitting: Infectious Disease

## 2017-04-10 ENCOUNTER — Ambulatory Visit (INDEPENDENT_AMBULATORY_CARE_PROVIDER_SITE_OTHER): Payer: Medicaid Other | Admitting: Infectious Disease

## 2017-04-10 ENCOUNTER — Other Ambulatory Visit (HOSPITAL_COMMUNITY)
Admission: RE | Admit: 2017-04-10 | Discharge: 2017-04-10 | Disposition: A | Discharge status: Home / Self Care | Payer: Medicaid Other | Source: Ambulatory Visit | Attending: Infectious Disease | Admitting: Infectious Disease

## 2017-04-10 VITALS — BP 130/86 | HR 92 | Temp 98.1°F | Wt 145.0 lb

## 2017-04-10 DIAGNOSIS — B20 Human immunodeficiency virus [HIV] disease: Secondary | ICD-10-CM | POA: Insufficient documentation

## 2017-04-10 DIAGNOSIS — R21 Rash and other nonspecific skin eruption: Secondary | ICD-10-CM

## 2017-04-10 DIAGNOSIS — A5149 Other secondary syphilitic conditions: Secondary | ICD-10-CM

## 2017-04-10 DIAGNOSIS — K8309 Other cholangitis: Secondary | ICD-10-CM

## 2017-04-10 HISTORY — DX: Rash and other nonspecific skin eruption: R21

## 2017-04-10 HISTORY — DX: Other secondary syphilitic conditions: A51.49

## 2017-04-10 MED ORDER — PENICILLIN G BENZATHINE 1200000 UNIT/2ML IM SUSP
1.2000 10*6.[IU] | Freq: Once | INTRAMUSCULAR | Status: AC
  Administered 2017-04-10: 1.2 10*6.[IU] via INTRAMUSCULAR

## 2017-04-10 NOTE — Addendum Note (Signed)
Addended by: Landis Gandy on: 04/10/2017 10:37 AM   Modules accepted: Orders

## 2017-04-10 NOTE — Progress Notes (Signed)
Subjective:    Patient ID: Stephen Griffin, adult    DOB: April 25, 1981, 36 y.o.   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 EPic is causing me to write 2 notes. Please see other note for further narrative.   Past Medical History:  Diagnosis Date  . 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 Asthma   . DVT (deep venous thrombosis) ([place])    "LLE" (12/13/2012)  . Esophageal candidiasis ([place])    [name] 12/13/2012  . QIHKVQQV(956.3)    "weekly" (12/13/2012)  . HIV (human immunodeficiency virus infection) ([place])   . Pneumonia    "once" (12/13/2012)  . Rash 04/10/2017  . 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 INCISE AND DRAIN ABCESS Left 2008   groin; psoas intraabdominal/notes 09/07/2006 (12/13/2012)  . INCISION AND DRAINAGE OF WOUND Left 03/2009   index finger    Family History  Problem Relation Age of Onset  . Hypertension Mother       Social History   Socioeconomic History  . Marital status: Single    Spouse name: None  . Number of children: None  . Years of education: None  . Highest education level: None  Social Needs  . Financial resource strain: None  . Food insecurity - worry: None  . Food insecurity - inability: None  . Transportation needs - medical: None  . Transportation needs - non-medical: None  Occupational History  . Occupation: Disability    Employer: UNEMPLOYED  Tobacco Use  . Smoking status: Current Every Day Smoker    Packs/day: 0.50    Years: 14.00    Pack years: 7.00    Types: Cigarettes  . Smokeless tobacco: Never Used  . Tobacco comment: 5 ciggs daily  Substance and Sexual Activity  . Alcohol use: Yes    Alcohol/week: 28.8 oz    Types: 48 Cans of beer per week    Comment: 12/13/2012 "2, 25oz beers/week"  . Drug use: No    Comment: 12/13/2012 "couple joints/day"  . Sexual activity: Yes    Partners: Male    Comment: pt. given  condoms  Other Topics Concern  . None  Social History Narrative   Currently living in [place] with father. Feels safe in the home; gets along with father. Is the youngest of 3 children, other siblings are medically well. Does not work; is on disability.    No Known Allergies   Current Outpatient Medications:  .  Darunavir-Cobicisctat-Emtricitabine-Tenofovir Alafenamide (SYMTUZA) 800-150-200-10 MG TABS, Take 1 tablet by mouth daily with breakfast., Disp: 30 tablet, Rfl: 5 .  dolutegravir (TIVICAY) 50 MG tablet, Take 1 tablet (50 mg total) by mouth 2 (two) times daily., Disp: 60 tablet, Rfl: 5 .  dronabinol (MARINOL) 5 MG capsule, Take 1 capsule (5 mg total) by mouth 2 (two) times daily before a meal., Disp: 60 capsule, Rfl: 4 .  DULoxetine (CYMBALTA) 30 MG capsule, Take 1 capsule (30 mg total) by mouth daily., Disp: 30 capsule, Rfl: 3 .  estradiol valerate (DELESTROGEN) 20 MG/ML injection, INJECT 0.5ML (10MG  DOSE) INTRAMUSCULARLY EVERY 14 DAYS, Disp: 5 mL, Rfl: 1 .  fluconazole (DIFLUCAN) 100 MG tablet, TAKE 1 TABLET BY MOUTH EVERY THURSDAY, Disp: 15 tablet, Rfl: 3 .  spironolactone (ALDACTONE) 100 MG tablet, TAKE 1.5 TABLETS BY MOUTH EVERY DAY, Disp: 45 tablet, Rfl: 2 .  Syringe/Needle, Disp, (SYRINGE 3CC/18GX1-1/2") 18G X  1-1/2" 3 ML MISC, Inject 1 Syringe as directed every 14 (fourteen) days., Disp: 100 each, Rfl: 2   Review of Systems  Constitutional: Negative for activity change, appetite change, chills, diaphoresis, fatigue, fever and unexpected weight change.  HENT: Negative for congestion, rhinorrhea, sinus pressure, sneezing, sore throat and trouble swallowing.   Eyes: Negative for photophobia and visual disturbance.  Respiratory: Negative for cough, chest tightness, shortness of breath, wheezing and stridor.   Cardiovascular: Negative for chest pain, palpitations and leg swelling.  Gastrointestinal: Negative for abdominal distention, abdominal pain, anal bleeding,  blood in stool, constipation, diarrhea, nausea and vomiting.  Genitourinary: Negative for difficulty urinating, dysuria, flank pain and hematuria.  Musculoskeletal: Negative for arthralgias, back pain, gait problem, joint swelling and myalgias.  Skin: Positive for rash. Negative for color change, pallor and wound.  Neurological: Negative for dizziness, tremors, weakness and light-headedness.  Hematological: Negative for adenopathy. Does not bruise/bleed easily.  Psychiatric/Behavioral: Negative for agitation, behavioral problems, confusion, decreased concentration, dysphoric mood and sleep disturbance.       Objective:   Physical Exam  Constitutional: She is oriented to person, place, and time. She appears well-developed and well-nourished. No distress.  HENT:  Head: Normocephalic and atraumatic.  Mouth/Throat: No oropharyngeal exudate.  Eyes: Conjunctivae and EOM are normal. No scleral icterus.  Neck: Normal range of motion. Neck supple.  Cardiovascular: Normal rate and regular rhythm.  Pulmonary/Chest: Effort normal. No respiratory distress. She has no wheezes.  Abdominal: She exhibits no distension.  Musculoskeletal: She exhibits no edema or tenderness.  Neurological: She is alert and oriented to person, place, and time. She exhibits normal muscle tone. Coordination normal.  Skin: Skin is warm and dry. Rash noted. She is not diaphoretic. No erythema. No pallor.  Psychiatric: She has a normal mood and affect. Her behavior is normal. Judgment and thought content normal.    Syphlitic rash          Assessment & Plan:   Syphilis: check RPR STI, give PCN IM. Counselled her on the fact that STI's can increase risk for HIV transmission. Hopefully her HIV is controlled. Condoms and safe sex only way to prevent STI's I also gave her fliers re [place] and PrEP clinic in case she knows HIV negative individuals who could benefit from PrEP  HIV disease: recheck labs and she now should  know to take TIVICAY BID and SYMTUZA daily

## 2017-04-10 NOTE — Progress Notes (Signed)
HPI: Stephen Griffin is a 36 y.o. adult trans-gender woman with HIV reasonably controlled who is here with apparent secondary syphilis with rash on her hands and genitalia.  She is not taking her ARV correctly as she is taking TWO TIVICAY and SYMTUZA when the [place] should be TWICE daily and SYMTUZA daily. I counselled her on the correct dose  Lab Results  Component Value Date   HIV1RNAQUANT 53 (H) 02/08/2017   HIV1RNAQUANT 56 (H) 12/08/2015   HIV1RNAQUANT 32,773 (H) 08/17/2015   Lab Results  Component Value Date   CD4TABS 230 (L) 02/08/2017   CD4TABS 280 (L) 05/25/2016   CD4TABS 250 (L) 12/08/2015    Allergies: No Known Allergies  Vitals: Temp: 98.1 F (36.7 C) (11/12 0928) Temp Source: Oral (11/12 0928) BP: 130/86 (11/12 0928) Pulse Rate: 92 (11/12 0928)  Past Medical History: Past Medical History:  Diagnosis Date  . 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 Asthma   . DVT (deep venous thrombosis) ([place])    "LLE" (12/13/2012)  . Esophageal candidiasis ([place])    [name] 12/13/2012  . CVELFYBO(175.1)    "weekly" (12/13/2012)  . HIV (human immunodeficiency virus infection) ([place])   . Pneumonia    "once" (12/13/2012)  . Rash 04/10/2017    Social History: Social History   Socioeconomic History  . Marital status: Single    Spouse name: None  . Number of children: None  . Years of education: None  . Highest education level: None  Social Needs  . Financial resource strain: None  . Food insecurity - worry: None  . Food insecurity - inability: None  . Transportation needs - medical: None  . Transportation needs - non-medical: None  Occupational History  . Occupation: Disability    Employer: UNEMPLOYED  Tobacco Use  . Smoking status: Current Every Day Smoker    Packs/day: 0.50    Years: 14.00    Pack years: 7.00    Types: Cigarettes  . Smokeless tobacco: Never Used  . Tobacco comment: 5 ciggs daily  Substance and Sexual Activity  . Alcohol use: Yes    Alcohol/week: 28.8  oz    Types: 48 Cans of beer per week    Comment: 12/13/2012 "2, 25oz beers/week"  . Drug use: No    Comment: 12/13/2012 "couple joints/day"  . Sexual activity: Yes    Partners: Male    Comment: pt. given condoms  Other Topics Concern  . None  Social History Narrative   Currently living in [place] with father. Feels safe in the home; gets along with father. Is the youngest of 3 children, other siblings are medically well. Does not work; is on disability.    Previous Regimen:   Current Regimen: DTG/Descovy  Labs: HIV 1 RNA Quant (copies/mL)  Date Value  02/08/2017 53 (H)  12/08/2015 56 (H)  08/17/2015 32,773 (H)   CD4 T Cell Abs (/uL)  Date Value  02/08/2017 230 (L)  05/25/2016 280 (L)  12/08/2015 250 (L)   Hep B S Ab (no units)  Date Value  01/05/2017 REACTIVE (A)   Hepatitis B Surface Ag (no units)  Date Value  12/13/2012 NEGATIVE   HCV Ab (no units)  Date Value  01/05/2017 NON-REACTIVE    CrCl: CrCl cannot be calculated (Patient's most recent lab result is older than the maximum 21 days allowed.).  Lipids:    Component Value Date/Time   CHOL 103 (L) 08/17/2015 1143   TRIG 105 08/17/2015 1143   HDL 49 08/17/2015 1143  CHOLHDL 2.1 08/17/2015 1143   VLDL 21 08/17/2015 1143   LDLCALC 33 08/17/2015 1143   HIV Genotype Composite Data Genotype Dates:   Mutations in Bold impact drug susceptibility RT Mutations K65R, D67G, K219E, K101EQ, K103N, Y181C, K238T, R211K  PI Mutations None  Integrase Mutations Didn't do   Interpretation of Genotype Data per Stanford HIV Database Nucleoside RTIs  abacavir (ABC) Intermediate Resistance zidovudine (AZT) Susceptible emtricitabine (FTC) Intermediate Resistance lamivudine (3TC) Intermediate Resistance tenofovir (TDF) High-Level Resistance   Non-Nucleoside RTIs  efavirenz (EFV) High-Level Resistance etravirine (ETR) Intermediate Resistance nevirapine (NVP) High-Level Resistance rilpivirine  (RPV) High-Level Resistance   Protease Inhibitors  None   Integrase Inhibitors     A/P:  Syphilis: check RPR an STI's and give 2.4 MU of Bicillin  HIV: she needs to take TIVICAY BID and SYMTUZA daily, will recheck labs today as well.

## 2017-04-11 ENCOUNTER — Other Ambulatory Visit: Payer: Self-pay | Admitting: Infectious Disease

## 2017-04-11 DIAGNOSIS — Z7251 High risk heterosexual behavior: Secondary | ICD-10-CM

## 2017-04-11 DIAGNOSIS — I10 Essential (primary) hypertension: Secondary | ICD-10-CM

## 2017-04-11 DIAGNOSIS — B20 Human immunodeficiency virus [HIV] disease: Secondary | ICD-10-CM

## 2017-04-11 LAB — RPR: RPR: REACTIVE — AB

## 2017-04-11 LAB — CYTOLOGY, (ORAL, ANAL, URETHRAL) ANCILLARY ONLY
CHLAMYDIA, DNA PROBE: POSITIVE — AB
Chlamydia: POSITIVE — AB
Neisseria Gonorrhea: NEGATIVE
Neisseria Gonorrhea: NEGATIVE

## 2017-04-11 LAB — FLUORESCENT TREPONEMAL AB(FTA)-IGG-BLD: FLUORESCENT TREPONEMAL ABS: REACTIVE — AB

## 2017-04-11 LAB — URINE CYTOLOGY ANCILLARY ONLY
CHLAMYDIA, DNA PROBE: NEGATIVE
NEISSERIA GONORRHEA: NEGATIVE

## 2017-04-11 LAB — RPR TITER

## 2017-04-12 ENCOUNTER — Telehealth: Payer: Self-pay | Admitting: *Deleted

## 2017-04-12 ENCOUNTER — Other Ambulatory Visit: Payer: Self-pay | Admitting: *Deleted

## 2017-04-12 DIAGNOSIS — A749 Chlamydial infection, unspecified: Secondary | ICD-10-CM

## 2017-04-12 MED ORDER — DOXYCYCLINE HYCLATE 100 MG PO TABS: 100.0000 mg | ORAL_TABLET | Freq: Two times a day (BID) | ORAL | 0 refills | Status: DC

## 2017-04-12 NOTE — Telephone Encounter (Signed)
Thanks Jackie 

## 2017-04-12 NOTE — Progress Notes (Signed)
Patient notified and Rx sent

## 2017-04-12 NOTE — Telephone Encounter (Signed)
Per Dr. Tommy Medal patient notified that she tested positive for chlamydia and needs treatment with doxycycline 100 mg twice daily x 21 days. Rx sent to pharmacy and patient notified. Stephen Griffin

## 2017-04-13 LAB — HIV RNA, RTPCR W/R GT (RTI, PI,INT)
HIV 1 RNA QUANT: 21 {copies}/mL — AB
HIV-1 RNA Quant, Log: 1.32 Log copies/mL — ABNORMAL HIGH

## 2017-05-10 ENCOUNTER — Other Ambulatory Visit: Payer: Self-pay | Admitting: Infectious Disease

## 2017-05-10 ENCOUNTER — Ambulatory Visit: Payer: Medicaid Other | Admitting: Infectious Disease

## 2017-05-10 DIAGNOSIS — F640 Transsexualism: Secondary | ICD-10-CM

## 2017-05-10 DIAGNOSIS — B20 Human immunodeficiency virus [HIV] disease: Secondary | ICD-10-CM

## 2017-05-10 DIAGNOSIS — E349 Endocrine disorder, unspecified: Secondary | ICD-10-CM

## 2017-05-10 DIAGNOSIS — IMO0001 Reserved for inherently not codable concepts without codable children: Secondary | ICD-10-CM

## 2017-05-11 ENCOUNTER — Ambulatory Visit: Payer: Medicaid Other | Admitting: Infectious Disease

## 2017-06-05 ENCOUNTER — Other Ambulatory Visit: Payer: Self-pay | Admitting: Infectious Disease

## 2017-06-05 DIAGNOSIS — IMO0001 Reserved for inherently not codable concepts without codable children: Secondary | ICD-10-CM

## 2017-06-05 DIAGNOSIS — E349 Endocrine disorder, unspecified: Secondary | ICD-10-CM

## 2017-06-05 DIAGNOSIS — F640 Transsexualism: Principal | ICD-10-CM

## 2017-06-08 ENCOUNTER — Other Ambulatory Visit: Payer: Medicaid Other

## 2017-06-20 ENCOUNTER — Ambulatory Visit: Payer: Medicaid Other | Admitting: Infectious Disease

## 2017-06-26 ENCOUNTER — Telehealth: Payer: Self-pay | Admitting: *Deleted

## 2017-06-26 NOTE — Telephone Encounter (Signed)
Patient called, left message asking for percocet for pain. RN returned call. Patient states she "hurts all over, has trouble sleeping." RN advised we do not prescribe pain medications, asked her to go to her primary care or urgent care for evaluation. She will call Medicaid to see who is accepting new patients to establish primary care. Landis Gandy, RN

## 2017-07-05 ENCOUNTER — Other Ambulatory Visit: Payer: Self-pay | Admitting: *Deleted

## 2017-07-05 ENCOUNTER — Other Ambulatory Visit: Payer: Medicaid Other

## 2017-07-05 DIAGNOSIS — B20 Human immunodeficiency virus [HIV] disease: Secondary | ICD-10-CM

## 2017-07-19 ENCOUNTER — Ambulatory Visit: Payer: Medicaid Other | Admitting: Infectious Disease

## 2017-07-27 ENCOUNTER — Other Ambulatory Visit: Payer: Self-pay | Admitting: Infectious Disease

## 2017-07-27 ENCOUNTER — Other Ambulatory Visit: Payer: Self-pay | Admitting: Behavioral Health

## 2017-07-27 DIAGNOSIS — B20 Human immunodeficiency virus [HIV] disease: Secondary | ICD-10-CM

## 2017-07-27 MED ORDER — DARUN-COBIC-EMTRICIT-TENOFAF 800-150-200-10 MG PO TABS: 1.0000 | ORAL_TABLET | Freq: Every day | ORAL | 0 refills | Status: DC

## 2017-07-27 MED ORDER — DOLUTEGRAVIR SODIUM 50 MG PO TABS: 50.0000 mg | ORAL_TABLET | Freq: Two times a day (BID) | ORAL | 0 refills | Status: DC

## 2017-08-01 ENCOUNTER — Other Ambulatory Visit: Payer: Self-pay | Admitting: Pharmacist Clinician (PhC)/ Clinical Pharmacy Specialist

## 2017-08-01 ENCOUNTER — Other Ambulatory Visit: Payer: Self-pay | Admitting: Infectious Disease

## 2017-08-01 DIAGNOSIS — B20 Human immunodeficiency virus [HIV] disease: Secondary | ICD-10-CM

## 2017-08-17 ENCOUNTER — Ambulatory Visit: Payer: Medicaid Other | Admitting: Infectious Disease

## 2017-08-23 ENCOUNTER — Other Ambulatory Visit: Payer: Self-pay

## 2017-08-23 ENCOUNTER — Other Ambulatory Visit: Payer: Self-pay | Admitting: Pharmacist

## 2017-08-23 ENCOUNTER — Other Ambulatory Visit: Payer: Self-pay | Admitting: Pharmacist Clinician (PhC)/ Clinical Pharmacy Specialist

## 2017-08-23 ENCOUNTER — Telehealth: Payer: Self-pay | Admitting: Pharmacist Clinician (PhC)/ Clinical Pharmacy Specialist

## 2017-08-23 DIAGNOSIS — B20 Human immunodeficiency virus [HIV] disease: Secondary | ICD-10-CM

## 2017-08-23 MED ORDER — DOLUTEGRAVIR SODIUM 50 MG PO TABS: 50.0000 mg | ORAL_TABLET | Freq: Two times a day (BID) | ORAL | 1 refills | Status: DC

## 2017-08-23 MED ORDER — DARUN-COBIC-EMTRICIT-TENOFAF 800-150-200-10 MG PO TABS: 1.0000 | ORAL_TABLET | Freq: Every day | ORAL | 1 refills | Status: DC

## 2017-08-23 NOTE — Telephone Encounter (Signed)
She has missed a bunch of appts. Pharmacy has asked [place] if we can send more refills. She is mostly suppressed. Dr. Tommy Medal ok with sending some more until she can get back here. Made her an appt with Marya Amsler this Friday. She promised to make the appt.   Resistance chart on 9/12 note

## 2017-08-23 NOTE — Progress Notes (Signed)
Have missed multiple appts. Dr Tommy Medal said ok to send a few refills for now and try to bring back.

## 2017-08-25 ENCOUNTER — Ambulatory Visit: Payer: Medicaid Other | Admitting: Family

## 2017-08-25 ENCOUNTER — Telehealth: Payer: Self-pay | Admitting: Behavioral Health

## 2017-08-25 NOTE — Telephone Encounter (Signed)
[place] from [employer] called tor a medication refill of Symtuza and Spironolactone for patient.  Refill was denied due to patient needed an Office visit appointment.  Patient has missed several appointments.  Patient has medications refills at [facility] for [place] and Tivicay. [facility] verbalized understanding. Pricilla Riffle RN

## 2017-09-21 ENCOUNTER — Other Ambulatory Visit: Payer: Medicaid Other

## 2017-09-22 ENCOUNTER — Other Ambulatory Visit: Payer: Medicaid Other

## 2017-09-22 DIAGNOSIS — B20 Human immunodeficiency virus [HIV] disease: Secondary | ICD-10-CM

## 2017-09-22 LAB — COMPLETE METABOLIC PANEL WITH GFR
AG RATIO: 1.4 (calc) (ref 1.0–2.5)
ALT: 28 U/L (ref 9–46)
AST: 26 U/L (ref 10–40)
Albumin: 4.4 g/dL (ref 3.6–5.1)
Alkaline phosphatase (APISO): 73 U/L (ref 40–115)
BILIRUBIN TOTAL: 0.3 mg/dL (ref 0.2–1.2)
BUN: 9 mg/dL (ref 7–25)
CHLORIDE: 106 mmol/L (ref 98–110)
CO2: 25 mmol/L (ref 20–32)
Calcium: 9.2 mg/dL (ref 8.6–10.3)
Creat: 0.89 mg/dL (ref 0.60–1.35)
GFR, Est African American: 127 mL/min/{1.73_m2} (ref >=60)
GFR, Est Non African American: 109 mL/min/{1.73_m2} (ref >=60)
GLOBULIN: 3.1 g/dL (ref 1.9–3.7)
GLUCOSE: 84 mg/dL (ref 65–99)
POTASSIUM: 4.4 mmol/L (ref 3.5–5.3)
SODIUM: 140 mmol/L (ref 135–146)
TOTAL PROTEIN: 7.5 g/dL (ref 6.1–8.1)

## 2017-09-22 LAB — CBC WITH DIFFERENTIAL/PLATELET
BASOS ABS: 42 {cells}/uL (ref 0–200)
Basophils Relative: 0.7 %
EOS PCT: 0.7 %
Eosinophils Absolute: 42 cells/uL (ref 15–500)
HEMATOCRIT: 38.6 % (ref 38.5–50.0)
Hemoglobin: 13 g/dL — ABNORMAL LOW (ref 13.2–17.1)
Lymphs Abs: 2448 cells/uL (ref 850–3900)
MCH: 31.3 pg (ref 27.0–33.0)
MCHC: 33.7 g/dL (ref 32.0–36.0)
MCV: 93 fL (ref 80.0–100.0)
MONOS PCT: 13.1 %
MPV: 9.4 fL (ref 7.5–12.5)
NEUTROS PCT: 44.7 %
Neutro Abs: 2682 cells/uL (ref 1500–7800)
PLATELETS: 414 10*3/uL — AB (ref 140–400)
RBC: 4.15 10*6/uL — AB (ref 4.20–5.80)
RDW: 11 % (ref 11.0–15.0)
TOTAL LYMPHOCYTE: 40.8 %
WBC mixed population: 786 cells/uL (ref 200–950)
WBC: 6 10*3/uL (ref 3.8–10.8)

## 2017-09-22 LAB — T-HELPER CELL (CD4) - (RCID CLINIC ONLY)
CD4 % Helper T Cell: 20 % — ABNORMAL LOW (ref 33–55)
CD4 T Cell Abs: 530 /uL (ref 400–2700)

## 2017-09-25 LAB — HIV-1 RNA QUANT-NO REFLEX-BLD
HIV 1 RNA QUANT: DETECTED {copies}/mL — AB
HIV-1 RNA QUANT, LOG: DETECTED {Log_copies}/mL — AB

## 2017-09-27 ENCOUNTER — Other Ambulatory Visit: Payer: Self-pay | Admitting: Pharmacist Clinician (PhC)/ Clinical Pharmacy Specialist

## 2017-09-27 DIAGNOSIS — B20 Human immunodeficiency virus [HIV] disease: Secondary | ICD-10-CM

## 2017-09-27 MED ORDER — DARUN-COBIC-EMTRICIT-TENOFAF 800-150-200-10 MG PO TABS: 1.0000 | ORAL_TABLET | Freq: Every day | ORAL | 1 refills | Status: DC

## 2017-09-27 MED ORDER — DOLUTEGRAVIR SODIUM 50 MG PO TABS: 50.0000 mg | ORAL_TABLET | Freq: Two times a day (BID) | ORAL | 1 refills | Status: DC

## 2017-09-27 NOTE — Progress Notes (Signed)
Sending more refills until the appt on 5/22.

## 2017-10-18 ENCOUNTER — Ambulatory Visit: Payer: Medicaid Other | Admitting: Infectious Disease

## 2017-11-03 ENCOUNTER — Ambulatory Visit (INDEPENDENT_AMBULATORY_CARE_PROVIDER_SITE_OTHER): Payer: Medicaid Other | Admitting: Infectious Disease

## 2017-11-03 ENCOUNTER — Other Ambulatory Visit (HOSPITAL_COMMUNITY)
Admission: RE | Admit: 2017-11-03 | Discharge: 2017-11-03 | Disposition: A | Discharge status: Home / Self Care | Payer: Medicaid Other | Source: Ambulatory Visit | Attending: Infectious Disease | Admitting: Infectious Disease

## 2017-11-03 ENCOUNTER — Encounter: Payer: Self-pay | Admitting: Infectious Disease

## 2017-11-03 VITALS — BP 134/85 | HR 94 | Temp 98.1°F | Ht 71.0 in | Wt 140.0 lb

## 2017-11-03 DIAGNOSIS — I10 Essential (primary) hypertension: Secondary | ICD-10-CM | POA: Diagnosis not present

## 2017-11-03 DIAGNOSIS — B20 Human immunodeficiency virus [HIV] disease: Secondary | ICD-10-CM | POA: Insufficient documentation

## 2017-11-03 DIAGNOSIS — E349 Endocrine disorder, unspecified: Secondary | ICD-10-CM | POA: Diagnosis not present

## 2017-11-03 DIAGNOSIS — Z113 Encounter for screening for infections with a predominantly sexual mode of transmission: Secondary | ICD-10-CM | POA: Insufficient documentation

## 2017-11-03 DIAGNOSIS — H6120 Impacted cerumen, unspecified ear: Secondary | ICD-10-CM

## 2017-11-03 DIAGNOSIS — F640 Transsexualism: Secondary | ICD-10-CM

## 2017-11-03 DIAGNOSIS — IMO0001 Reserved for inherently not codable concepts without codable children: Secondary | ICD-10-CM

## 2017-11-03 DIAGNOSIS — Z23 Encounter for immunization: Secondary | ICD-10-CM

## 2017-11-03 DIAGNOSIS — Z7251 High risk heterosexual behavior: Secondary | ICD-10-CM

## 2017-11-03 DIAGNOSIS — H6123 Impacted cerumen, bilateral: Secondary | ICD-10-CM | POA: Diagnosis present

## 2017-11-03 DIAGNOSIS — Z789 Other specified health status: Secondary | ICD-10-CM

## 2017-11-03 HISTORY — DX: Impacted cerumen, unspecified ear: H61.20

## 2017-11-03 MED ORDER — DARUN-COBIC-EMTRICIT-TENOFAF 800-150-200-10 MG PO TABS: 1.0000 | ORAL_TABLET | Freq: Every day | ORAL | 11 refills | Status: DC

## 2017-11-03 MED ORDER — SPIRONOLACTONE 100 MG PO TABS: ORAL_TABLET | ORAL | 1 refills | Status: DC

## 2017-11-03 MED ORDER — ESTRADIOL VALERATE 20 MG/ML IM OIL: 10.0000 mg | TOPICAL_OIL | INTRAMUSCULAR | 2 refills | Status: DC

## 2017-11-03 MED ORDER — DOLUTEGRAVIR SODIUM 50 MG PO TABS: 50.0000 mg | ORAL_TABLET | Freq: Two times a day (BID) | ORAL | 11 refills | Status: DC

## 2017-11-03 NOTE — Progress Notes (Signed)
Subjective:   Chief complaint: She is complaining of difficulty hearing out of her left ear which is been going on for 3 weeks.   Patient ID: Stephen Griffin, adult    DOB: Oct 04, 1980, 37 y.o.   MRN: [id number]  HPI  Chocolate continues to do well taking her antiretrovirals.  Her viral load remains relatively well suppressed and her CD4 count is healthy.  She comes in today for a visit complaining of difficulty hearing in her left ear  .  She denies ear pain and denies fevers.  She is wondering if she can be given hormonal therapy again is also concerned about wanting to have more feminine appearing arms.      Past Medical History:  Diagnosis Date  . 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 Asthma   . DVT (deep venous thrombosis) ([place])    "LLE" (12/13/2012)  . Esophageal candidiasis ([place])    [name] 12/13/2012  . Excess ear wax 11/03/2017  . JJOACZYS(063.0)    "weekly" (12/13/2012)  . HIV (human immunodeficiency virus infection) ([place])   . Pneumonia    "once" (12/13/2012)  . Rash 04/10/2017  . 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 CHOLECYSTECTOMY N/A 12/17/2012   Procedure: LAPAROSCOPIC CHOLECYSTECTOMY WITH INTRAOPERATIVE CHOLANGIOGRAM;  Surgeon: Harl Bowie, MD;  Location: [place];  Service: General;  Laterality: N/A;  . ERCP N/A 12/14/2012   Procedure: ENDOSCOPIC RETROGRADE CHOLANGIOPANCREATOGRAPHY (ERCP);  Surgeon: Inda Castle, MD;  Location: [place];  Service: Gastroenterology;  Laterality: N/A;  . INCISE AND DRAIN ABCESS Left 2008   groin; psoas intraabdominal/notes 09/07/2006 (12/13/2012)  . INCISION AND DRAINAGE OF WOUND Left 03/2009   index finger    Family History  Problem Relation Age of Onset  . Hypertension Mother       Social History   Socioeconomic History  . Marital status: Single    Spouse name: Not on file  . Number of children: Not on file  . Years of  education: Not on file  . Highest education level: Not on file  Occupational History  . Occupation: Disability    Employer: UNEMPLOYED  Social Needs  . Financial resource strain: Not on file  . Food insecurity:    Worry: Not on file    Inability: Not on file  . Transportation needs:    Medical: Not on file    Non-medical: Not on file  Tobacco Use  . Smoking status: Current Every Day Smoker    Packs/day: 0.50    Years: 14.00    Pack years: 7.00    Types: Cigarettes  . Smokeless tobacco: Never Used  . Tobacco comment: 5 ciggs daily  Substance and Sexual Activity  . Alcohol use: Yes    Alcohol/week: 28.8 oz    Types: 48 Cans of beer per week    Comment: 12/13/2012 "2, 25oz beers/week"  . Drug use: No    Types: Marijuana    Comment: 12/13/2012 "couple joints/day"  . Sexual activity: Yes    Partners: Male    Comment: pt. given condoms  Lifestyle  . Physical activity:    Days per week: Not on file    Minutes per session: Not on file  . Stress: Not on file  Relationships  . Social connections:    Talks on phone: Not on file    Gets together: Not on file    Attends  religious service: Not on file    Active member of club or organization: Not on file    Attends meetings of clubs or organizations: Not on file    Relationship status: Not on file  Other Topics Concern  . Not on file  Social History Narrative   Currently living in [place] with father. Feels safe in the home; gets along with father. Is the youngest of 3 children, other siblings are medically well. Does not work; is on disability.    No Known Allergies   Current Outpatient Medications:  .  Darunavir-Cobicisctat-Emtricitabine-Tenofovir Alafenamide (SYMTUZA) 800-150-200-10 MG TABS, Take 1 tablet by mouth daily with breakfast., Disp: 30 tablet, Rfl: 11 .  dolutegravir (TIVICAY) 50 MG tablet, Take 1 tablet (50 mg total) by mouth 2 (two) times daily., Disp: 60 tablet, Rfl: 11 .  DULoxetine (CYMBALTA) 30  MG capsule, TAKE 1 CAPSULE BY MOUTH DAILY, Disp: 30 capsule, Rfl: 2 .  estradiol valerate (DELESTROGEN) 20 MG/ML injection, Inject 0.5 mLs (10 mg total) into the muscle every 14 (fourteen) days., Disp: 5 mL, Rfl: 2 .  spironolactone (ALDACTONE) 100 MG tablet, TAKE 1 AND 1/2 TABLETS BY MOUTH EVERY DAY, Disp: 45 tablet, Rfl: 1 .  Syringe/Needle, Disp, (SYRINGE 3CC/18GX1-1/2") 18G X 1-1/2" 3 ML MISC, Inject 1 Syringe as directed every 14 (fourteen) days., Disp: 100 each, Rfl: 2 .  dronabinol (MARINOL) 5 MG capsule, Take 1 capsule (5 mg total) by mouth 2 (two) times daily before a meal. (Patient not taking: Reported on 11/03/2017), Disp: 60 capsule, Rfl: 4 .  fluconazole (DIFLUCAN) 100 MG tablet, TAKE 1 TABLET BY MOUTH EVERY THURSDAY (Patient not taking: Reported on 11/03/2017), Disp: 15 tablet, Rfl: 3   Review of Systems  Constitutional: Negative for activity change, appetite change, chills, diaphoresis, fatigue, fever and unexpected weight change.  HENT: Negative for congestion, rhinorrhea, sinus pressure, sneezing, sore throat and trouble swallowing.   Eyes: Negative for photophobia and visual disturbance.  Respiratory: Negative for cough, chest tightness, shortness of breath, wheezing and stridor.   Cardiovascular: Negative for chest pain, palpitations and leg swelling.  Gastrointestinal: Negative for abdominal distention, abdominal pain, anal bleeding, blood in stool, constipation, diarrhea, nausea and vomiting.  Genitourinary: Negative for difficulty urinating, dysuria, flank pain and hematuria.  Musculoskeletal: Negative for arthralgias, back pain, gait problem, joint swelling and myalgias.  Skin: Negative for color change, pallor and wound.  Neurological: Negative for dizziness, tremors, weakness and light-headedness.  Hematological: Negative for adenopathy. Does not bruise/bleed easily.  Psychiatric/Behavioral: Negative for agitation, behavioral problems, confusion, decreased concentration,  dysphoric mood and sleep disturbance.       Objective:   Physical Exam  Constitutional: She is oriented to person, place, and time. She appears well-developed and well-nourished. No distress.  HENT:  Head: Normocephalic and atraumatic.  Mouth/Throat: No oropharyngeal exudate.  Both ears are full of copious amounts of wax including the right ear which is not having difficulty hearing out of.  Eyes: Conjunctivae and EOM are normal. No scleral icterus.  Neck: Normal range of motion. Neck supple.  Cardiovascular: Normal rate and regular rhythm.  Pulmonary/Chest: Effort normal. No respiratory distress. She has no wheezes.  Abdominal: She exhibits no distension.  Musculoskeletal: She exhibits no edema or tenderness.  Neurological: She is alert and oriented to person, place, and time. She exhibits normal muscle tone. Coordination normal.  Skin: Skin is warm and dry. No rash noted. She is not diaphoretic. No erythema. No pallor.  Psychiatric: She has a normal  mood and affect. Her behavior is normal. Judgment and thought content normal.         Assessment & Plan:    HIV disease: Recheck labs again and continue her regimen of TIVICAY BID and SYMTUZA daily  Difficulty hearing out of left ear: We will irrigate her ears with saline and see if this gets rid of her symptoms.  I have some concern that it potentially could be optic syphilis although this is not as likely given the wax buildup.  We will check an RPR to have her  STI screening: We will check for gonorrhea chlamydia in [place] genital sites.  Transgender identity: I will renew her dose of estradiol.  She was asking for a higher dose but I tried to explain to her that higher doses would only increase the risk of thrombotic thrombosis without improving feminization.  I encouraged her to continue taking her spironolactone.  Also offered the idea of referring her to an endocrinologist and considering testosterone blockade through more  aggressive means.  I spent greater than 25 minutes with the patient including greater than 50% of time in face to face counsel of the [place] re her HIV re how she is doing such a great job taking her ARV about U=U "undetectable = untramsnmissable" and in coordination of her care.

## 2017-11-06 LAB — RPR: RPR Ser Ql: REACTIVE — AB

## 2017-11-06 LAB — RPR TITER

## 2017-11-06 LAB — FLUORESCENT TREPONEMAL AB(FTA)-IGG-BLD: Fluorescent Treponemal ABS: REACTIVE — AB

## 2017-11-06 LAB — URINE CYTOLOGY ANCILLARY ONLY
Chlamydia: NEGATIVE
NEISSERIA GONORRHEA: NEGATIVE

## 2017-11-07 LAB — HIV-1 RNA QUANT-NO REFLEX-BLD
HIV 1 RNA Quant: 34 copies/mL — ABNORMAL HIGH
HIV-1 RNA Quant, Log: 1.53 Log copies/mL — ABNORMAL HIGH

## 2018-02-12 ENCOUNTER — Other Ambulatory Visit: Payer: Self-pay | Admitting: *Deleted

## 2018-02-12 DIAGNOSIS — B20 Human immunodeficiency virus [HIV] disease: Secondary | ICD-10-CM

## 2018-02-13 ENCOUNTER — Ambulatory Visit: Payer: Medicaid Other | Admitting: Infectious Disease

## 2018-03-20 ENCOUNTER — Other Ambulatory Visit: Payer: Self-pay

## 2018-03-20 ENCOUNTER — Other Ambulatory Visit: Payer: Medicaid Other

## 2018-03-20 DIAGNOSIS — Z79899 Other long term (current) drug therapy: Secondary | ICD-10-CM

## 2018-04-04 ENCOUNTER — Ambulatory Visit: Payer: Medicaid Other | Admitting: Infectious Disease

## 2018-08-28 ENCOUNTER — Encounter: Payer: Self-pay | Admitting: Pharmacy Technician

## 2018-08-31 ENCOUNTER — Telehealth: Payer: Self-pay | Admitting: Pharmacist

## 2018-08-31 NOTE — Telephone Encounter (Signed)
[facility] is unable to locate or contact patient for medication refills. Patient last received meds on 07/19/2018.

## 2018-11-19 ENCOUNTER — Other Ambulatory Visit: Payer: Medicaid Other

## 2018-12-11 ENCOUNTER — Telehealth: Payer: Self-pay | Admitting: *Deleted

## 2018-12-11 ENCOUNTER — Encounter: Payer: Medicaid Other | Admitting: Infectious Disease

## 2018-12-11 NOTE — Telephone Encounter (Signed)
Patient forgot about appointment today and wanted to reschedule.

## 2018-12-13 ENCOUNTER — Other Ambulatory Visit: Payer: Self-pay

## 2018-12-13 ENCOUNTER — Other Ambulatory Visit: Payer: Medicaid Other

## 2018-12-13 NOTE — Telephone Encounter (Signed)
Patient needs office visit with Dr Tommy Medal before any refills can be done.    Laverle Patter, RN

## 2018-12-18 ENCOUNTER — Other Ambulatory Visit: Payer: Self-pay

## 2018-12-18 ENCOUNTER — Other Ambulatory Visit: Payer: Medicaid Other

## 2018-12-18 DIAGNOSIS — B20 Human immunodeficiency virus [HIV] disease: Secondary | ICD-10-CM | POA: Diagnosis not present

## 2018-12-18 DIAGNOSIS — Z79899 Other long term (current) drug therapy: Secondary | ICD-10-CM | POA: Diagnosis not present

## 2018-12-19 LAB — T-HELPER CELL (CD4) - (RCID CLINIC ONLY)
CD4 % Helper T Cell: 20 % — ABNORMAL LOW (ref 33–65)
CD4 T Cell Abs: 383 /uL — ABNORMAL LOW (ref 400–1790)

## 2018-12-21 LAB — COMPLETE METABOLIC PANEL WITH GFR
AG Ratio: 1.2 (calc) (ref 1.0–2.5)
ALT: 28 U/L (ref 9–46)
AST: 33 U/L (ref 10–40)
Albumin: 4.5 g/dL (ref 3.6–5.1)
Alkaline phosphatase (APISO): 81 U/L (ref 36–130)
BUN/Creatinine Ratio: 5 (calc) — ABNORMAL LOW (ref 6–22)
BUN: 5 mg/dL — ABNORMAL LOW (ref 7–25)
CO2: 25 mmol/L (ref 20–32)
Calcium: 9.8 mg/dL (ref 8.6–10.3)
Chloride: 107 mmol/L (ref 98–110)
Creat: 1.05 mg/dL (ref 0.60–1.35)
GFR, Est African American: 104 mL/min/{1.73_m2} (ref >=60)
GFR, Est Non African American: 90 mL/min/{1.73_m2} (ref >=60)
Globulin: 3.8 g/dL (calc) — ABNORMAL HIGH (ref 1.9–3.7)
Glucose, Bld: 93 mg/dL (ref 65–99)
Potassium: 3.8 mmol/L (ref 3.5–5.3)
Sodium: 141 mmol/L (ref 135–146)
Total Bilirubin: 0.3 mg/dL (ref 0.2–1.2)
Total Protein: 8.3 g/dL — ABNORMAL HIGH (ref 6.1–8.1)

## 2018-12-21 LAB — HIV-1 RNA QUANT-NO REFLEX-BLD
HIV 1 RNA Quant: 13100 copies/mL — ABNORMAL HIGH
HIV-1 RNA Quant, Log: 4.12 Log copies/mL — ABNORMAL HIGH

## 2018-12-21 LAB — CBC WITH DIFFERENTIAL/PLATELET
Absolute Monocytes: 927 cells/uL (ref 200–950)
Basophils Absolute: 61 cells/uL (ref 0–200)
Basophils Relative: 1 %
Eosinophils Absolute: 18 cells/uL (ref 15–500)
Eosinophils Relative: 0.3 %
HCT: 45 % (ref 38.5–50.0)
Hemoglobin: 15.1 g/dL (ref 13.2–17.1)
Lymphs Abs: 2092 cells/uL (ref 850–3900)
MCH: 33 pg (ref 27.0–33.0)
MCHC: 33.6 g/dL (ref 32.0–36.0)
MCV: 98.5 fL (ref 80.0–100.0)
MPV: 9.3 fL (ref 7.5–12.5)
Monocytes Relative: 15.2 %
Neutro Abs: 3001 cells/uL (ref 1500–7800)
Neutrophils Relative %: 49.2 %
Platelets: 428 10*3/uL — ABNORMAL HIGH (ref 140–400)
RBC: 4.57 10*6/uL (ref 4.20–5.80)
RDW: 11.3 % (ref 11.0–15.0)
Total Lymphocyte: 34.3 %
WBC: 6.1 10*3/uL (ref 3.8–10.8)

## 2018-12-21 LAB — LIPID PANEL
Cholesterol: 120 mg/dL (ref <200)
HDL: 44 mg/dL (ref >=40)
LDL Cholesterol (Calc): 59 mg/dL (calc)
Non-HDL Cholesterol (Calc): 76 mg/dL (calc) (ref <130)
Total CHOL/HDL Ratio: 2.7 (calc) (ref <5.0)
Triglycerides: 86 mg/dL (ref <150)

## 2018-12-22 ENCOUNTER — Encounter (HOSPITAL_COMMUNITY): Payer: Self-pay | Admitting: Emergency Medicine

## 2018-12-22 ENCOUNTER — Emergency Department (HOSPITAL_COMMUNITY)
Admission: EM | Admit: 2018-12-22 | Discharge: 2018-12-22 | Disposition: A | Discharge status: Home / Self Care | Payer: Medicaid Other | Attending: Emergency Medicine | Admitting: Emergency Medicine

## 2018-12-22 ENCOUNTER — Other Ambulatory Visit: Payer: Self-pay

## 2018-12-22 DIAGNOSIS — S01412A Laceration without foreign body of left cheek and temporomandibular area, initial encounter: Secondary | ICD-10-CM | POA: Diagnosis not present

## 2018-12-22 DIAGNOSIS — J45909 Unspecified asthma, uncomplicated: Secondary | ICD-10-CM | POA: Diagnosis not present

## 2018-12-22 DIAGNOSIS — Y929 Unspecified place or not applicable: Secondary | ICD-10-CM | POA: Diagnosis not present

## 2018-12-22 DIAGNOSIS — Z79899 Other long term (current) drug therapy: Secondary | ICD-10-CM | POA: Diagnosis not present

## 2018-12-22 DIAGNOSIS — Y999 Unspecified external cause status: Secondary | ICD-10-CM | POA: Diagnosis not present

## 2018-12-22 DIAGNOSIS — B20 Human immunodeficiency virus [HIV] disease: Secondary | ICD-10-CM | POA: Diagnosis not present

## 2018-12-22 DIAGNOSIS — Y939 Activity, unspecified: Secondary | ICD-10-CM | POA: Insufficient documentation

## 2018-12-22 DIAGNOSIS — S0181XA Laceration without foreign body of other part of head, initial encounter: Secondary | ICD-10-CM

## 2018-12-22 DIAGNOSIS — F17210 Nicotine dependence, cigarettes, uncomplicated: Secondary | ICD-10-CM | POA: Insufficient documentation

## 2018-12-22 DIAGNOSIS — R5381 Other malaise: Secondary | ICD-10-CM | POA: Diagnosis not present

## 2018-12-22 DIAGNOSIS — R69 Illness, unspecified: Secondary | ICD-10-CM | POA: Diagnosis not present

## 2018-12-22 DIAGNOSIS — S0993XA Unspecified injury of face, initial encounter: Secondary | ICD-10-CM | POA: Diagnosis present

## 2018-12-22 MED ORDER — LIDOCAINE-EPINEPHRINE-TETRACAINE (LET) SOLUTION
3.0000 mL | Freq: Once | NASAL | Status: AC
  Administered 2018-12-22: 3 mL via TOPICAL
  Filled 2018-12-22: qty 3

## 2018-12-22 MED ORDER — LIDOCAINE-EPINEPHRINE 2 %-1:100000 IJ SOLN
10.0000 mL | Freq: Once | INTRAMUSCULAR | Status: AC
  Administered 2018-12-22: 10 mL via INTRADERMAL
  Filled 2018-12-22: qty 1

## 2018-12-22 NOTE — ED Notes (Signed)
Pt verbalized discharge instructions and follow up care. Alert and ambulatory. No IV.  

## 2018-12-22 NOTE — ED Notes (Signed)
Attempted to discharge pt, pt expressed concern about left thumb and would like provider to exam it before leaving. Provider made aware

## 2018-12-22 NOTE — ED Notes (Signed)
Suture cart and lac materials at bedside for provider

## 2018-12-22 NOTE — ED Notes (Signed)
Pt ambulatory from triage to room 25
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
## 2018-12-24 ENCOUNTER — Other Ambulatory Visit: Payer: Self-pay | Admitting: Infectious Disease

## 2018-12-24 DIAGNOSIS — B20 Human immunodeficiency virus [HIV] disease: Secondary | ICD-10-CM

## 2018-12-24 NOTE — Progress Notes (Signed)
Lab informed to add genotype.  Laverle Patter, RN

## 2018-12-27 ENCOUNTER — Other Ambulatory Visit: Payer: Self-pay

## 2018-12-27 NOTE — Telephone Encounter (Signed)
Please advise if refills are okay to send. [place]

## 2018-12-27 NOTE — Telephone Encounter (Signed)
-----   Message from [place] sent at 12/27/2018 10:35 AM EDT ----- Regarding: Prescription refill Contact: self- [phone] Patient called for prescription refill for Tivicay.Hasn't taken medication for the past 2 weeksPlease [facility].

## 2019-01-01 ENCOUNTER — Telehealth: Payer: Self-pay | Admitting: *Deleted

## 2019-01-01 NOTE — Telephone Encounter (Signed)
Patient called to ask if she could get refills on medication. Advised she will have to wait for her visit as the provider has added labs and needs those results to make his decision.

## 2019-01-04 LAB — HIV-1 GENOTYPING (RTI,PI,IN INHBTR): HIV-1 Genotype: DETECTED — AB

## 2019-01-14 ENCOUNTER — Ambulatory Visit (INDEPENDENT_AMBULATORY_CARE_PROVIDER_SITE_OTHER): Payer: Medicaid Other | Admitting: Infectious Disease

## 2019-01-14 ENCOUNTER — Encounter: Payer: Self-pay | Admitting: Infectious Disease

## 2019-01-14 ENCOUNTER — Other Ambulatory Visit: Payer: Self-pay

## 2019-01-14 VITALS — BP 145/91 | HR 87 | Temp 98.4°F

## 2019-01-14 DIAGNOSIS — L989 Disorder of the skin and subcutaneous tissue, unspecified: Secondary | ICD-10-CM

## 2019-01-14 DIAGNOSIS — B20 Human immunodeficiency virus [HIV] disease: Secondary | ICD-10-CM

## 2019-01-14 DIAGNOSIS — H00011 Hordeolum externum right upper eyelid: Secondary | ICD-10-CM

## 2019-01-14 DIAGNOSIS — H00019 Hordeolum externum unspecified eye, unspecified eyelid: Secondary | ICD-10-CM

## 2019-01-14 HISTORY — DX: Hordeolum externum unspecified eye, unspecified eyelid: H00.019

## 2019-01-14 HISTORY — DX: Disorder of the skin and subcutaneous tissue, unspecified: L98.9

## 2019-01-14 MED ORDER — SYMTUZA 800-150-200-10 MG PO TABS: 1.0000 | ORAL_TABLET | Freq: Every day | ORAL | 11 refills | Status: DC

## 2019-01-14 MED ORDER — DOLUTEGRAVIR SODIUM 50 MG PO TABS: 50.0000 mg | ORAL_TABLET | Freq: Two times a day (BID) | ORAL | 11 refills | Status: DC

## 2019-01-14 MED ORDER — DOXYCYCLINE HYCLATE 100 MG PO TABS: 100.0000 mg | ORAL_TABLET | Freq: Two times a day (BID) | ORAL | 1 refills | Status: AC
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
## 2019-01-17 ENCOUNTER — Telehealth: Payer: Self-pay | Admitting: Pharmacy Technician

## 2019-01-17 NOTE — Telephone Encounter (Signed)
[facility] Patient Advocate Encounter  [facility] reached out to [place] in reference to refilling the patient's medication from 08/17. I called the patient and she confirmed the address and asked that the three medications be shipped to the home address on file. Medications are set to arrive tomorrow 08/20 and the patient understood what she would be receiving and how to take them.

## 2019-01-30 ENCOUNTER — Ambulatory Visit: Payer: Medicaid Other | Admitting: Infectious Disease

## 2019-02-06 ENCOUNTER — Emergency Department (HOSPITAL_COMMUNITY)
Admission: EM | Admit: 2019-02-06 | Discharge: 2019-02-06 | Disposition: A | Discharge status: Home / Self Care | Payer: Medicaid Other | Attending: Emergency Medicine | Admitting: Emergency Medicine

## 2019-02-06 ENCOUNTER — Other Ambulatory Visit: Payer: Self-pay

## 2019-02-06 ENCOUNTER — Emergency Department (HOSPITAL_COMMUNITY): Payer: Medicaid Other

## 2019-02-06 ENCOUNTER — Encounter (HOSPITAL_COMMUNITY): Payer: Self-pay

## 2019-02-06 DIAGNOSIS — J45909 Unspecified asthma, uncomplicated: Secondary | ICD-10-CM | POA: Insufficient documentation

## 2019-02-06 DIAGNOSIS — Y929 Unspecified place or not applicable: Secondary | ICD-10-CM | POA: Insufficient documentation

## 2019-02-06 DIAGNOSIS — I10 Essential (primary) hypertension: Secondary | ICD-10-CM | POA: Insufficient documentation

## 2019-02-06 DIAGNOSIS — Z79899 Other long term (current) drug therapy: Secondary | ICD-10-CM | POA: Diagnosis not present

## 2019-02-06 DIAGNOSIS — S0180XA Unspecified open wound of other part of head, initial encounter: Secondary | ICD-10-CM | POA: Diagnosis not present

## 2019-02-06 DIAGNOSIS — S0990XA Unspecified injury of head, initial encounter: Secondary | ICD-10-CM | POA: Diagnosis present

## 2019-02-06 DIAGNOSIS — Y998 Other external cause status: Secondary | ICD-10-CM | POA: Insufficient documentation

## 2019-02-06 DIAGNOSIS — S0181XA Laceration without foreign body of other part of head, initial encounter: Secondary | ICD-10-CM | POA: Diagnosis not present

## 2019-02-06 DIAGNOSIS — S51012A Laceration without foreign body of left elbow, initial encounter: Secondary | ICD-10-CM | POA: Diagnosis not present

## 2019-02-06 DIAGNOSIS — W500XXA Accidental hit or strike by another person, initial encounter: Secondary | ICD-10-CM | POA: Diagnosis not present

## 2019-02-06 DIAGNOSIS — R220 Localized swelling, mass and lump, head: Secondary | ICD-10-CM | POA: Diagnosis not present

## 2019-02-06 DIAGNOSIS — S50312A Abrasion of left elbow, initial encounter: Secondary | ICD-10-CM | POA: Diagnosis not present

## 2019-02-06 DIAGNOSIS — Y9389 Activity, other specified: Secondary | ICD-10-CM | POA: Diagnosis not present

## 2019-02-06 DIAGNOSIS — S0993XA Unspecified injury of face, initial encounter: Secondary | ICD-10-CM | POA: Diagnosis not present

## 2019-02-06 DIAGNOSIS — B20 Human immunodeficiency virus [HIV] disease: Secondary | ICD-10-CM | POA: Diagnosis not present

## 2019-02-06 DIAGNOSIS — S59909A Unspecified injury of unspecified elbow, initial encounter: Secondary | ICD-10-CM | POA: Diagnosis not present

## 2019-02-06 DIAGNOSIS — F17210 Nicotine dependence, cigarettes, uncomplicated: Secondary | ICD-10-CM | POA: Diagnosis not present

## 2019-02-06 MED ORDER — HYDROCODONE-ACETAMINOPHEN 5-325 MG PO TABS: 1.0000 | ORAL_TABLET | Freq: Once | ORAL | Status: DC
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
## 2019-02-06 NOTE — ED Notes (Signed)
Patient verbalizes understanding of discharge instructions. Opportunity for questioning and answers were provided. Armband removed by staff, pt discharged from ED.  

## 2019-02-07 ENCOUNTER — Ambulatory Visit: Payer: Medicaid Other

## 2019-03-08 ENCOUNTER — Other Ambulatory Visit: Payer: Self-pay

## 2019-03-08 ENCOUNTER — Other Ambulatory Visit: Payer: Medicaid Other

## 2019-03-08 DIAGNOSIS — B20 Human immunodeficiency virus [HIV] disease: Secondary | ICD-10-CM

## 2019-03-08 DIAGNOSIS — Z113 Encounter for screening for infections with a predominantly sexual mode of transmission: Secondary | ICD-10-CM

## 2019-03-18 ENCOUNTER — Telehealth: Payer: Self-pay

## 2019-03-18 NOTE — Telephone Encounter (Signed)
COVID-19 Pre-Screening Questions:03/08/19   Do you currently have a fever (>100 F), chills or unexplained body aches?NO  Are you currently experiencing new cough, shortness of breath, sore throat, runny nose? NO .  Have you recently travelled outside the state of [place] in the last 14 days?NO .  Have you been in contact with someone that is currently pending confirmation of Covid19 testing or has been confirmed to have the [place] virus?  NO  **If the patient answers NO to ALL questions -  advise the patient to please call the clinic before coming to the office should any symptoms develop.

## 2019-03-19 ENCOUNTER — Ambulatory Visit: Payer: Medicaid Other | Admitting: Infectious Disease

## 2019-03-29 ENCOUNTER — Other Ambulatory Visit: Payer: Self-pay

## 2019-03-29 ENCOUNTER — Other Ambulatory Visit: Payer: Medicaid Other

## 2019-03-29 ENCOUNTER — Other Ambulatory Visit (HOSPITAL_COMMUNITY)
Admission: RE | Admit: 2019-03-29 | Discharge: 2019-03-29 | Disposition: A | Discharge status: Home / Self Care | Payer: Medicaid Other | Source: Ambulatory Visit | Attending: Infectious Disease | Admitting: Infectious Disease

## 2019-03-29 DIAGNOSIS — Z113 Encounter for screening for infections with a predominantly sexual mode of transmission: Secondary | ICD-10-CM | POA: Insufficient documentation

## 2019-03-29 DIAGNOSIS — B20 Human immunodeficiency virus [HIV] disease: Secondary | ICD-10-CM | POA: Insufficient documentation

## 2019-03-29 LAB — T-HELPER CELL (CD4) - (RCID CLINIC ONLY)
CD4 % Helper T Cell: 26 % — ABNORMAL LOW (ref 33–65)
CD4 T Cell Abs: 567 /uL (ref 400–1790)

## 2019-04-01 ENCOUNTER — Telehealth: Payer: Self-pay

## 2019-04-01 LAB — URINE CYTOLOGY ANCILLARY ONLY
Chlamydia: NEGATIVE
Comment: NEGATIVE
Comment: NORMAL
Neisseria Gonorrhea: NEGATIVE

## 2019-04-01 NOTE — Telephone Encounter (Signed)
Attempted to call patient per Dr. Tommy Medal regarding RPR lab. Patient will need to schedule nurse visit x3 weeks for bicillin 2.4 million units weekly injections. Unable to reach patient at this time nor leave [place], [place]

## 2019-04-01 NOTE — Telephone Encounter (Signed)
Second attempt to reach patient today was unsuccessful. Left voicemail requesting patient call office when available to review labs. [place]

## 2019-04-03 LAB — RPR TITER: RPR Titer: 1:16 {titer} — ABNORMAL HIGH

## 2019-04-03 LAB — FLUORESCENT TREPONEMAL AB(FTA)-IGG-BLD: Fluorescent Treponemal ABS: REACTIVE — AB

## 2019-04-03 LAB — HIV-1 RNA QUANT-NO REFLEX-BLD
HIV 1 RNA Quant: <20 copies/mL — AB
HIV-1 RNA Quant, Log: <1.3 Log copies/mL — AB

## 2019-04-03 LAB — RPR: RPR Ser Ql: REACTIVE — AB

## 2019-04-08 NOTE — Telephone Encounter (Signed)
DIS referral sent regarding positive RPR. Spoke with Gwenlyn Perking to make him aware patient has made 0 attempt to follow up on voicemail's to discuss lab results. Per Gwenlyn Perking this is a new infection and will follow up with patient.  [place]

## 2019-04-15 ENCOUNTER — Encounter: Payer: Medicaid Other | Admitting: Infectious Disease

## 2019-06-04 ENCOUNTER — Ambulatory Visit: Payer: Medicaid Other | Admitting: Infectious Disease

## 2019-07-25 ENCOUNTER — Other Ambulatory Visit: Payer: Self-pay

## 2019-07-25 ENCOUNTER — Ambulatory Visit (INDEPENDENT_AMBULATORY_CARE_PROVIDER_SITE_OTHER): Payer: Medicaid Other | Admitting: Infectious Disease

## 2019-07-25 ENCOUNTER — Other Ambulatory Visit (HOSPITAL_COMMUNITY)
Admission: RE | Admit: 2019-07-25 | Discharge: 2019-07-25 | Disposition: A | Discharge status: Home / Self Care | Payer: Medicaid Other | Source: Ambulatory Visit | Attending: Infectious Disease | Admitting: Infectious Disease

## 2019-07-25 VITALS — BP 136/85 | HR 102 | Temp 98.0°F | Wt 149.2 lb

## 2019-07-25 DIAGNOSIS — L989 Disorder of the skin and subcutaneous tissue, unspecified: Secondary | ICD-10-CM

## 2019-07-25 DIAGNOSIS — B20 Human immunodeficiency virus [HIV] disease: Secondary | ICD-10-CM | POA: Insufficient documentation

## 2019-07-25 DIAGNOSIS — F640 Transsexualism: Secondary | ICD-10-CM

## 2019-07-25 DIAGNOSIS — R21 Rash and other nonspecific skin eruption: Secondary | ICD-10-CM | POA: Diagnosis not present

## 2019-07-25 DIAGNOSIS — Z789 Other specified health status: Secondary | ICD-10-CM

## 2019-07-25 DIAGNOSIS — A5149 Other secondary syphilitic conditions: Secondary | ICD-10-CM

## 2019-07-25 MED ORDER — PENICILLIN G BENZATHINE 1200000 UNIT/2ML IM SUSP
1.2000 10*6.[IU] | Freq: Once | INTRAMUSCULAR | Status: AC
  Administered 2019-07-25: 12:00:00 1.2 10*6.[IU] via INTRAMUSCULAR

## 2019-07-25 MED ORDER — TRIAMCINOLONE ACETONIDE 0.5 % EX OINT: 1.0000 "application " | TOPICAL_OINTMENT | Freq: Two times a day (BID) | CUTANEOUS | 3 refills | Status: DC

## 2019-07-25 MED ORDER — DOLUTEGRAVIR SODIUM 50 MG PO TABS: 50.0000 mg | ORAL_TABLET | Freq: Two times a day (BID) | ORAL | 11 refills | Status: DC

## 2019-07-25 MED ORDER — SYMTUZA 800-150-200-10 MG PO TABS: 1.0000 | ORAL_TABLET | Freq: Every day | ORAL | 11 refills | Status: DC
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
## 2019-07-26 LAB — T-HELPER CELL (CD4) - (RCID CLINIC ONLY)
CD4 % Helper T Cell: 29 % — ABNORMAL LOW (ref 33–65)
CD4 T Cell Abs: 336 /uL — ABNORMAL LOW (ref 400–1790)

## 2019-07-26 LAB — CYTOLOGY, (ORAL, ANAL, URETHRAL) ANCILLARY ONLY
Chlamydia: NEGATIVE
Chlamydia: NEGATIVE
Comment: NEGATIVE
Comment: NEGATIVE
Comment: NORMAL
Comment: NORMAL
Neisseria Gonorrhea: NEGATIVE
Neisseria Gonorrhea: NEGATIVE

## 2019-07-26 LAB — URINE CYTOLOGY ANCILLARY ONLY
Chlamydia: NEGATIVE
Comment: NEGATIVE
Comment: NORMAL
Neisseria Gonorrhea: NEGATIVE

## 2019-07-29 LAB — HIV-1 RNA QUANT-NO REFLEX-BLD
HIV 1 RNA Quant: <20 copies/mL
HIV-1 RNA Quant, Log: <1.3 Log copies/mL

## 2019-07-29 LAB — CBC WITH DIFFERENTIAL/PLATELET
Absolute Monocytes: 880 cells/uL (ref 200–950)
Basophils Absolute: 38 cells/uL (ref 0–200)
Basophils Relative: 0.7 %
Eosinophils Absolute: 49 cells/uL (ref 15–500)
Eosinophils Relative: 0.9 %
HCT: 44.9 % (ref 38.5–50.0)
Hemoglobin: 15.1 g/dL (ref 13.2–17.1)
Lymphs Abs: 1112 cells/uL (ref 850–3900)
MCH: 32.8 pg (ref 27.0–33.0)
MCHC: 33.6 g/dL (ref 32.0–36.0)
MCV: 97.6 fL (ref 80.0–100.0)
MPV: 9.5 fL (ref 7.5–12.5)
Monocytes Relative: 16.3 %
Neutro Abs: 3321 cells/uL (ref 1500–7800)
Neutrophils Relative %: 61.5 %
Platelets: 362 10*3/uL (ref 140–400)
RBC: 4.6 10*6/uL (ref 4.20–5.80)
RDW: 11.5 % (ref 11.0–15.0)
Total Lymphocyte: 20.6 %
WBC: 5.4 10*3/uL (ref 3.8–10.8)

## 2019-07-29 LAB — COMPLETE METABOLIC PANEL WITH GFR
AG Ratio: 1.5 (calc) (ref 1.0–2.5)
ALT: 36 U/L (ref 9–46)
AST: 35 U/L (ref 10–40)
Albumin: 4.5 g/dL (ref 3.6–5.1)
Alkaline phosphatase (APISO): 86 U/L (ref 36–130)
BUN: 12 mg/dL (ref 7–25)
CO2: 26 mmol/L (ref 20–32)
Calcium: 9.8 mg/dL (ref 8.6–10.3)
Chloride: 105 mmol/L (ref 98–110)
Creat: 1.28 mg/dL (ref 0.60–1.35)
GFR, Est African American: 81 mL/min/{1.73_m2} (ref >=60)
GFR, Est Non African American: 70 mL/min/{1.73_m2} (ref >=60)
Globulin: 3.1 g/dL (calc) (ref 1.9–3.7)
Glucose, Bld: 84 mg/dL (ref 65–99)
Potassium: 4.1 mmol/L (ref 3.5–5.3)
Sodium: 139 mmol/L (ref 135–146)
Total Bilirubin: 0.3 mg/dL (ref 0.2–1.2)
Total Protein: 7.6 g/dL (ref 6.1–8.1)

## 2019-07-29 LAB — LIPID PANEL
Cholesterol: 131 mg/dL (ref <200)
HDL: 47 mg/dL (ref >=40)
LDL Cholesterol (Calc): 65 mg/dL (calc)
Non-HDL Cholesterol (Calc): 84 mg/dL (calc) (ref <130)
Total CHOL/HDL Ratio: 2.8 (calc) (ref <5.0)
Triglycerides: 107 mg/dL (ref <150)

## 2019-07-29 LAB — FLUORESCENT TREPONEMAL AB(FTA)-IGG-BLD: Fluorescent Treponemal ABS: REACTIVE — AB

## 2019-07-29 LAB — RPR: RPR Ser Ql: REACTIVE — AB

## 2019-07-29 LAB — RPR TITER: RPR Titer: 1:4 {titer} — ABNORMAL HIGH

## 2019-08-01 ENCOUNTER — Ambulatory Visit (INDEPENDENT_AMBULATORY_CARE_PROVIDER_SITE_OTHER): Payer: Medicaid Other | Admitting: *Deleted

## 2019-08-01 ENCOUNTER — Other Ambulatory Visit: Payer: Self-pay

## 2019-08-01 DIAGNOSIS — A5149 Other secondary syphilitic conditions: Secondary | ICD-10-CM | POA: Diagnosis not present

## 2019-08-01 MED ORDER — PENICILLIN G BENZATHINE 1200000 UNIT/2ML IM SUSP
1.2000 10*6.[IU] | Freq: Once | INTRAMUSCULAR | Status: AC
  Administered 2019-08-01: 1.2 10*6.[IU] via INTRAMUSCULAR

## 2019-08-01 NOTE — Progress Notes (Signed)
RN offered condoms, advised patient to remain abstinent for 7-10 days after treatme nt, and that the health department would be following up with her to confirm treatment. Patient's questions answered to their satisfaction. Landis Gandy, RN

## 2019-08-08 ENCOUNTER — Telehealth: Payer: Self-pay

## 2019-08-08 ENCOUNTER — Other Ambulatory Visit: Payer: Self-pay

## 2019-08-08 ENCOUNTER — Ambulatory Visit: Payer: Medicaid Other

## 2019-08-08 ENCOUNTER — Ambulatory Visit (INDEPENDENT_AMBULATORY_CARE_PROVIDER_SITE_OTHER): Payer: Medicaid Other

## 2019-08-08 DIAGNOSIS — A5149 Other secondary syphilitic conditions: Secondary | ICD-10-CM | POA: Diagnosis not present

## 2019-08-08 MED ORDER — PENICILLIN G BENZATHINE 1200000 UNIT/2ML IM SUSP
1.2000 10*6.[IU] | Freq: Once | INTRAMUSCULAR | Status: AC
  Administered 2019-08-08: 1.2 10*6.[IU] via INTRAMUSCULAR

## 2019-08-08 MED ORDER — PENICILLIN G BENZATHINE 1200000 UNIT/2ML IM SUSP
1.2000 10*6.[IU] | Freq: Once | INTRAMUSCULAR | Status: AC
  Administered 2019-08-08: 16:00:00 1.2 10*6.[IU] via INTRAMUSCULAR

## 2019-08-08 NOTE — Telephone Encounter (Signed)
Patient called office today to reschedule appointment for Bicillin #3. States she is in [place] and cannot make it this morning. Rescheduled for appointment later this afternoon at 4. Patient states she will be back in [place] by then. [place]

## 2019-10-01 DIAGNOSIS — K029 Dental caries, unspecified: Secondary | ICD-10-CM | POA: Diagnosis not present

## 2019-10-03 ENCOUNTER — Telehealth: Payer: Self-pay

## 2019-10-03 NOTE — Telephone Encounter (Signed)
COVID-19 Pre-Screening Questions:10/03/19   Do you currently have a fever (>100 F), chills or unexplained body aches? NO  Are you currently experiencing new cough, shortness of breath, sore throat, runny nose?NO  .  Have you recently travelled outside the state of [place] in the last 14 days?NO  .  Have you been in contact with someone that is currently pending confirmation of Covid19 testing or has been confirmed to have the [place] virus? NO   **If the patient answers NO to ALL questions -  advise the patient to please call the clinic before coming to the office should any symptoms develop.

## 2019-10-04 ENCOUNTER — Ambulatory Visit: Payer: Medicaid Other | Admitting: Infectious Diseases
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
## 2019-10-22 ENCOUNTER — Ambulatory Visit: Payer: Medicaid Other | Admitting: Infectious Disease

## 2019-11-25 ENCOUNTER — Other Ambulatory Visit: Payer: Self-pay

## 2019-11-25 DIAGNOSIS — Z113 Encounter for screening for infections with a predominantly sexual mode of transmission: Secondary | ICD-10-CM

## 2019-11-25 DIAGNOSIS — B20 Human immunodeficiency virus [HIV] disease: Secondary | ICD-10-CM

## 2019-11-25 DIAGNOSIS — Z79899 Other long term (current) drug therapy: Secondary | ICD-10-CM

## 2019-11-26 ENCOUNTER — Other Ambulatory Visit: Payer: Medicaid Other

## 2019-11-26 ENCOUNTER — Other Ambulatory Visit (HOSPITAL_COMMUNITY)
Admission: RE | Admit: 2019-11-26 | Discharge: 2019-11-26 | Disposition: A | Discharge status: Home / Self Care | Payer: Medicaid Other | Source: Ambulatory Visit | Attending: Infectious Disease | Admitting: Infectious Disease

## 2019-11-26 ENCOUNTER — Other Ambulatory Visit: Payer: Self-pay

## 2019-11-26 DIAGNOSIS — Z79899 Other long term (current) drug therapy: Secondary | ICD-10-CM | POA: Diagnosis not present

## 2019-11-26 DIAGNOSIS — Z113 Encounter for screening for infections with a predominantly sexual mode of transmission: Secondary | ICD-10-CM

## 2019-11-26 DIAGNOSIS — B20 Human immunodeficiency virus [HIV] disease: Secondary | ICD-10-CM | POA: Insufficient documentation

## 2019-11-27 LAB — URINE CYTOLOGY ANCILLARY ONLY
Chlamydia: NEGATIVE
Comment: NEGATIVE
Comment: NORMAL
Neisseria Gonorrhea: NEGATIVE

## 2019-11-27 LAB — T-HELPER CELL (CD4) - (RCID CLINIC ONLY)
CD4 % Helper T Cell: 30 % — ABNORMAL LOW (ref 33–65)
CD4 T Cell Abs: 728 /uL (ref 400–1790)

## 2019-11-29 LAB — CBC WITH DIFFERENTIAL/PLATELET
Absolute Monocytes: 747 cells/uL (ref 200–950)
Basophils Absolute: 59 cells/uL (ref 0–200)
Basophils Relative: 0.8 %
Eosinophils Absolute: 118 cells/uL (ref 15–500)
Eosinophils Relative: 1.6 %
HCT: 46 % (ref 38.5–50.0)
Hemoglobin: 15.3 g/dL (ref 13.2–17.1)
Lymphs Abs: 2590 cells/uL (ref 850–3900)
MCH: 32.8 pg (ref 27.0–33.0)
MCHC: 33.3 g/dL (ref 32.0–36.0)
MCV: 98.5 fL (ref 80.0–100.0)
MPV: 9.6 fL (ref 7.5–12.5)
Monocytes Relative: 10.1 %
Neutro Abs: 3885 cells/uL (ref 1500–7800)
Neutrophils Relative %: 52.5 %
Platelets: 388 10*3/uL (ref 140–400)
RBC: 4.67 10*6/uL (ref 4.20–5.80)
RDW: 11.7 % (ref 11.0–15.0)
Total Lymphocyte: 35 %
WBC: 7.4 10*3/uL (ref 3.8–10.8)

## 2019-11-29 LAB — LIPID PANEL
Cholesterol: 150 mg/dL (ref <200)
HDL: 50 mg/dL (ref >=40)
LDL Cholesterol (Calc): 64 mg/dL (calc)
Non-HDL Cholesterol (Calc): 100 mg/dL (calc) (ref <130)
Total CHOL/HDL Ratio: 3 (calc) (ref <5.0)
Triglycerides: 295 mg/dL — ABNORMAL HIGH (ref <150)

## 2019-11-29 LAB — COMPREHENSIVE METABOLIC PANEL
AG Ratio: 1.3 (calc) (ref 1.0–2.5)
ALT: 33 U/L (ref 9–46)
AST: 32 U/L (ref 10–40)
Albumin: 4.4 g/dL (ref 3.6–5.1)
Alkaline phosphatase (APISO): 83 U/L (ref 36–130)
BUN: 15 mg/dL (ref 7–25)
CO2: 25 mmol/L (ref 20–32)
Calcium: 10.5 mg/dL — ABNORMAL HIGH (ref 8.6–10.3)
Chloride: 104 mmol/L (ref 98–110)
Creat: 1.28 mg/dL (ref 0.60–1.35)
Globulin: 3.4 g/dL (calc) (ref 1.9–3.7)
Glucose, Bld: 76 mg/dL (ref 65–99)
Potassium: 4.1 mmol/L (ref 3.5–5.3)
Sodium: 140 mmol/L (ref 135–146)
Total Bilirubin: 0.4 mg/dL (ref 0.2–1.2)
Total Protein: 7.8 g/dL (ref 6.1–8.1)

## 2019-11-29 LAB — RPR: RPR Ser Ql: REACTIVE — AB

## 2019-11-29 LAB — FLUORESCENT TREPONEMAL AB(FTA)-IGG-BLD: Fluorescent Treponemal ABS: REACTIVE — AB

## 2019-11-29 LAB — RPR TITER: RPR Titer: 1:2 {titer} — ABNORMAL HIGH

## 2019-11-29 LAB — HIV-1 RNA QUANT-NO REFLEX-BLD
HIV 1 RNA Quant: <20 copies/mL
HIV-1 RNA Quant, Log: <1.3 Log copies/mL

## 2019-12-24 ENCOUNTER — Ambulatory Visit (INDEPENDENT_AMBULATORY_CARE_PROVIDER_SITE_OTHER): Payer: Medicaid Other | Admitting: Infectious Diseases

## 2019-12-24 ENCOUNTER — Other Ambulatory Visit: Payer: Self-pay

## 2019-12-24 ENCOUNTER — Encounter: Payer: Self-pay | Admitting: Infectious Diseases

## 2019-12-24 DIAGNOSIS — E349 Endocrine disorder, unspecified: Secondary | ICD-10-CM

## 2019-12-24 DIAGNOSIS — I10 Essential (primary) hypertension: Secondary | ICD-10-CM | POA: Diagnosis not present

## 2019-12-24 DIAGNOSIS — Z7251 High risk heterosexual behavior: Secondary | ICD-10-CM | POA: Diagnosis not present

## 2019-12-24 DIAGNOSIS — F640 Transsexualism: Secondary | ICD-10-CM

## 2019-12-24 DIAGNOSIS — B20 Human immunodeficiency virus [HIV] disease: Secondary | ICD-10-CM | POA: Diagnosis not present

## 2019-12-24 DIAGNOSIS — B349 Viral infection, unspecified: Secondary | ICD-10-CM | POA: Diagnosis not present

## 2019-12-24 DIAGNOSIS — IMO0001 Reserved for inherently not codable concepts without codable children: Secondary | ICD-10-CM

## 2019-12-24 DIAGNOSIS — Z789 Other specified health status: Secondary | ICD-10-CM

## 2019-12-24 MED ORDER — "SYRINGE 18G X 1-1/2"" 3 ML MISC": 1.0000 | 2 refills | Status: DC

## 2019-12-24 MED ORDER — SPIRONOLACTONE 100 MG PO TABS: ORAL_TABLET | ORAL | 1 refills | Status: DC

## 2019-12-24 MED ORDER — ESTRADIOL VALERATE 20 MG/ML IM OIL: 10.0000 mg | TOPICAL_OIL | INTRAMUSCULAR | 2 refills | Status: DC

## 2019-12-24 NOTE — Progress Notes (Signed)
Name: Stephen Griffin  DOB: 1981/02/20 MRN: [id number] PCP: Tommy Medal, Lavell Islam, MD    Patient Active Problem List   Diagnosis Date Noted  . 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 HTN (hypertension) 07/28/2014  . 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 Acute biliary pancreatitis 12/15/2012  . Calculus of bile duct without mention of cholecystitis or obstruction 12/14/2012  . Nicotine dependence 12/13/2012  . Abdominal pain 12/13/2012  . Polysubstance abuse ([place]) 12/13/2012  . Cigarette smoker 12/13/2012  . Alcohol abuse 11/21/2012  . Pancreatitis 11/21/2012  . Elevated alkaline phosphatase level 11/21/2012  . Transaminitis 11/21/2012  . Leukopenia 11/21/2012  . Hypokalemia 11/21/2012  . BRBPR (bright red blood per rectum) 04/11/2012  . MOLLUSCUM CONTAGIOSUM 06/24/2010  . PERIODONTAL DISEASE 05/10/2010  . INSOMNIA UNSPECIFIED 05/10/2010  . HYPOKALEMIA, MILD 03/18/2010  . Esophageal candidiasis ([place]) 03/08/2010  . BLURRED VISION 03/08/2010  . NIGHT SWEATS 03/08/2010  . COUGH 03/08/2010  . WASTING SYNDROME 03/08/2010  . 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 PERIPHERAL NEUROPATHY 04/09/2008  . Human immunodeficiency virus (HIV) disease ([place]) 03/22/2006  . PNEUMOCYSTIS PNEUMONIA 03/22/2006  . ASTHMA 03/22/2006    Subjective:   Chief Complaint  Patient presents with  . Follow-up    B20     HPI: Here for follow up on HIV and wants to discuss resuming her hormone affirming therapy.  She has been doing better with her Symtuza + Tivicay daily with food and tries not to miss doses. She missed 2 doses over the course of this last week.  She is not taking any other mediations currently.  One regular male partner. No symptoms concerning for STI today. Recent screening negative.   VL < 20  CD4 728 RPR 1:4 (stable)   Depression screen [facility] 2/9 12/24/2019  Decreased Interest 0  Down, Depressed, Hopeless 0  PHQ - 2 Score 0  Some recent data  might be hidden    Review of Systems  Constitutional: Negative for chills and fever.  HENT: Negative for sore throat.   Eyes: Negative for visual disturbance.  Gastrointestinal: Negative for abdominal pain, anal bleeding and rectal pain.  Genitourinary: Negative for dysuria and genital sores.  Musculoskeletal: Negative for arthralgias and joint swelling.  Skin: Negative for rash.  Neurological: Negative for headaches.  Hematological: Negative for adenopathy.     Past Medical History:  Diagnosis Date  . 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 Asthma   . DVT (deep venous thrombosis) ([place])    "LLE" (12/13/2012)  . Esophageal candidiasis ([place])    [name] 12/13/2012  . Excess ear wax 11/03/2017  . Finger lesion 01/14/2019  . OIZTIWPY(099.8)    "weekly" (12/13/2012)  . HIV (human immunodeficiency virus infection) ([place])   . Pneumonia    "once" (12/13/2012)  . Rash 04/10/2017  . 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 Stye 01/14/2019    Outpatient Medications Prior to Visit  Medication Sig Dispense Refill  . Darunavir-Cobicisctat-Emtricitabine-Tenofovir Alafenamide (SYMTUZA) 800-150-200-10 MG TABS Take 1 tablet by mouth daily with breakfast. 30 tablet 11  . dolutegravir (TIVICAY) 50 MG tablet Take 1 tablet (50 mg total) by mouth 2 (two) times daily. 60 tablet 11  . dronabinol (MARINOL) 5 MG capsule Take 1 capsule (5 mg total) by mouth 2 (two) times daily before a meal. 60 capsule 4  . DULoxetine (CYMBALTA) 30 MG capsule TAKE 1 CAPSULE BY MOUTH DAILY 30 capsule 2  . fluconazole (DIFLUCAN) 100 MG tablet TAKE 1 TABLET BY MOUTH EVERY THURSDAY 15  tablet 3  . spironolactone (ALDACTONE) 100 MG tablet TAKE 1 AND 1/2 TABLETS BY MOUTH EVERY DAY 45 tablet 1  . Syringe/Needle, Disp, (SYRINGE 3CC/18GX1-1/2") 18G X 1-1/2" 3 ML MISC Inject 1 Syringe as directed every 14 (fourteen) days. 100 each 2  . triamcinolone ointment (KENALOG) 0.5 % Apply 1 application topically 2 (two) times daily. (Patient not taking:  Reported on 12/24/2019) 30 g 3  . estradiol valerate (DELESTROGEN) 20 MG/ML injection Inject 0.5 mLs (10 mg total) into the muscle every 14 (fourteen) days. (Patient not taking: Reported on 12/22/2018) 5 mL 2   No facility-administered medications prior to visit.     No Known Allergies  Social History   Tobacco Use  . Smoking status: Current Every Day Smoker    Packs/day: 0.50    Years: 14.00    Pack years: 7.00    Types: Cigarettes  . Smokeless tobacco: Never Used  . Tobacco comment: 5 ciggs daily  Vaping Use  . Vaping Use: Never used  Substance Use Topics  . Alcohol use: Not Currently    Alcohol/week: 48.0 standard drinks    Types: 48 Cans of beer per week    Comment: 12/13/2012 "2, 25oz beers/week"  . Drug use: No    Types: Marijuana    Comment: 12/13/2012 "couple joints/day"    Family History  Problem Relation Age of Onset  . Hypertension Mother     Social History   Substance and Sexual Activity  Sexual Activity Not Currently  . Partners: Male   Comment: pt. given condoms     Objective:   Vitals:   12/24/19 1043  BP: (!) 138/94  Pulse: 82  Temp: 98.1 F (36.7 C)  TempSrc: Oral  Weight: 152 lb (68.9 kg)   Body mass index is 21.2 kg/m.  Physical Exam Constitutional:      Appearance: She is well-developed.     Comments: Seated comfortably in chair.   HENT:     Mouth/Throat:     Mouth: No oral lesions.     Dentition: Normal dentition. No dental abscesses.     Pharynx: No oropharyngeal exudate.  Cardiovascular:     Rate and Rhythm: Normal rate and regular rhythm.     Heart sounds: Normal heart sounds.  Pulmonary:     Effort: Pulmonary effort is normal.     Breath sounds: Normal breath sounds.  Abdominal:     General: There is no distension.     Palpations: Abdomen is soft.     Tenderness: There is no abdominal tenderness.  Lymphadenopathy:     Cervical: No cervical adenopathy.  Skin:    General: Skin is warm and dry.     Findings: No rash.    Neurological:     Mental Status: She is alert and oriented to person, place, and time.  Psychiatric:        Judgment: Judgment normal.     Comments: In good spirits today and engaged in care discussion     Lab Results Lab Results  Component Value Date   WBC 7.4 11/26/2019   HGB 15.3 11/26/2019   HCT 46.0 11/26/2019   MCV 98.5 11/26/2019   PLT 388 11/26/2019    Lab Results  Component Value Date   CREATININE 1.28 11/26/2019   BUN 15 11/26/2019   NA 140 11/26/2019   K 4.1 11/26/2019   CL 104 11/26/2019   CO2 25 11/26/2019    Lab Results  Component Value Date   ALT 33  11/26/2019   AST 32 11/26/2019   ALKPHOS 89 01/05/2017   BILITOT 0.4 11/26/2019    Lab Results  Component Value Date   CHOL 150 11/26/2019   HDL 50 11/26/2019   LDLCALC 64 11/26/2019   TRIG 295 (H) 11/26/2019   CHOLHDL 3.0 11/26/2019   HIV 1 RNA Quant (copies/mL)  Date Value  11/26/2019 <20 NOT DETECTED  07/25/2019 <20 NOT DETECTED  03/29/2019 <20 DETECTED (A)   CD4 T Cell Abs (/uL)  Date Value  11/26/2019 728  07/25/2019 336 (L)  03/29/2019 567     Assessment & Plan:   Problem List Items Addressed This Visit      Unprioritized   HTN (hypertension)   Relevant Medications   spironolactone (ALDACTONE) 100 MG tablet   Human immunodeficiency virus (HIV) disease ([place])    She has done a good job re-prioritizing ARVs from what I can see. I congratulated her. We spent time reviewing her labs to understanding.  Continue Symtuza and Tivicay once daily with meals.  Will have her back in 2-3 months to see Dr. Tommy Medal since we are starting hormone therapy back.       Relevant Medications   spironolactone (ALDACTONE) 100 MG tablet   RESOLVED: AIDS (HCC)   Relevant Medications   spironolactone (ALDACTONE) 100 MG tablet   Transgender    Resume Estradiol injections 10 mg Q2w IM + Spironolactone. Will do Spiro at 100 mg QD to ensure she can tolerate and increase back at return visit with labs to  check Estradiol, Testosterone and CMP.  She can verbalize IM injection technique but I asked her to call back to see one of our clinic nurses, pharmacists or myself to help with injections should she need guidance.        Other Visit Diagnoses    High risk sexual behavior       Relevant Medications   spironolactone (ALDACTONE) 100 MG tablet   Accelerated hypertension       Relevant Medications   spironolactone (ALDACTONE) 100 MG tablet   Hormonal imbalance in transgender patient       Relevant Medications   estradiol valerate (DELESTROGEN) 20 MG/ML injection      Janene Madeira, MSN, NP-C [facility] for Infectious [place] Pager: [phone] Office: [phone]  12/27/19  2:19 PM

## 2019-12-24 NOTE — Patient Instructions (Signed)
Please come back to see Dr. Tommy Medal in 2 months to check in with resuming your hormones   Please continue taking your Tivicay and Symtuza every night with food. Do not miss any doses!

## 2019-12-27 ENCOUNTER — Encounter: Payer: Self-pay | Admitting: Infectious Diseases

## 2019-12-27 NOTE — Assessment & Plan Note (Signed)
She has done a good job re-prioritizing ARVs from what I can see. I congratulated her. We spent time reviewing her labs to understanding.  Continue Symtuza and Tivicay once daily with meals.  Will have her back in 2-3 months to see Dr. Tommy Medal since we are starting hormone therapy back.

## 2019-12-27 NOTE — Assessment & Plan Note (Addendum)
Resume Estradiol injections 10 mg Q2w IM + Spironolactone. Will do Spiro at 100 mg QD to ensure she can tolerate and increase back at return visit with labs to check Estradiol, Testosterone and CMP.  She can verbalize IM injection technique but I asked her to call back to see one of our clinic nurses, pharmacists or myself to help with injections should she need guidance.

## 2020-02-14 ENCOUNTER — Other Ambulatory Visit: Payer: Self-pay | Admitting: Infectious Diseases

## 2020-02-14 DIAGNOSIS — I10 Essential (primary) hypertension: Secondary | ICD-10-CM

## 2020-02-14 DIAGNOSIS — B20 Human immunodeficiency virus [HIV] disease: Secondary | ICD-10-CM

## 2020-02-14 DIAGNOSIS — Z7251 High risk heterosexual behavior: Secondary | ICD-10-CM

## 2020-02-20 ENCOUNTER — Ambulatory Visit: Payer: Medicaid Other | Admitting: Infectious Disease

## 2020-02-25 ENCOUNTER — Ambulatory Visit: Payer: Medicaid Other | Admitting: Infectious Disease

## 2020-03-30 ENCOUNTER — Other Ambulatory Visit: Payer: Self-pay

## 2020-03-30 ENCOUNTER — Encounter: Payer: Self-pay | Admitting: Infectious Disease

## 2020-03-30 ENCOUNTER — Other Ambulatory Visit (HOSPITAL_COMMUNITY): Payer: Self-pay | Admitting: Infectious Disease

## 2020-03-30 ENCOUNTER — Other Ambulatory Visit (HOSPITAL_COMMUNITY)
Admission: RE | Admit: 2020-03-30 | Discharge: 2020-03-30 | Disposition: A | Discharge status: Home / Self Care | Payer: Medicaid Other | Source: Ambulatory Visit | Attending: Infectious Disease | Admitting: Infectious Disease

## 2020-03-30 ENCOUNTER — Ambulatory Visit (INDEPENDENT_AMBULATORY_CARE_PROVIDER_SITE_OTHER): Payer: Medicaid Other | Admitting: Infectious Disease

## 2020-03-30 VITALS — BP 152/104 | HR 109 | Wt 152.0 lb

## 2020-03-30 DIAGNOSIS — E349 Endocrine disorder, unspecified: Secondary | ICD-10-CM

## 2020-03-30 DIAGNOSIS — B20 Human immunodeficiency virus [HIV] disease: Secondary | ICD-10-CM | POA: Diagnosis not present

## 2020-03-30 DIAGNOSIS — Z23 Encounter for immunization: Secondary | ICD-10-CM

## 2020-03-30 DIAGNOSIS — I10 Essential (primary) hypertension: Secondary | ICD-10-CM

## 2020-03-30 DIAGNOSIS — A5149 Other secondary syphilitic conditions: Secondary | ICD-10-CM

## 2020-03-30 DIAGNOSIS — Z7251 High risk heterosexual behavior: Secondary | ICD-10-CM

## 2020-03-30 DIAGNOSIS — IMO0001 Reserved for inherently not codable concepts without codable children: Secondary | ICD-10-CM

## 2020-03-30 DIAGNOSIS — Z789 Other specified health status: Secondary | ICD-10-CM

## 2020-03-30 MED ORDER — SYRINGE 18G X 1-1/2" 3 ML MISC
1.0000 | 2 refills | Status: DC
Start: 2020-03-30 — End: 2022-03-12

## 2020-03-30 MED ORDER — SYMTUZA 800-150-200-10 MG PO TABS: 1.0000 | ORAL_TABLET | Freq: Every day | ORAL | 11 refills | Status: DC

## 2020-03-30 MED ORDER — ESTRADIOL VALERATE 20 MG/ML IM OIL: 10.0000 mg | TOPICAL_OIL | INTRAMUSCULAR | 2 refills | Status: DC

## 2020-03-30 MED ORDER — DOLUTEGRAVIR SODIUM 50 MG PO TABS: 50.0000 mg | ORAL_TABLET | Freq: Two times a day (BID) | ORAL | 11 refills | Status: DC

## 2020-03-30 MED ORDER — DOXYCYCLINE HYCLATE 100 MG PO TABS: 200.0000 mg | ORAL_TABLET | ORAL | 4 refills | Status: DC

## 2020-03-30 MED ORDER — SPIRONOLACTONE 100 MG PO TABS: ORAL_TABLET | ORAL | 11 refills | Status: DC
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
## 2020-03-31 LAB — URINE CYTOLOGY ANCILLARY ONLY
Chlamydia: NEGATIVE
Comment: NEGATIVE
Comment: NORMAL
Neisseria Gonorrhea: NEGATIVE

## 2020-03-31 LAB — CYTOLOGY, (ORAL, ANAL, URETHRAL) ANCILLARY ONLY
Chlamydia: NEGATIVE
Chlamydia: POSITIVE — AB
Comment: NEGATIVE
Comment: NEGATIVE
Comment: NORMAL
Comment: NORMAL
Neisseria Gonorrhea: NEGATIVE
Neisseria Gonorrhea: NEGATIVE

## 2020-03-31 LAB — T-HELPER CELL (CD4) - (RCID CLINIC ONLY)
CD4 % Helper T Cell: 31 % — ABNORMAL LOW (ref 33–65)
CD4 T Cell Abs: 539 /uL (ref 400–1790)

## 2020-04-01 ENCOUNTER — Telehealth: Payer: Self-pay

## 2020-04-01 NOTE — Telephone Encounter (Signed)
-----   Message from Truman Hayward, MD sent at 04/01/2020  9:36 AM EDT ----- Chocolate needs to take doxycline 100mg  bid x 21 days partners tested and treated

## 2020-04-01 NOTE — Telephone Encounter (Signed)
Attempted to reach patient regarding most recent lab results. Per Dr. Tommy Medal patient needs chlamydia treatment with doxycyline 100mg  twice daily for 21 days total. Need to updated patient and confirm pharmacy.  Eugenia Mcalpine

## 2020-04-02 LAB — CBC WITH DIFFERENTIAL/PLATELET
Absolute Monocytes: 759 cells/uL (ref 200–950)
Basophils Absolute: 62 cells/uL (ref 0–200)
Basophils Relative: 1.2 %
Eosinophils Absolute: 31 cells/uL (ref 15–500)
Eosinophils Relative: 0.6 %
HCT: 43.9 % (ref 38.5–50.0)
Hemoglobin: 15 g/dL (ref 13.2–17.1)
Lymphs Abs: 1919 cells/uL (ref 850–3900)
MCH: 33.3 pg — ABNORMAL HIGH (ref 27.0–33.0)
MCHC: 34.2 g/dL (ref 32.0–36.0)
MCV: 97.3 fL (ref 80.0–100.0)
MPV: 9.7 fL (ref 7.5–12.5)
Monocytes Relative: 14.6 %
Neutro Abs: 2428 cells/uL (ref 1500–7800)
Neutrophils Relative %: 46.7 %
Platelets: 349 10*3/uL (ref 140–400)
RBC: 4.51 10*6/uL (ref 4.20–5.80)
RDW: 11.3 % (ref 11.0–15.0)
Total Lymphocyte: 36.9 %
WBC: 5.2 10*3/uL (ref 3.8–10.8)

## 2020-04-02 LAB — COMPLETE METABOLIC PANEL WITH GFR
AG Ratio: 1.3 (calc) (ref 1.0–2.5)
ALT: 36 U/L (ref 9–46)
AST: 43 U/L — ABNORMAL HIGH (ref 10–40)
Albumin: 4.4 g/dL (ref 3.6–5.1)
Alkaline phosphatase (APISO): 79 U/L (ref 36–130)
BUN: 8 mg/dL (ref 7–25)
CO2: 25 mmol/L (ref 20–32)
Calcium: 9.7 mg/dL (ref 8.6–10.3)
Chloride: 105 mmol/L (ref 98–110)
Creat: 1.08 mg/dL (ref 0.60–1.35)
GFR, Est African American: 100 mL/min/{1.73_m2} (ref >=60)
GFR, Est Non African American: 86 mL/min/{1.73_m2} (ref >=60)
Globulin: 3.5 g/dL (calc) (ref 1.9–3.7)
Glucose, Bld: 89 mg/dL (ref 65–99)
Potassium: 3.9 mmol/L (ref 3.5–5.3)
Sodium: 141 mmol/L (ref 135–146)
Total Bilirubin: 0.4 mg/dL (ref 0.2–1.2)
Total Protein: 7.9 g/dL (ref 6.1–8.1)

## 2020-04-02 LAB — LIPID PANEL
Cholesterol: 118 mg/dL (ref <200)
HDL: 43 mg/dL (ref >=40)
LDL Cholesterol (Calc): 49 mg/dL (calc)
Non-HDL Cholesterol (Calc): 75 mg/dL (calc) (ref <130)
Total CHOL/HDL Ratio: 2.7 (calc) (ref <5.0)
Triglycerides: 193 mg/dL — ABNORMAL HIGH (ref <150)

## 2020-04-02 LAB — HIV-1 RNA QUANT-NO REFLEX-BLD
HIV 1 RNA Quant: 36 Copies/mL — ABNORMAL HIGH
HIV-1 RNA Quant, Log: 1.56 Log cps/mL — ABNORMAL HIGH

## 2020-04-02 LAB — RPR TITER: RPR Titer: 1:2 {titer} — ABNORMAL HIGH

## 2020-04-02 LAB — FLUORESCENT TREPONEMAL AB(FTA)-IGG-BLD: Fluorescent Treponemal ABS: REACTIVE — AB

## 2020-04-02 LAB — RPR: RPR Ser Ql: REACTIVE — AB

## 2020-04-02 LAB — TESTOSTERONE: Testosterone: 523 ng/dL (ref 250–827)

## 2020-04-02 MED ORDER — DOXYCYCLINE HYCLATE 100 MG PO TABS: 100.0000 mg | ORAL_TABLET | Freq: Two times a day (BID) | ORAL | 0 refills | Status: AC

## 2020-04-02 NOTE — Telephone Encounter (Signed)
Spoke with patient regarding results and recommendation per Dr. Tommy Medal. Patient verbalized understanding.  Patient also advised not to have any sexual encounters for 10 days post treatment. Rx sent to [facility] per patient request.  Eugenia Mcalpine

## 2020-04-02 NOTE — Addendum Note (Signed)
Addended by: Eugenia Mcalpine on: 04/02/2020 03:56 PM   Modules accepted: Orders

## 2020-04-28 ENCOUNTER — Telehealth: Payer: Self-pay | Admitting: *Deleted

## 2020-04-28 NOTE — Telephone Encounter (Signed)
Received a call from patient's friend asking for advice. Per friend, patient has an eye infection, wants to know, in general, where she could go for evaluation, specifically if she needs the ER.  RN did not disclose that she is a patient here. RN said that, in general, urgent care offices are able to assess eye infections. Landis Gandy, RN

## 2020-04-29 ENCOUNTER — Telehealth: Payer: Self-pay

## 2020-04-29 NOTE — Telephone Encounter (Signed)
Patient called requesting doxycyline be sent to her home address. RN gave patient [facility] phone number and advised her to call them to request home delivery. Patient verbalized understanding and has no further questions.   Beryle Flock, RN

## 2020-05-04 ENCOUNTER — Telehealth: Payer: Self-pay

## 2020-05-04 NOTE — Telephone Encounter (Signed)
Patient called saying she needed her Chlamydia medicine as well as Tivicay and Symtuza. Patient states she called [facility], but never talked to anyone. RN advised her to call again and arrange picking up those medications. Patient also complains of worsening vision. RN advised patient to seek evaluation in either an urgent care or ED. Patient verbalized understanding and has no further questions.   Beryle Flock, RN

## 2020-05-10 ENCOUNTER — Encounter (HOSPITAL_COMMUNITY): Payer: Self-pay | Admitting: Emergency Medicine

## 2020-05-10 ENCOUNTER — Other Ambulatory Visit: Payer: Self-pay

## 2020-05-10 ENCOUNTER — Ambulatory Visit (HOSPITAL_COMMUNITY)
Admission: EM | Admit: 2020-05-10 | Discharge: 2020-05-10 | Disposition: A | Discharge status: Home / Self Care | Payer: Medicaid Other

## 2020-05-10 DIAGNOSIS — H538 Other visual disturbances: Secondary | ICD-10-CM | POA: Diagnosis not present

## 2020-05-10 MED ORDER — TETRACAINE HCL 0.5 % OP SOLN
OPHTHALMIC | Status: AC
  Filled 2020-05-10: qty 4
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
## 2020-05-10 NOTE — Discharge Instructions (Addendum)
I believe this is either a cataract or glaucoma.   Please call the opthamologist on Monday.

## 2020-05-10 NOTE — ED Triage Notes (Signed)
Pt c/o left eye irritation. Pt states something blue is there in the eye but cannot remove it. Pt states they have been using otc eye drops with no relief.
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
## 2020-05-13 ENCOUNTER — Encounter (INDEPENDENT_AMBULATORY_CARE_PROVIDER_SITE_OTHER): Payer: Self-pay | Admitting: Ophthalmology

## 2020-05-13 ENCOUNTER — Ambulatory Visit (INDEPENDENT_AMBULATORY_CARE_PROVIDER_SITE_OTHER): Payer: Medicaid Other | Admitting: Ophthalmology

## 2020-05-13 ENCOUNTER — Other Ambulatory Visit: Payer: Self-pay

## 2020-05-13 DIAGNOSIS — H3581 Retinal edema: Secondary | ICD-10-CM

## 2020-05-13 DIAGNOSIS — H268 Other specified cataract: Secondary | ICD-10-CM
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
## 2020-06-03 ENCOUNTER — Other Ambulatory Visit: Payer: Self-pay

## 2020-06-03 ENCOUNTER — Encounter (INDEPENDENT_AMBULATORY_CARE_PROVIDER_SITE_OTHER): Payer: Self-pay

## 2020-06-03 ENCOUNTER — Encounter (INDEPENDENT_AMBULATORY_CARE_PROVIDER_SITE_OTHER): Payer: Medicaid Other | Admitting: Ophthalmology

## 2020-06-03 DIAGNOSIS — H268 Other specified cataract: Secondary | ICD-10-CM

## 2020-06-03 DIAGNOSIS — H3581 Retinal edema: Secondary | ICD-10-CM

## 2020-06-08 ENCOUNTER — Encounter (INDEPENDENT_AMBULATORY_CARE_PROVIDER_SITE_OTHER): Payer: Medicaid Other | Admitting: Ophthalmology

## 2020-06-29 ENCOUNTER — Other Ambulatory Visit: Payer: Self-pay

## 2020-06-29 ENCOUNTER — Ambulatory Visit (INDEPENDENT_AMBULATORY_CARE_PROVIDER_SITE_OTHER): Payer: Medicaid Other | Admitting: Infectious Disease

## 2020-06-29 ENCOUNTER — Other Ambulatory Visit (HOSPITAL_COMMUNITY): Payer: Self-pay | Admitting: Ophthalmology

## 2020-06-29 ENCOUNTER — Encounter: Payer: Self-pay | Admitting: Infectious Disease

## 2020-06-29 VITALS — BP 154/102 | HR 94 | Temp 98.0°F | Ht 71.0 in | Wt 153.0 lb

## 2020-06-29 DIAGNOSIS — B20 Human immunodeficiency virus [HIV] disease: Secondary | ICD-10-CM | POA: Diagnosis not present

## 2020-06-29 DIAGNOSIS — I10 Essential (primary) hypertension: Secondary | ICD-10-CM

## 2020-06-29 DIAGNOSIS — Z789 Other specified health status: Secondary | ICD-10-CM | POA: Diagnosis not present
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
## 2020-08-13 ENCOUNTER — Other Ambulatory Visit (HOSPITAL_COMMUNITY): Payer: Self-pay

## 2020-08-21 ENCOUNTER — Other Ambulatory Visit (HOSPITAL_COMMUNITY): Payer: Self-pay

## 2020-08-21 DIAGNOSIS — H25812 Combined forms of age-related cataract, left eye: Secondary | ICD-10-CM | POA: Diagnosis not present

## 2020-08-21 HISTORY — PX: CATARACT EXTRACTION: SUR2

## 2020-08-21 HISTORY — PX: EYE SURGERY: SHX253

## 2020-08-28 ENCOUNTER — Encounter (INDEPENDENT_AMBULATORY_CARE_PROVIDER_SITE_OTHER): Payer: Self-pay | Admitting: Ophthalmology

## 2020-08-31 ENCOUNTER — Ambulatory Visit (INDEPENDENT_AMBULATORY_CARE_PROVIDER_SITE_OTHER): Payer: Medicaid Other | Admitting: Ophthalmology

## 2020-08-31 ENCOUNTER — Other Ambulatory Visit: Payer: Self-pay

## 2020-08-31 ENCOUNTER — Encounter (INDEPENDENT_AMBULATORY_CARE_PROVIDER_SITE_OTHER): Payer: Self-pay | Admitting: Ophthalmology

## 2020-08-31 DIAGNOSIS — I10 Essential (primary) hypertension: Secondary | ICD-10-CM | POA: Diagnosis not present

## 2020-08-31 DIAGNOSIS — B20 Human immunodeficiency virus [HIV] disease: Secondary | ICD-10-CM

## 2020-08-31 DIAGNOSIS — H3581 Retinal edema: Secondary | ICD-10-CM

## 2020-08-31 DIAGNOSIS — Z961 Presence of intraocular lens: Secondary | ICD-10-CM

## 2020-08-31 DIAGNOSIS — H3322 Serous retinal detachment, left eye: Secondary | ICD-10-CM

## 2020-08-31 DIAGNOSIS — H35033 Hypertensive retinopathy, bilateral: Secondary | ICD-10-CM
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
## 2020-09-01 ENCOUNTER — Telehealth: Payer: Self-pay

## 2020-09-01 NOTE — Telephone Encounter (Signed)
Received fax from [facility] for surgical clearance. MD was able to review forms and sign approving medical clearance.  Forms faxed to [phone]. Confirmation received that fax was successful.

## 2020-09-02 ENCOUNTER — Other Ambulatory Visit (HOSPITAL_COMMUNITY): Payer: Self-pay

## 2020-09-08 ENCOUNTER — Other Ambulatory Visit (HOSPITAL_COMMUNITY): Payer: Self-pay
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
## 2020-09-14 ENCOUNTER — Ambulatory Visit (INDEPENDENT_AMBULATORY_CARE_PROVIDER_SITE_OTHER): Payer: Medicaid Other | Admitting: Ophthalmology

## 2020-09-14 ENCOUNTER — Other Ambulatory Visit: Payer: Self-pay

## 2020-09-14 ENCOUNTER — Other Ambulatory Visit (HOSPITAL_COMMUNITY)
Admission: RE | Admit: 2020-09-14 | Discharge: 2020-09-14 | Disposition: A | Discharge status: Home / Self Care | Payer: Medicaid Other | Source: Ambulatory Visit | Attending: Ophthalmology | Admitting: Ophthalmology

## 2020-09-14 ENCOUNTER — Encounter (INDEPENDENT_AMBULATORY_CARE_PROVIDER_SITE_OTHER): Payer: Self-pay | Admitting: Ophthalmology

## 2020-09-14 ENCOUNTER — Other Ambulatory Visit (HOSPITAL_COMMUNITY): Payer: Self-pay

## 2020-09-14 DIAGNOSIS — Z20822 Contact with and (suspected) exposure to covid-19: Secondary | ICD-10-CM | POA: Insufficient documentation

## 2020-09-14 DIAGNOSIS — Z961 Presence of intraocular lens: Secondary | ICD-10-CM

## 2020-09-14 DIAGNOSIS — Z01812 Encounter for preprocedural laboratory examination: Secondary | ICD-10-CM | POA: Diagnosis not present

## 2020-09-14 DIAGNOSIS — H35033 Hypertensive retinopathy, bilateral: Secondary | ICD-10-CM

## 2020-09-14 DIAGNOSIS — B20 Human immunodeficiency virus [HIV] disease: Secondary | ICD-10-CM

## 2020-09-14 DIAGNOSIS — H3322 Serous retinal detachment, left eye: Secondary | ICD-10-CM

## 2020-09-14 DIAGNOSIS — I10 Essential (primary) hypertension: Secondary | ICD-10-CM | POA: Diagnosis not present

## 2020-09-14 DIAGNOSIS — H268 Other specified cataract: Secondary | ICD-10-CM

## 2020-09-14 DIAGNOSIS — H3581 Retinal edema: Secondary | ICD-10-CM | POA: Diagnosis not present

## 2020-09-14 LAB — SARS CORONAVIRUS 2 (TAT 6-24 HRS): SARS Coronavirus 2: NEGATIVE

## 2020-09-14 MED FILL — Darunavir-Cobic-Emtricitab-Tenofov AF Tab 800-150-200-10 MG: ORAL | 30 days supply | Qty: 30 | Fill #0 | Status: AC

## 2020-09-14 MED FILL — Dolutegravir Sodium Tab 50 MG (Base Equiv): ORAL | 30 days supply | Qty: 60 | Fill #0 | Status: AC
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
## 2020-09-17 ENCOUNTER — Encounter (HOSPITAL_COMMUNITY): Payer: Self-pay | Admitting: Ophthalmology

## 2020-09-17 ENCOUNTER — Ambulatory Visit (HOSPITAL_COMMUNITY): Payer: Medicaid Other | Admitting: Certified Registered"

## 2020-09-17 ENCOUNTER — Ambulatory Visit (HOSPITAL_COMMUNITY)
Admission: RE | Admit: 2020-09-17 | Discharge: 2020-09-17 | Disposition: A | Discharge status: Home / Self Care | Payer: Medicaid Other | Attending: Ophthalmology | Admitting: Ophthalmology

## 2020-09-17 ENCOUNTER — Other Ambulatory Visit: Payer: Self-pay

## 2020-09-17 ENCOUNTER — Encounter (HOSPITAL_COMMUNITY)
Admission: RE | Disposition: A | Discharge status: Home / Self Care | Payer: Self-pay | Source: Home / Self Care | Attending: Ophthalmology

## 2020-09-17 DIAGNOSIS — F17210 Nicotine dependence, cigarettes, uncomplicated: Secondary | ICD-10-CM | POA: Diagnosis not present

## 2020-09-17 DIAGNOSIS — Z8249 Family history of ischemic heart disease and other diseases of the circulatory system: Secondary | ICD-10-CM | POA: Insufficient documentation

## 2020-09-17 DIAGNOSIS — H33002 Unspecified retinal detachment with retinal break, left eye: Secondary | ICD-10-CM | POA: Diagnosis not present

## 2020-09-17 DIAGNOSIS — I10 Essential (primary) hypertension: Secondary | ICD-10-CM | POA: Diagnosis not present

## 2020-09-17 DIAGNOSIS — H3322 Serous retinal detachment, left eye: Secondary | ICD-10-CM | POA: Diagnosis not present

## 2020-09-17 DIAGNOSIS — Z21 Asymptomatic human immunodeficiency virus [HIV] infection status: Secondary | ICD-10-CM | POA: Diagnosis not present

## 2020-09-17 DIAGNOSIS — H33022 Retinal detachment with multiple breaks, left eye: Secondary | ICD-10-CM | POA: Diagnosis present

## 2020-09-17 DIAGNOSIS — Z86718 Personal history of other venous thrombosis and embolism: Secondary | ICD-10-CM | POA: Diagnosis not present

## 2020-09-17 DIAGNOSIS — E876 Hypokalemia: Secondary | ICD-10-CM | POA: Diagnosis not present

## 2020-09-17 HISTORY — PX: SCLERAL BUCKLE: SHX5340

## 2020-09-17 HISTORY — PX: PHOTOCOAGULATION WITH LASER: SHX6027

## 2020-09-17 HISTORY — PX: GAS/FLUID EXCHANGE: SHX5334

## 2020-09-17 HISTORY — DX: Essential (primary) hypertension: I10

## 2020-09-17 HISTORY — DX: Anxiety disorder, unspecified: F41.9

## 2020-09-17 HISTORY — PX: EYE SURGERY: SHX253

## 2020-09-17 HISTORY — PX: PARS PLANA VITRECTOMY: SHX2166

## 2020-09-17 HISTORY — PX: PERFLUORONE INJECTION: SHX5302

## 2020-09-17 HISTORY — PX: RETINAL DETACHMENT SURGERY: SHX105

## 2020-09-17 HISTORY — PX: GAS INSERTION: SHX5336

## 2020-09-17 LAB — BASIC METABOLIC PANEL
Anion gap: 6 (ref 5–15)
BUN: 7 mg/dL (ref 6–20)
CO2: 26 mmol/L (ref 22–32)
Calcium: 9 mg/dL (ref 8.9–10.3)
Chloride: 108 mmol/L (ref 98–111)
Creatinine, Ser: 1.04 mg/dL (ref 0.61–1.24)
GFR, Estimated: >60 mL/min (ref >60)
Glucose, Bld: 91 mg/dL (ref 70–99)
Potassium: 3.2 mmol/L — ABNORMAL LOW (ref 3.5–5.1)
Sodium: 140 mmol/L (ref 135–145)

## 2020-09-17 SURGERY — SCLERAL BUCKLE
Anesthesia: General | Site: Eye | Laterality: Left

## 2020-09-17 MED ORDER — BUPIVACAINE HCL (PF) 0.75 % IJ SOLN
INTRAMUSCULAR | Status: DC | PRN
  Administered 2020-09-17: 5 mL

## 2020-09-17 MED ORDER — ARMC OPHTHALMIC DILATING DROPS
OPHTHALMIC | Status: DC | PRN
  Administered 2020-09-17: 1 via OPHTHALMIC

## 2020-09-17 MED ORDER — EPINEPHRINE PF 1 MG/ML IJ SOLN
INTRAOCULAR | Status: DC | PRN
  Administered 2020-09-17: 5000 mL

## 2020-09-17 MED ORDER — TRIAMCINOLONE ACETONIDE 40 MG/ML IJ SUSP
INTRAMUSCULAR | Status: DC | PRN
  Administered 2020-09-17: 40 mg

## 2020-09-17 MED ORDER — EPINEPHRINE PF 1 MG/ML IJ SOLN
INTRAMUSCULAR | Status: AC
  Filled 2020-09-17: qty 1

## 2020-09-17 MED ORDER — LIDOCAINE HCL (PF) 1 % IJ SOLN
INTRAMUSCULAR | Status: DC | PRN
  Administered 2020-09-17: 5 mL

## 2020-09-17 MED ORDER — HYDROCODONE-ACETAMINOPHEN 5-325 MG PO TABS: 1.0000 | ORAL_TABLET | ORAL | 0 refills | Status: DC | PRN

## 2020-09-17 MED ORDER — LIDOCAINE HCL (PF) 1 % IJ SOLN
INTRAMUSCULAR | Status: AC
  Filled 2020-09-17: qty 5

## 2020-09-17 MED ORDER — STERILE WATER FOR INJECTION IJ SOLN
INTRAMUSCULAR | Status: AC
  Filled 2020-09-17: qty 10

## 2020-09-17 MED ORDER — DORZOLAMIDE HCL-TIMOLOL MAL 2-0.5 % OP SOLN
OPHTHALMIC | Status: DC | PRN
  Administered 2020-09-17: 1 [drp] via OPHTHALMIC

## 2020-09-17 MED ORDER — ATROPINE SULFATE 1 % OP SOLN
OPHTHALMIC | Status: DC | PRN
  Administered 2020-09-17: 1 [drp] via OPHTHALMIC

## 2020-09-17 MED ORDER — ONDANSETRON HCL 4 MG/2ML IJ SOLN
INTRAMUSCULAR | Status: AC
  Filled 2020-09-17: qty 2

## 2020-09-17 MED ORDER — NA CHONDROIT SULF-NA HYALURON 40-30 MG/ML IO SOSY
INTRAOCULAR | Status: AC
  Filled 2020-09-17: qty 0.5

## 2020-09-17 MED ORDER — BSS PLUS IO SOLN
INTRAOCULAR | Status: AC
  Filled 2020-09-17: qty 500

## 2020-09-17 MED ORDER — ONDANSETRON HCL 4 MG/2ML IJ SOLN: 4.0000 mg | Freq: Four times a day (QID) | INTRAMUSCULAR | Status: DC | PRN

## 2020-09-17 MED ORDER — FENTANYL CITRATE (PF) 100 MCG/2ML IJ SOLN
INTRAMUSCULAR | Status: DC | PRN
  Administered 2020-09-17 (×2): 50 ug via INTRAVENOUS
  Administered 2020-09-17: 25 ug via INTRAVENOUS
  Administered 2020-09-17: 50 ug via INTRAVENOUS

## 2020-09-17 MED ORDER — ROCURONIUM BROMIDE 10 MG/ML (PF) SYRINGE
PREFILLED_SYRINGE | INTRAVENOUS | Status: DC | PRN
  Administered 2020-09-17 (×3): 20 mg via INTRAVENOUS
  Administered 2020-09-17: 60 mg via INTRAVENOUS
  Administered 2020-09-17: 20 mg via INTRAVENOUS

## 2020-09-17 MED ORDER — ATROPINE SULFATE 1 % OP SOLN
1.0000 [drp] | OPHTHALMIC | Status: AC | PRN
  Administered 2020-09-17 (×3): 1 [drp] via OPHTHALMIC
  Filled 2020-09-17: qty 5

## 2020-09-17 MED ORDER — MIDAZOLAM HCL 2 MG/2ML IJ SOLN
INTRAMUSCULAR | Status: DC | PRN
  Administered 2020-09-17: 2 mg via INTRAVENOUS

## 2020-09-17 MED ORDER — BRIMONIDINE TARTRATE 0.2 % OP SOLN
OPHTHALMIC | Status: AC
  Filled 2020-09-17: qty 5

## 2020-09-17 MED ORDER — BUPIVACAINE HCL (PF) 0.75 % IJ SOLN
INTRAMUSCULAR | Status: AC
  Filled 2020-09-17: qty 10

## 2020-09-17 MED ORDER — TRIAMCINOLONE ACETONIDE 40 MG/ML IJ SUSP
INTRAMUSCULAR | Status: AC
  Filled 2020-09-17: qty 5

## 2020-09-17 MED ORDER — OXYCODONE HCL 5 MG PO TABS
ORAL_TABLET | ORAL | Status: AC
  Administered 2020-09-17: 5 mg via ORAL
  Filled 2020-09-17: qty 1

## 2020-09-17 MED ORDER — ACETAZOLAMIDE SODIUM 500 MG IJ SOLR
INTRAMUSCULAR | Status: AC
  Filled 2020-09-17: qty 500

## 2020-09-17 MED ORDER — TROPICAMIDE 1 % OP SOLN
1.0000 [drp] | OPHTHALMIC | Status: DC | PRN
  Filled 2020-09-17: qty 15

## 2020-09-17 MED ORDER — POLYMYXIN B SULFATE 500000 UNITS IJ SOLR
INTRAMUSCULAR | Status: AC
  Filled 2020-09-17: qty 500000

## 2020-09-17 MED ORDER — NA CHONDROIT SULF-NA HYALURON 40-30 MG/ML IO SOSY
INTRAOCULAR | Status: AC
  Filled 2020-09-17: qty 1

## 2020-09-17 MED ORDER — PROPARACAINE HCL 0.5 % OP SOLN
1.0000 [drp] | OPHTHALMIC | Status: AC | PRN
  Administered 2020-09-17 (×3): 1 [drp] via OPHTHALMIC
  Filled 2020-09-17: qty 15

## 2020-09-17 MED ORDER — PREDNISOLONE ACETATE 1 % OP SUSP
OPHTHALMIC | Status: DC | PRN
  Administered 2020-09-17: 1 [drp] via OPHTHALMIC

## 2020-09-17 MED ORDER — DEXAMETHASONE SODIUM PHOSPHATE 10 MG/ML IJ SOLN
INTRAMUSCULAR | Status: AC
  Filled 2020-09-17: qty 1

## 2020-09-17 MED ORDER — STERILE WATER FOR IRRIGATION IR SOLN
Status: DC | PRN
  Administered 2020-09-17: 1000 mL

## 2020-09-17 MED ORDER — STERILE WATER FOR INJECTION IJ SOLN: INTRAMUSCULAR | Status: DC | PRN

## 2020-09-17 MED ORDER — PHENYLEPHRINE HCL 2.5 % OP SOLN
1.0000 [drp] | OPHTHALMIC | Status: AC | PRN
  Administered 2020-09-17 (×3): 1 [drp] via OPHTHALMIC
  Filled 2020-09-17: qty 2

## 2020-09-17 MED ORDER — LIDOCAINE HCL (PF) 2 % IJ SOLN
INTRAMUSCULAR | Status: AC
  Filled 2020-09-17: qty 10

## 2020-09-17 MED ORDER — PROPOFOL 10 MG/ML IV BOLUS
INTRAVENOUS | Status: DC | PRN
  Administered 2020-09-17: 200 mg via INTRAVENOUS

## 2020-09-17 MED ORDER — PREDNISOLONE ACETATE 1 % OP SUSP
OPHTHALMIC | Status: AC
  Filled 2020-09-17: qty 5

## 2020-09-17 MED ORDER — CARBACHOL 0.01 % IO SOLN
INTRAOCULAR | Status: AC
  Filled 2020-09-17: qty 1.5

## 2020-09-17 MED ORDER — NA CHONDROIT SULF-NA HYALURON 40-30 MG/ML IO SOSY
INTRAOCULAR | Status: DC | PRN
  Administered 2020-09-17 (×2): 0.5 mL via INTRAOCULAR

## 2020-09-17 MED ORDER — OXYCODONE HCL 5 MG/5ML PO SOLN: 5.0000 mg | Freq: Once | ORAL | Status: AC | PRN

## 2020-09-17 MED ORDER — BRIMONIDINE TARTRATE 0.2 % OP SOLN
OPHTHALMIC | Status: DC | PRN
  Administered 2020-09-17: 1 [drp] via OPHTHALMIC

## 2020-09-17 MED ORDER — LIDOCAINE 2% (20 MG/ML) 5 ML SYRINGE
INTRAMUSCULAR | Status: AC
  Filled 2020-09-17: qty 5

## 2020-09-17 MED ORDER — OXYCODONE HCL 5 MG PO TABS: 5.0000 mg | ORAL_TABLET | Freq: Once | ORAL | Status: AC | PRN

## 2020-09-17 MED ORDER — GATIFLOXACIN 0.5 % OP SOLN
OPHTHALMIC | Status: DC | PRN
  Administered 2020-09-17: 1 [drp] via OPHTHALMIC

## 2020-09-17 MED ORDER — BACITRACIN-POLYMYXIN B 500-10000 UNIT/GM OP OINT
TOPICAL_OINTMENT | OPHTHALMIC | Status: AC
  Filled 2020-09-17: qty 3.5

## 2020-09-17 MED ORDER — GATIFLOXACIN 0.5 % OP SOLN
OPHTHALMIC | Status: AC
  Filled 2020-09-17: qty 2.5

## 2020-09-17 MED ORDER — BSS IO SOLN
INTRAOCULAR | Status: DC | PRN
  Administered 2020-09-17: 15 mL via INTRAOCULAR

## 2020-09-17 MED ORDER — DEXAMETHASONE SODIUM PHOSPHATE 10 MG/ML IJ SOLN
INTRAMUSCULAR | Status: DC | PRN
  Administered 2020-09-17: 5 mg via INTRAVENOUS

## 2020-09-17 MED ORDER — BACITRACIN-POLYMYXIN B 500-10000 UNIT/GM OP OINT
TOPICAL_OINTMENT | OPHTHALMIC | Status: DC | PRN
  Administered 2020-09-17: 1 via OPHTHALMIC

## 2020-09-17 MED ORDER — ONDANSETRON HCL 4 MG/2ML IJ SOLN
INTRAMUSCULAR | Status: DC | PRN
  Administered 2020-09-17: 4 mg via INTRAVENOUS

## 2020-09-17 MED ORDER — MIDAZOLAM HCL 2 MG/2ML IJ SOLN
INTRAMUSCULAR | Status: AC
  Filled 2020-09-17: qty 2

## 2020-09-17 MED ORDER — SODIUM CHLORIDE (PF) 0.9 % IJ SOLN
INTRAMUSCULAR | Status: AC
  Filled 2020-09-17: qty 10

## 2020-09-17 MED ORDER — CHLORHEXIDINE GLUCONATE 0.12 % MT SOLN
OROMUCOSAL | Status: AC
  Filled 2020-09-17: qty 15

## 2020-09-17 MED ORDER — ATROPINE SULFATE 1 % OP SOLN
OPHTHALMIC | Status: AC
  Filled 2020-09-17: qty 5

## 2020-09-17 MED ORDER — TOBRAMYCIN-DEXAMETHASONE 0.3-0.1 % OP OINT
TOPICAL_OINTMENT | OPHTHALMIC | Status: AC
  Filled 2020-09-17: qty 3.5

## 2020-09-17 MED ORDER — FENTANYL CITRATE (PF) 250 MCG/5ML IJ SOLN
INTRAMUSCULAR | Status: AC
  Filled 2020-09-17: qty 5

## 2020-09-17 MED ORDER — LIDOCAINE 2% (20 MG/ML) 5 ML SYRINGE
INTRAMUSCULAR | Status: DC | PRN
  Administered 2020-09-17: 60 mg via INTRAVENOUS

## 2020-09-17 MED ORDER — ORAL CARE MOUTH RINSE
15.0000 mL | Freq: Once | OROMUCOSAL | Status: AC
Start: 2020-09-17 — End: 2020-09-17

## 2020-09-17 MED ORDER — LACTATED RINGERS IV SOLN: INTRAVENOUS | Status: DC

## 2020-09-17 MED ORDER — SUGAMMADEX SODIUM 200 MG/2ML IV SOLN
INTRAVENOUS | Status: DC | PRN
  Administered 2020-09-17: 200 mg via INTRAVENOUS

## 2020-09-17 MED ORDER — STERILE WATER FOR INJECTION IJ SOLN
INTRAMUSCULAR | Status: DC | PRN
  Administered 2020-09-17: 20 mL

## 2020-09-17 MED ORDER — BSS IO SOLN
INTRAOCULAR | Status: AC
  Filled 2020-09-17: qty 15

## 2020-09-17 MED ORDER — CEFTAZIDIME 1 G IJ SOLR
INTRAMUSCULAR | Status: AC
  Filled 2020-09-17: qty 1

## 2020-09-17 MED ORDER — ROCURONIUM BROMIDE 10 MG/ML (PF) SYRINGE
PREFILLED_SYRINGE | INTRAVENOUS | Status: AC
  Filled 2020-09-17: qty 10

## 2020-09-17 MED ORDER — FENTANYL CITRATE (PF) 100 MCG/2ML IJ SOLN: 25.0000 ug | INTRAMUSCULAR | Status: DC | PRN

## 2020-09-17 MED ORDER — CHLORHEXIDINE GLUCONATE 0.12 % MT SOLN
15.0000 mL | Freq: Once | OROMUCOSAL | Status: AC
  Administered 2020-09-17: 15 mL via OROMUCOSAL

## 2020-09-17 MED ORDER — DORZOLAMIDE HCL-TIMOLOL MAL 2-0.5 % OP SOLN
OPHTHALMIC | Status: AC
  Filled 2020-09-17: qty 10

## 2020-09-17 MED ORDER — PHENYLEPHRINE HCL 10 % OP SOLN
1.0000 [drp] | OPHTHALMIC | Status: AC | PRN
  Administered 2020-09-17 (×3): 1 [drp] via OPHTHALMIC
  Filled 2020-09-17: qty 5

## 2020-09-17 SURGICAL SUPPLY — 54 items
APL SWBSTK 6 STRL LF DISP (MISCELLANEOUS) ×8
APPLICATOR COTTON TIP 6 STRL (MISCELLANEOUS) ×8 IMPLANT
APPLICATOR COTTON TIP 6IN STRL (MISCELLANEOUS) ×12
BAND SCLERAL BUCKLING TYPE 42 (Ophthalmic Related) ×2 IMPLANT
BAND SCLR 100X4X1.25 TY 42 (Ophthalmic Related) ×2 IMPLANT
BAND WRIST GAS GREEN (MISCELLANEOUS) IMPLANT
BETADINE 5% OPHTHALMIC (OPHTHALMIC) ×4 IMPLANT
BNDG EYE OVAL (GAUZE/BANDAGES/DRESSINGS) ×4 IMPLANT
CABLE BIPOLOR RESECTION CORD (MISCELLANEOUS) IMPLANT
CANNULA DUALBORE 25G (CANNULA) ×2 IMPLANT
CANNULA FLEX TIP 25G (CANNULA) ×3 IMPLANT
COVER SURGICAL LIGHT HANDLE (MISCELLANEOUS) ×3 IMPLANT
COVER WAND RF STERILE (DRAPES) ×3 IMPLANT
DRAPE MICROSCOPE LEICA 46X105 (MISCELLANEOUS) ×3 IMPLANT
DRAPE OPHTHALMIC 77X100 STRL (CUSTOM PROCEDURE TRAY) ×2 IMPLANT
FILTER BLUE MILLIPORE (MISCELLANEOUS) ×2 IMPLANT
FILTER STRAW FLUID ASPIR (MISCELLANEOUS) IMPLANT
GAS AUTO FILL CONSTEL (OPHTHALMIC) ×3
GAS AUTO FILL CONSTELLATION (OPHTHALMIC) ×1 IMPLANT
GAS IO PFP 125GM ISPAN CNSTL (MISCELLANEOUS) ×1 IMPLANT
GAS TANK CONSTELL (MISCELLANEOUS) ×3
GAS WRIST BAND GREEN (MISCELLANEOUS)
GLOVE BIO SURGEON STRL SZ7.5 (GLOVE) ×6 IMPLANT
GLOVE BIOGEL M 7.0 STRL (GLOVE) ×3 IMPLANT
GOWN STRL REUS W/ TWL LRG LVL3 (GOWN DISPOSABLE) ×4 IMPLANT
GOWN STRL REUS W/ TWL XL LVL3 (GOWN DISPOSABLE) ×2 IMPLANT
GOWN STRL REUS W/TWL LRG LVL3 (GOWN DISPOSABLE) ×6
GOWN STRL REUS W/TWL XL LVL3 (GOWN DISPOSABLE) ×3
KIT BASIN OR (CUSTOM PROCEDURE TRAY) ×3 IMPLANT
KIT PERFLUORON PROCEDURE 5ML (MISCELLANEOUS) ×2 IMPLANT
NDL 18GX1X1/2 (RX/OR ONLY) (NEEDLE) ×1 IMPLANT
NDL 25GX 5/8IN NON SAFETY (NEEDLE) ×4 IMPLANT
NEEDLE 18GX1X1/2 (RX/OR ONLY) (NEEDLE) ×3 IMPLANT
NEEDLE 25GX 5/8IN NON SAFETY (NEEDLE) ×3 IMPLANT
NS IRRIG 1000ML POUR BTL (IV SOLUTION) ×3 IMPLANT
OPHTHALMIC BETADINE 5% (OPHTHALMIC) ×6
PACK VITRECTOMY CUSTOM (CUSTOM PROCEDURE TRAY) ×3 IMPLANT
PAD ARMBOARD 7.5X6 YLW CONV (MISCELLANEOUS) ×4 IMPLANT
PAK PIK VITRECTOMY CVS 25GA (OPHTHALMIC) ×3 IMPLANT
PROBE LASER ILLUM FLEX CVD 25G (OPHTHALMIC) ×2 IMPLANT
SHIELD EYE LENSE ONLY DISP (GAUZE/BANDAGES/DRESSINGS) ×2 IMPLANT
STRIP CLOSURE SKIN 1/2X4 (GAUZE/BANDAGES/DRESSINGS) ×2 IMPLANT
SUT ETHILON 5.0 S-24 (SUTURE) ×3 IMPLANT
SUT SILK 2 0 (SUTURE) ×3
SUT SILK 2 0 TIES 17X18 (SUTURE) ×3
SUT SILK 2-0 18XBRD TIE 12 (SUTURE) ×2 IMPLANT
SUT SILK 2-0 18XBRD TIE BLK (SUTURE) ×2 IMPLANT
SUT VICRYL 7 0 TG140 8 (SUTURE) ×3 IMPLANT
SYR 10ML LL (SYRINGE) ×5 IMPLANT
SYR BULB EAR ULCER 3OZ GRN STR (SYRINGE) ×3 IMPLANT
SYR TB 1ML LUER SLIP (SYRINGE) ×3 IMPLANT
TOWEL GREEN STERILE FF (TOWEL DISPOSABLE) ×3 IMPLANT
TRAY FOLEY W/BAG SLVR 14FR (SET/KITS/TRAYS/PACK) ×3 IMPLANT
WATER STERILE IRR 1000ML POUR (IV SOLUTION) ×3 IMPLANT

## 2020-09-17 NOTE — Op Note (Signed)
Date of procedure: 04.21.22  Surgeon: Bernarda Caffey, MD, PhD  Assistant:Amanda Owens Shark, Ophthalmic Assistant   Pre-operative Diagnosis: MaculainvolvingRhegmatogenous Retinal Detachment, Left Eye  Post-operative diagnosis: MaculainvolvingRhegmatogenous Retinal Detachment, LeftEye  Anesthesia: GETA  Procedures: 1) Scleral Buckle, Left Eye 2) 25gauge pars plana vitrectomy,Left Eye CPT [phone] 3) Perfluorocarbon injection 4)Fluid-air exchange, LeftEye 5) Endolaser, LeftEye 5) Injection [id number]  Complications: none Estimated blood loss: minimal Specimens: none  Brief history: The patient has a history of decreased vision in the affected left eye, and on examination, was noted to have a macula-involving retinal detachment, affecting activities of daily living.The risks, benefits, and alternatives were explained to the patient, including pain, bleeding, infection, loss of vision, double vision, droopy eyelids, and need for more surgeries.Informed consent was obtained from the patient and placed in the chart.   Procedure: The patient was brought to the preoperative holding area where the correct eye wasconfirmed and marked.The patient was then brought to the operating room where general endotracheal anesthesia was induced. A secondary time-out was performed to identify the correct patient, eyes, procedures, and any allergies.The left eye was prepped and draped in the usual sterile ophthalmic fashion followed by placement of a lid speculum. A 360 conjunctival peritomy was created using Westcott scissors and 0.12 forceps. Each of the four quadrants between the rectus muscles was dissected using Stevens scissors to detach Tenon's attachments from the globe. Each of the four rectus muscles was isolated on a muscle hook and slung using 2-0 Silk suture in the usual standard fashion. Each of the four quadrants between  the rectus muscles was inspected and there were noted to be no areas of scleral thinning. A #63 silicone band was then brought onto the field and was threaded under each rectus muscle. The band was thenlooselysecured using a #70 Watzke sleeve in the superotemporalquadrant. The band was then sutured to the sclera in each quadrant using 5-0 nylon sutures passed partial thickness through the sclera in a horizontal mattress fashion. The scleral buckle was thentightened to the appropriate height with two locking needle drivers. Attention was then turned to the vitrectomy portion of the procedure. A 25gauge trocar was placed in the inferotemporal quadrant in a beveled fashion. A 4 mm infusion cannula was placed through this trocar, and the infusion cannula was confirmed in the vitreous cavity with no incarceration of retina or choroid prior to turning it on. Two additional 25gauge trocars were placed in the superonasal and superotemporal quadrants(~2 and 10 oclock, respectively)in a similar beveled fashion. At this time,a standard three-port pars plana vitrectomy was performed using the light pipe, the cutter, and the BIOM viewing system. A thoroughanterior,core and peripheral vitreous dissection was performed. A posterior vitreous detachment was confirmed over the optic disc.There was a subtotal inferior retinal detachment from 0130 to 1000involving the macula with the foveadetached. There was a large retinal break within the detached retina at 0200 and smaller breaks at 0300. Of note, there was a subretinal band tracking through the fovea and scattered patches of subretinal pigmentation indicative of chronicity. Traction was removed from all retinal breaks. The breaks weretrimmed using the cutter to smooth the edges.Perfluoron was injected to push the subretinal fluid anterior to the scleral buckle, flattening the retinal breaks. Under perfluoron, endolaser was applied around  the breaks and posterior to the buckle. A small drainage retinotomy was made in the inferior periphery overlying the buckle at 0600 using diathermy and the extrusion cannula.A complete fluid-air exchange was performed with a soft tip extrusion cannula over  the retinotomy, then the breaks, and then posteriorly to remove the perfluoron. After completion of these maneuvers, the retina was flat over the macula and over the scleral buckle. Under air, endolaser was applied over the scleral buckle anteriorly. At this time, the buckle height was confirmed and the buckle was finalized by trimming the band ends.The superotemporal trocar wasremoved and sutured with 7-0 vicryl in an interrupted fashion.A complete air to14%C3F8gas exchange was performed through the infusion cannula and vented through the superotemporal trocar using the extrusion cannula.Thesuperonasal trocar andinfusion cannula and associated trocar werethen removed and sutured with 7-0 vicryl in an interrupted fashion. Kefzol+ polymixinirrigation wasthenused over the buckle. A subtenon's block containing 0.75% marcaine and 2% lidocaine was administered. The conjunctiva was closed with 7-0 vicryl sutures. The eye's intraocular pressurewas confirmed to be at a physiologic level by digital palpation. Subconjunctival injections of Antibiotic and kenalogwere administered. The lid speculum and drapes were removed. Drops of an antibiotic, antihypertensives, and steroid were given. Copious antibiotic ointment was instilled into the eye. The eye was patched and shielded. The patient tolerated the procedure well without any intraoperative or immediate postoperative complications. The patient was taken to the recovery room in good condition. The patient was instructed to maintain a strict face-down position andwill be seen by Dr. Baltazar Najjar in clinic.

## 2020-09-17 NOTE — Discharge Instructions (Addendum)
POSTOPERATIVE INSTRUCTIONS  Your doctor has performed vitreoretinal surgery on you at [place].  - Keep eye patched and shielded until seen by Dr. Zamora 8 AM tomorrow in clinic - Do not use drops until return - FACE DOWN POSITIONING WHILE AWAKE - Sleep with belly down or on right side, avoid laying flat on back.    - No strenuous bending, stooping or lifting.  - You may not drive until further notice.  - If your doctor used a gas bubble in your eye during the procedure he will advise you on postoperative positioning. If you have a gas bubble you will be wearing a green bracelet that was applied in the operating room. The green bracelet should stay on as long as the gas bubble is in your eye. While the gas bubble is present you should not fly in an airplane. If you require general anesthesia while the gas bubble is present you must notify your anesthesiologist that an intraocular gas bubble is present so he can take the appropriate precautions.  - Tylenol or any other over-the-counter pain reliever can be used according to your doctor. If more pain medicine is required, your doctor will have a prescription for you.  - You may read, go up and down stairs, and watch television.     Brian Zamora, M.D., Ph.D.  

## 2020-09-17 NOTE — Transfer of Care (Signed)
Immediate Anesthesia Transfer of Care Note  Patient: Stephen Griffin  Procedure(s) Performed: SCLERAL BUCKLE (Left Eye) PARS PLANA VITRECTOMY WITH 25 GAUGE (Left Eye) PHOTOCOAGULATION WITH LASER (Left Eye) PERFLUORON INJECTION (Left Eye) INSERTION OF GAS C3F8 (Left Eye) GAS/FLUID EXCHANGE (Left Eye)  Patient Location: PACU  Anesthesia Type:General  Level of Consciousness: awake and oriented  Airway & Oxygen Therapy: Patient Spontanous Breathing  Post-op Assessment: Report given to RN  Post vital signs: Reviewed and stable  Last Vitals:  Vitals Value Taken Time  BP 130/86 09/17/20 1645  Temp    Pulse 104 09/17/20 1647  Resp 19 09/17/20 1647  SpO2 98 % 09/17/20 1647  Vitals shown include unvalidated device data.  Last Pain:  Vitals:   09/17/20 1025  TempSrc:   PainSc: 0-No pain         Complications: No complications documented.

## 2020-09-17 NOTE — Brief Op Note (Signed)
09/17/2020  4:45 PM  PATIENT:  Stephen Griffin  40 y.o. adult  PRE-OPERATIVE DIAGNOSIS:  left eye retinal detachment  POST-OPERATIVE DIAGNOSIS:  left eye retinal detachment  PROCEDURE:  Procedure(s): SCLERAL BUCKLE (Left) PARS PLANA VITRECTOMY WITH 25 GAUGE (Left) PHOTOCOAGULATION WITH LASER (Left) PERFLUORON INJECTION (Left) INSERTION OF GAS C3F8 (Left) GAS/FLUID EXCHANGE (Left)  SURGEON:  Surgeon(s) and Role:    Bernarda Caffey, MD - Primary  ASSISTANTS: Ernest Mallick, Ophthalmic Assistant    ANESTHESIA:   local and general  EBL:  minimal   BLOOD ADMINISTERED:none  DRAINS: none   LOCAL MEDICATIONS USED:  MARCAINE    and XYLOCAINE   SPECIMEN:  No Specimen  DISPOSITION OF SPECIMEN:  N/A  COUNTS:  YES  TOURNIQUET:  * No tourniquets in log *  DICTATION: .Note written in EPIC  PLAN OF CARE: Discharge to home after PACU  PATIENT DISPOSITION:  PACU - hemodynamically stable.   Delay start of Pharmacological VTE agent (>24hrs) due to surgical blood loss or risk of bleeding: not applicable

## 2020-09-17 NOTE — Anesthesia Procedure Notes (Signed)
Procedure Name: Intubation Date/Time: 09/17/2020 12:52 PM Performed by: Barrington Ellison, CRNA Pre-anesthesia Checklist: Patient identified, Emergency Drugs available, Suction available and Patient being monitored Patient Re-evaluated:Patient Re-evaluated prior to induction Oxygen Delivery Method: Circle System Utilized Preoxygenation: Pre-oxygenation with 100% oxygen Induction Type: IV induction Ventilation: Mask ventilation without difficulty Laryngoscope Size: Mac and 4 Grade View: Grade I Tube type: Oral Tube size: 7.5 mm Number of attempts: 1 Airway Equipment and Method: Stylet and Oral airway Placement Confirmation: ETT inserted through vocal cords under direct vision,  positive ETCO2 and breath sounds checked- equal and bilateral Secured at: 21 cm Tube secured with: Tape Dental Injury: Teeth and Oropharynx as per pre-operative assessment

## 2020-09-17 NOTE — Interval H&P Note (Signed)
History and Physical Interval Note:  09/17/2020 10:01 AM  Stephen Griffin  has presented today for surgery, with the diagnosis of left eye retinal detachment.  The various methods of treatment have been discussed with the patient and family. After consideration of risks, benefits and other options for treatment, the patient has consented to  Procedure(s): VITRECTOMY 25 GAUGE WITH SCLERAL BUCKLE (Left) as a surgical intervention.  The patient's history has been reviewed, patient examined, no change in status, stable for surgery.  I have reviewed the patient's chart and labs.  Questions were answered to the patient's satisfaction.     Bernarda Caffey

## 2020-09-17 NOTE — Anesthesia Postprocedure Evaluation (Signed)
Anesthesia Post Note  Patient: Stephen Griffin  Procedure(s) Performed: SCLERAL BUCKLE (Left Eye) PARS PLANA VITRECTOMY WITH 25 GAUGE (Left Eye) PHOTOCOAGULATION WITH LASER (Left Eye) PERFLUORON INJECTION (Left Eye) INSERTION OF GAS C3F8 (Left Eye) GAS/FLUID EXCHANGE (Left Eye)     Patient location during evaluation: PACU Anesthesia Type: General Level of consciousness: awake and alert Pain management: pain level controlled Vital Signs Assessment: post-procedure vital signs reviewed and stable Respiratory status: spontaneous breathing, nonlabored ventilation and respiratory function stable Cardiovascular status: blood pressure returned to baseline and stable Postop Assessment: no apparent nausea or vomiting Anesthetic complications: no   No complications documented.  Last Vitals:  Vitals:   09/17/20 1700 09/17/20 1715  BP: (!) 139/102 120/80  Pulse: 95 88  Resp: 18 (!) 24  Temp:    SpO2: 96% 98%    Last Pain:  Vitals:   09/17/20 1715  TempSrc:   PainSc: Asleep                 Chinenye Katzenberger,W. [place]

## 2020-09-17 NOTE — Anesthesia Preprocedure Evaluation (Signed)
Anesthesia Evaluation  Patient identified by MRN, date of birth, ID band Patient awake    Reviewed: Allergy & Precautions, H&P , NPO status , Patient's Chart, lab work & pertinent test results  Airway Mallampati: II   Neck ROM: full    Dental   Pulmonary asthma , Current Smoker,    breath sounds clear to auscultation       Cardiovascular hypertension,  Rhythm:regular Rate:Normal     Neuro/Psych  Headaches, PSYCHIATRIC DISORDERS Anxiety  Neuromuscular disease    GI/Hepatic   Endo/Other    Renal/GU      Musculoskeletal   Abdominal   Peds  Hematology  (+) HIV,   Anesthesia Other Findings   Reproductive/Obstetrics                             Anesthesia Physical Anesthesia Plan  ASA: II  Anesthesia Plan: General   Post-op Pain Management:    Induction: Intravenous  PONV Risk Score and Plan: 1 and Ondansetron, Dexamethasone, Midazolam and Treatment may vary due to age or medical condition  Airway Management Planned: Oral ETT  Additional Equipment:   Intra-op Plan:   Post-operative Plan: Extubation in OR  Informed Consent: I have reviewed the patients History and Physical, chart, labs and discussed the procedure including the risks, benefits and alternatives for the proposed anesthesia with the patient or authorized representative who has indicated his/her understanding and acceptance.     Dental advisory given  Plan Discussed with: CRNA, Anesthesiologist and Surgeon  Anesthesia Plan Comments:         Anesthesia Quick Evaluation

## 2020-09-18 ENCOUNTER — Ambulatory Visit (INDEPENDENT_AMBULATORY_CARE_PROVIDER_SITE_OTHER): Payer: Medicaid Other | Admitting: Ophthalmology

## 2020-09-18 ENCOUNTER — Encounter (INDEPENDENT_AMBULATORY_CARE_PROVIDER_SITE_OTHER): Payer: Self-pay | Admitting: Ophthalmology

## 2020-09-18 DIAGNOSIS — H35033 Hypertensive retinopathy, bilateral: Secondary | ICD-10-CM

## 2020-09-18 DIAGNOSIS — H3581 Retinal edema: Secondary | ICD-10-CM

## 2020-09-18 DIAGNOSIS — H3322 Serous retinal detachment, left eye: Secondary | ICD-10-CM

## 2020-09-18 DIAGNOSIS — B20 Human immunodeficiency virus [HIV] disease: Secondary | ICD-10-CM

## 2020-09-18 DIAGNOSIS — I10 Essential (primary) hypertension: Secondary | ICD-10-CM

## 2020-09-18 DIAGNOSIS — Z961 Presence of intraocular lens: Secondary | ICD-10-CM
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
## 2020-09-24 ENCOUNTER — Encounter (INDEPENDENT_AMBULATORY_CARE_PROVIDER_SITE_OTHER): Payer: Medicaid Other | Admitting: Ophthalmology

## 2020-09-24 DIAGNOSIS — H268 Other specified cataract: Secondary | ICD-10-CM

## 2020-09-24 DIAGNOSIS — I10 Essential (primary) hypertension: Secondary | ICD-10-CM

## 2020-09-24 DIAGNOSIS — H3322 Serous retinal detachment, left eye: Secondary | ICD-10-CM

## 2020-09-24 DIAGNOSIS — H35033 Hypertensive retinopathy, bilateral: Secondary | ICD-10-CM

## 2020-09-24 DIAGNOSIS — H3581 Retinal edema: Secondary | ICD-10-CM

## 2020-09-24 DIAGNOSIS — Z961 Presence of intraocular lens: Secondary | ICD-10-CM

## 2020-09-24 DIAGNOSIS — B20 Human immunodeficiency virus [HIV] disease: Secondary | ICD-10-CM

## 2020-09-25 ENCOUNTER — Encounter (INDEPENDENT_AMBULATORY_CARE_PROVIDER_SITE_OTHER): Payer: Self-pay | Admitting: Ophthalmology

## 2020-09-25 ENCOUNTER — Ambulatory Visit (INDEPENDENT_AMBULATORY_CARE_PROVIDER_SITE_OTHER): Payer: Medicaid Other | Admitting: Ophthalmology

## 2020-09-25 ENCOUNTER — Other Ambulatory Visit: Payer: Self-pay

## 2020-09-25 DIAGNOSIS — Z961 Presence of intraocular lens: Secondary | ICD-10-CM

## 2020-09-25 DIAGNOSIS — B20 Human immunodeficiency virus [HIV] disease: Secondary | ICD-10-CM

## 2020-09-25 DIAGNOSIS — H3322 Serous retinal detachment, left eye: Secondary | ICD-10-CM

## 2020-09-25 DIAGNOSIS — I10 Essential (primary) hypertension: Secondary | ICD-10-CM

## 2020-09-25 DIAGNOSIS — H3581 Retinal edema: Secondary | ICD-10-CM

## 2020-09-25 DIAGNOSIS — H35033 Hypertensive retinopathy, bilateral: Secondary | ICD-10-CM

## 2020-09-25 MED ORDER — PREDNISOLONE ACETATE 1 % OP SUSP: 1.0000 [drp] | OPHTHALMIC | 1 refills | Status: DC

## 2020-09-25 MED ORDER — HYDROCODONE-ACETAMINOPHEN 5-325 MG PO TABS: 1.0000 | ORAL_TABLET | ORAL | 0 refills | Status: DC | PRN

## 2020-09-25 MED ORDER — BACITRACIN-POLYMYXIN B 500-10000 UNIT/GM OP OINT: TOPICAL_OINTMENT | Freq: Four times a day (QID) | OPHTHALMIC | 3 refills | Status: DC
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
## 2020-09-29 ENCOUNTER — Other Ambulatory Visit (HOSPITAL_COMMUNITY): Payer: Self-pay
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
## 2020-10-01 ENCOUNTER — Encounter (INDEPENDENT_AMBULATORY_CARE_PROVIDER_SITE_OTHER): Payer: Self-pay | Admitting: Ophthalmology

## 2020-10-01 ENCOUNTER — Other Ambulatory Visit: Payer: Self-pay

## 2020-10-01 ENCOUNTER — Ambulatory Visit (INDEPENDENT_AMBULATORY_CARE_PROVIDER_SITE_OTHER): Payer: Medicaid Other | Admitting: Ophthalmology

## 2020-10-01 ENCOUNTER — Other Ambulatory Visit (HOSPITAL_COMMUNITY): Payer: Self-pay

## 2020-10-01 DIAGNOSIS — H35033 Hypertensive retinopathy, bilateral: Secondary | ICD-10-CM

## 2020-10-01 DIAGNOSIS — H3581 Retinal edema: Secondary | ICD-10-CM

## 2020-10-01 DIAGNOSIS — I10 Essential (primary) hypertension: Secondary | ICD-10-CM

## 2020-10-01 DIAGNOSIS — B20 Human immunodeficiency virus [HIV] disease: Secondary | ICD-10-CM

## 2020-10-01 DIAGNOSIS — H3322 Serous retinal detachment, left eye: Secondary | ICD-10-CM

## 2020-10-01 DIAGNOSIS — Z961 Presence of intraocular lens: Secondary | ICD-10-CM

## 2020-10-01 MED ORDER — PREDNISOLONE ACETATE 1 % OP SUSP: 1.0000 [drp] | OPHTHALMIC | 2 refills | Status: DC

## 2020-10-02 ENCOUNTER — Other Ambulatory Visit (HOSPITAL_COMMUNITY): Payer: Self-pay

## 2020-10-05 ENCOUNTER — Encounter (INDEPENDENT_AMBULATORY_CARE_PROVIDER_SITE_OTHER): Payer: Self-pay | Admitting: Ophthalmology

## 2020-10-08 ENCOUNTER — Other Ambulatory Visit (HOSPITAL_COMMUNITY): Payer: Self-pay

## 2020-10-08 MED FILL — Dolutegravir Sodium Tab 50 MG (Base Equiv): ORAL | 30 days supply | Qty: 60 | Fill #1 | Status: AC

## 2020-10-08 MED FILL — Darunavir-Cobic-Emtricitab-Tenofov AF Tab 800-150-200-10 MG: ORAL | 30 days supply | Qty: 30 | Fill #1 | Status: AC

## 2020-10-09 ENCOUNTER — Other Ambulatory Visit (HOSPITAL_COMMUNITY): Payer: Self-pay

## 2020-10-12 ENCOUNTER — Other Ambulatory Visit (HOSPITAL_COMMUNITY): Payer: Self-pay
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
## 2020-10-21 ENCOUNTER — Other Ambulatory Visit: Payer: Self-pay

## 2020-10-21 ENCOUNTER — Ambulatory Visit (INDEPENDENT_AMBULATORY_CARE_PROVIDER_SITE_OTHER): Payer: Medicaid Other | Admitting: Ophthalmology

## 2020-10-21 ENCOUNTER — Encounter (INDEPENDENT_AMBULATORY_CARE_PROVIDER_SITE_OTHER): Payer: Self-pay | Admitting: Ophthalmology

## 2020-10-21 DIAGNOSIS — H3322 Serous retinal detachment, left eye: Secondary | ICD-10-CM

## 2020-10-21 DIAGNOSIS — H35033 Hypertensive retinopathy, bilateral: Secondary | ICD-10-CM | POA: Diagnosis not present

## 2020-10-21 DIAGNOSIS — I10 Essential (primary) hypertension: Secondary | ICD-10-CM | POA: Diagnosis not present

## 2020-10-21 DIAGNOSIS — H3581 Retinal edema: Secondary | ICD-10-CM | POA: Diagnosis not present

## 2020-10-21 DIAGNOSIS — Z961 Presence of intraocular lens: Secondary | ICD-10-CM

## 2020-10-21 DIAGNOSIS — B20 Human immunodeficiency virus [HIV] disease: Secondary | ICD-10-CM

## 2020-10-25 ENCOUNTER — Encounter (INDEPENDENT_AMBULATORY_CARE_PROVIDER_SITE_OTHER): Payer: Self-pay | Admitting: Ophthalmology

## 2020-11-02 ENCOUNTER — Ambulatory Visit: Payer: Medicaid Other | Admitting: Infectious Disease

## 2020-11-03 ENCOUNTER — Other Ambulatory Visit (HOSPITAL_COMMUNITY): Payer: Self-pay

## 2020-11-04 ENCOUNTER — Other Ambulatory Visit (HOSPITAL_COMMUNITY): Payer: Self-pay

## 2020-11-09 ENCOUNTER — Other Ambulatory Visit (HOSPITAL_COMMUNITY): Payer: Self-pay

## 2020-11-09 ENCOUNTER — Ambulatory Visit: Payer: Medicaid Other | Admitting: Infectious Disease

## 2020-11-09 MED FILL — Darunavir-Cobic-Emtricitab-Tenofov AF Tab 800-150-200-10 MG: ORAL | 30 days supply | Qty: 30 | Fill #2 | Status: AC

## 2020-11-10 ENCOUNTER — Other Ambulatory Visit (HOSPITAL_COMMUNITY): Payer: Self-pay
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
## 2020-11-18 ENCOUNTER — Other Ambulatory Visit: Payer: Self-pay

## 2020-11-18 ENCOUNTER — Ambulatory Visit (INDEPENDENT_AMBULATORY_CARE_PROVIDER_SITE_OTHER): Payer: Medicaid Other | Admitting: Ophthalmology

## 2020-11-18 ENCOUNTER — Other Ambulatory Visit (HOSPITAL_COMMUNITY): Payer: Self-pay

## 2020-11-18 ENCOUNTER — Encounter (INDEPENDENT_AMBULATORY_CARE_PROVIDER_SITE_OTHER): Payer: Self-pay | Admitting: Ophthalmology

## 2020-11-18 DIAGNOSIS — Z961 Presence of intraocular lens: Secondary | ICD-10-CM

## 2020-11-18 DIAGNOSIS — H35033 Hypertensive retinopathy, bilateral: Secondary | ICD-10-CM | POA: Diagnosis not present

## 2020-11-18 DIAGNOSIS — I10 Essential (primary) hypertension: Secondary | ICD-10-CM | POA: Diagnosis not present

## 2020-11-18 DIAGNOSIS — H3322 Serous retinal detachment, left eye: Secondary | ICD-10-CM | POA: Diagnosis not present

## 2020-11-18 DIAGNOSIS — H3581 Retinal edema: Secondary | ICD-10-CM

## 2020-11-18 DIAGNOSIS — B20 Human immunodeficiency virus [HIV] disease: Secondary | ICD-10-CM

## 2020-12-03 ENCOUNTER — Other Ambulatory Visit (HOSPITAL_COMMUNITY): Payer: Self-pay

## 2020-12-03 MED FILL — Darunavir-Cobic-Emtricitab-Tenofov AF Tab 800-150-200-10 MG: ORAL | 30 days supply | Qty: 30 | Fill #3 | Status: AC

## 2020-12-03 MED FILL — Dolutegravir Sodium Tab 50 MG (Base Equiv): ORAL | 30 days supply | Qty: 60 | Fill #2 | Status: AC

## 2020-12-07 ENCOUNTER — Other Ambulatory Visit (HOSPITAL_COMMUNITY): Payer: Self-pay

## 2020-12-11 ENCOUNTER — Telehealth: Payer: Self-pay

## 2020-12-11 ENCOUNTER — Ambulatory Visit: Payer: Medicaid Other | Admitting: Infectious Disease

## 2020-12-11 NOTE — Telephone Encounter (Signed)
Called patient - left voice message asking if she is able to make it to appointment this morning or if she needs to reschedule to call our office back.   [place], CMA
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
## 2020-12-16 ENCOUNTER — Encounter (INDEPENDENT_AMBULATORY_CARE_PROVIDER_SITE_OTHER): Payer: Self-pay | Admitting: Ophthalmology

## 2020-12-16 ENCOUNTER — Other Ambulatory Visit: Payer: Self-pay

## 2020-12-16 ENCOUNTER — Ambulatory Visit (INDEPENDENT_AMBULATORY_CARE_PROVIDER_SITE_OTHER): Payer: Medicaid Other | Admitting: Ophthalmology

## 2020-12-16 DIAGNOSIS — I10 Essential (primary) hypertension: Secondary | ICD-10-CM | POA: Diagnosis not present

## 2020-12-16 DIAGNOSIS — H3581 Retinal edema: Secondary | ICD-10-CM | POA: Diagnosis not present

## 2020-12-16 DIAGNOSIS — H35033 Hypertensive retinopathy, bilateral: Secondary | ICD-10-CM

## 2020-12-16 DIAGNOSIS — H3322 Serous retinal detachment, left eye: Secondary | ICD-10-CM | POA: Diagnosis not present

## 2020-12-16 DIAGNOSIS — B20 Human immunodeficiency virus [HIV] disease: Secondary | ICD-10-CM

## 2020-12-16 DIAGNOSIS — Z961 Presence of intraocular lens: Secondary | ICD-10-CM

## 2020-12-28 ENCOUNTER — Other Ambulatory Visit (HOSPITAL_COMMUNITY): Payer: Self-pay

## 2020-12-30 ENCOUNTER — Other Ambulatory Visit (HOSPITAL_COMMUNITY): Payer: Self-pay

## 2020-12-30 MED FILL — Darunavir-Cobic-Emtricitab-Tenofov AF Tab 800-150-200-10 MG: ORAL | 30 days supply | Qty: 30 | Fill #4 | Status: CN

## 2020-12-30 MED FILL — Dolutegravir Sodium Tab 50 MG (Base Equiv): ORAL | 30 days supply | Qty: 60 | Fill #3 | Status: AC

## 2020-12-31 ENCOUNTER — Telehealth (INDEPENDENT_AMBULATORY_CARE_PROVIDER_SITE_OTHER): Payer: Medicaid Other | Admitting: Infectious Disease

## 2020-12-31 ENCOUNTER — Other Ambulatory Visit: Payer: Self-pay

## 2020-12-31 ENCOUNTER — Encounter: Payer: Self-pay | Admitting: Infectious Disease

## 2020-12-31 ENCOUNTER — Other Ambulatory Visit (HOSPITAL_COMMUNITY): Payer: Self-pay

## 2020-12-31 DIAGNOSIS — A5149 Other secondary syphilitic conditions: Secondary | ICD-10-CM

## 2020-12-31 DIAGNOSIS — E876 Hypokalemia: Secondary | ICD-10-CM | POA: Diagnosis not present

## 2020-12-31 DIAGNOSIS — H3320 Serous retinal detachment, unspecified eye: Secondary | ICD-10-CM

## 2020-12-31 DIAGNOSIS — B20 Human immunodeficiency virus [HIV] disease: Secondary | ICD-10-CM

## 2020-12-31 DIAGNOSIS — I10 Essential (primary) hypertension: Secondary | ICD-10-CM | POA: Diagnosis not present

## 2020-12-31 DIAGNOSIS — Z789 Other specified health status: Secondary | ICD-10-CM | POA: Diagnosis not present

## 2020-12-31 DIAGNOSIS — H3322 Serous retinal detachment, left eye: Secondary | ICD-10-CM | POA: Insufficient documentation

## 2020-12-31 HISTORY — DX: Serous retinal detachment, unspecified eye: H33.20

## 2020-12-31 MED ORDER — DOXYCYCLINE HYCLATE 100 MG PO TABS
ORAL_TABLET | ORAL | 6 refills | Status: DC
  Filled 2020-12-31: qty 60, 30d supply, fill #0

## 2020-12-31 MED ORDER — DOLUTEGRAVIR SODIUM 50 MG PO TABS
50.0000 mg | ORAL_TABLET | Freq: Two times a day (BID) | ORAL | 11 refills | Status: DC
  Filled 2020-12-31: qty 60, 30d supply, fill #0

## 2020-12-31 MED ORDER — ESTRADIOL VALERATE 20 MG/ML IM OIL
TOPICAL_OIL | INTRAMUSCULAR | 6 refills | Status: DC
  Filled 2020-12-31: qty 5, 28d supply, fill #0

## 2020-12-31 MED ORDER — SPIRONOLACTONE 100 MG PO TABS
ORAL_TABLET | Freq: Every day | ORAL | 11 refills | Status: DC
  Filled 2020-12-31: qty 30, 30d supply, fill #0
  Filled 2021-01-28: qty 30, 30d supply, fill #1

## 2020-12-31 MED ORDER — SYMTUZA 800-150-200-10 MG PO TABS
1.0000 | ORAL_TABLET | Freq: Every day | ORAL | 11 refills | Status: DC
  Filled 2020-12-31: qty 30, 30d supply, fill #0
  Filled 2021-01-28: qty 30, 30d supply, fill #1
  Filled 2021-02-26: qty 30, 30d supply, fill #2
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
## 2021-01-04 ENCOUNTER — Other Ambulatory Visit (HOSPITAL_COMMUNITY): Payer: Self-pay

## 2021-01-15 ENCOUNTER — Other Ambulatory Visit: Payer: Self-pay

## 2021-01-15 ENCOUNTER — Other Ambulatory Visit (HOSPITAL_COMMUNITY)
Admission: RE | Admit: 2021-01-15 | Discharge: 2021-01-15 | Disposition: A | Discharge status: Home / Self Care | Payer: Medicaid Other | Source: Ambulatory Visit | Attending: Infectious Disease | Admitting: Infectious Disease

## 2021-01-15 ENCOUNTER — Encounter: Payer: Self-pay | Admitting: Infectious Disease

## 2021-01-15 ENCOUNTER — Ambulatory Visit (INDEPENDENT_AMBULATORY_CARE_PROVIDER_SITE_OTHER): Payer: Medicaid Other | Admitting: Infectious Disease

## 2021-01-15 VITALS — BP 126/85 | HR 93 | Temp 97.9°F | Ht 71.0 in | Wt 153.0 lb

## 2021-01-15 DIAGNOSIS — H35033 Hypertensive retinopathy, bilateral: Secondary | ICD-10-CM | POA: Diagnosis not present

## 2021-01-15 DIAGNOSIS — E876 Hypokalemia: Secondary | ICD-10-CM | POA: Diagnosis not present

## 2021-01-15 DIAGNOSIS — Z789 Other specified health status: Secondary | ICD-10-CM | POA: Diagnosis not present

## 2021-01-15 DIAGNOSIS — H3322 Serous retinal detachment, left eye: Secondary | ICD-10-CM

## 2021-01-15 DIAGNOSIS — B20 Human immunodeficiency virus [HIV] disease: Secondary | ICD-10-CM | POA: Diagnosis not present

## 2021-01-15 DIAGNOSIS — Z7185 Encounter for immunization safety counseling: Secondary | ICD-10-CM | POA: Diagnosis not present

## 2021-01-15 DIAGNOSIS — H35039 Hypertensive retinopathy, unspecified eye: Secondary | ICD-10-CM | POA: Insufficient documentation

## 2021-01-15 DIAGNOSIS — Z23 Encounter for immunization: Secondary | ICD-10-CM

## 2021-01-15 DIAGNOSIS — I10 Essential (primary) hypertension: Secondary | ICD-10-CM | POA: Diagnosis not present

## 2021-01-15 DIAGNOSIS — A5149 Other secondary syphilitic conditions: Secondary | ICD-10-CM | POA: Diagnosis not present

## 2021-01-15 HISTORY — DX: Hypertensive retinopathy, unspecified eye: H35.039

## 2021-01-15 NOTE — Progress Notes (Signed)
    [facility]  Name:  Stephen Griffin    MRN: [id number] DOB: 07-Feb-1981   01/15/2021  Stephen Griffin was observed post JYNNEOS immunization for 15 minutes without incident. She was provided with Vaccine Information Sheet and instruction to access the V-Safe system.   Stephen Griffin was instructed to call 911 with any severe reactions post vaccine: Difficulty breathing  Swelling of face and throat  A fast heartbeat  A bad rash all over body  Dizziness and weakness     Beryle Flock, RN
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
## 2021-01-18 ENCOUNTER — Telehealth: Payer: Self-pay | Admitting: Infectious Disease

## 2021-01-18 DIAGNOSIS — Z79899 Other long term (current) drug therapy: Secondary | ICD-10-CM

## 2021-01-18 DIAGNOSIS — Z789 Other specified health status: Secondary | ICD-10-CM

## 2021-01-18 LAB — CYTOLOGY, (ORAL, ANAL, URETHRAL) ANCILLARY ONLY
Chlamydia: NEGATIVE
Chlamydia: NEGATIVE
Comment: NEGATIVE
Comment: NEGATIVE
Comment: NORMAL
Comment: NORMAL
Neisseria Gonorrhea: NEGATIVE
Neisseria Gonorrhea: NEGATIVE

## 2021-01-18 LAB — URINE CYTOLOGY ANCILLARY ONLY
Chlamydia: NEGATIVE
Comment: NEGATIVE
Comment: NORMAL
Neisseria Gonorrhea: NEGATIVE

## 2021-01-18 NOTE — Telephone Encounter (Signed)
Is chocolate taking '100mg'$  of aldactone or '150mg'$ ?  If she is on 150 I want to go to '200mg'$  and recheck a  BMP in 2 weeks or so  If she is on 100 then to 150 then visit with BP check BMP and if still ok bp and labs can try to get to '200mg'$  for testosterone blockade

## 2021-01-19 NOTE — Telephone Encounter (Signed)
Sent patient MyChart message; called both numbers in chart and unable to reach patient.   Landry Lookingbill Lorita Officer, RN

## 2021-01-20 LAB — COMPLETE METABOLIC PANEL WITH GFR
AG Ratio: 1.4 (calc) (ref 1.0–2.5)
ALT: 32 U/L (ref 9–46)
AST: 30 U/L (ref 10–40)
Albumin: 4.6 g/dL (ref 3.6–5.1)
Alkaline phosphatase (APISO): 79 U/L (ref 36–130)
BUN: 11 mg/dL (ref 7–25)
CO2: 25 mmol/L (ref 20–32)
Calcium: 10.1 mg/dL (ref 8.6–10.3)
Chloride: 107 mmol/L (ref 98–110)
Creat: 1.08 mg/dL (ref 0.60–1.29)
Globulin: 3.3 g/dL (calc) (ref 1.9–3.7)
Glucose, Bld: 71 mg/dL (ref 65–99)
Potassium: 4 mmol/L (ref 3.5–5.3)
Sodium: 142 mmol/L (ref 135–146)
Total Bilirubin: 0.3 mg/dL (ref 0.2–1.2)
Total Protein: 7.9 g/dL (ref 6.1–8.1)
eGFR: 89 mL/min/{1.73_m2} (ref >=60)

## 2021-01-20 LAB — CBC WITH DIFFERENTIAL/PLATELET
Absolute Monocytes: 485 cells/uL (ref 200–950)
Basophils Absolute: 29 cells/uL (ref 0–200)
Basophils Relative: 0.5 %
Eosinophils Absolute: 40 cells/uL (ref 15–500)
Eosinophils Relative: 0.7 %
HCT: 47 % (ref 38.5–50.0)
Hemoglobin: 15.7 g/dL (ref 13.2–17.1)
Lymphs Abs: 2234 cells/uL (ref 850–3900)
MCH: 32.7 pg (ref 27.0–33.0)
MCHC: 33.4 g/dL (ref 32.0–36.0)
MCV: 97.9 fL (ref 80.0–100.0)
MPV: 9.9 fL (ref 7.5–12.5)
Monocytes Relative: 8.5 %
Neutro Abs: 2913 cells/uL (ref 1500–7800)
Neutrophils Relative %: 51.1 %
Platelets: 430 10*3/uL — ABNORMAL HIGH (ref 140–400)
RBC: 4.8 10*6/uL (ref 4.20–5.80)
RDW: 11.4 % (ref 11.0–15.0)
Total Lymphocyte: 39.2 %
WBC: 5.7 10*3/uL (ref 3.8–10.8)

## 2021-01-20 LAB — HIV RNA, RTPCR W/R GT (RTI, PI,INT)
HIV 1 RNA Quant: <20 copies/mL — AB
HIV-1 RNA Quant, Log: <1.3 Log copies/mL — AB

## 2021-01-20 LAB — T-HELPER CELLS (CD4) COUNT (NOT AT ARMC)
Absolute CD4: 711 cells/uL (ref 490–1740)
CD4 T Helper %: 31 % (ref 30–61)
Total lymphocyte count: 2268 cells/uL (ref 850–3900)

## 2021-01-20 LAB — ESTRADIOL: Estradiol: 45 pg/mL — ABNORMAL HIGH (ref <=39)

## 2021-01-20 LAB — TESTOSTERONE: Testosterone: 726 ng/dL (ref 250–827)

## 2021-01-20 LAB — RPR: RPR Ser Ql: REACTIVE — AB

## 2021-01-20 LAB — RPR TITER: RPR Titer: 1:2 {titer} — ABNORMAL HIGH

## 2021-01-20 LAB — FLUORESCENT TREPONEMAL AB(FTA)-IGG-BLD: Fluorescent Treponemal ABS: REACTIVE — AB

## 2021-01-25 ENCOUNTER — Other Ambulatory Visit (HOSPITAL_COMMUNITY): Payer: Self-pay

## 2021-01-25 ENCOUNTER — Other Ambulatory Visit: Payer: Self-pay | Admitting: Infectious Disease

## 2021-01-25 MED ORDER — SPIRONOLACTONE 50 MG PO TABS
50.0000 mg | ORAL_TABLET | Freq: Every day | ORAL | 11 refills | Status: DC
  Filled 2021-01-25: qty 30, 30d supply, fill #0

## 2021-01-25 NOTE — Addendum Note (Signed)
Addended by: Lucie Leather D on: 01/25/2021 10:48 AM   Modules accepted: Orders

## 2021-01-25 NOTE — Telephone Encounter (Signed)
Spoke with patient, she is currently taking '100mg'$  aldactone. Advised her Dr. Tommy Medal would like to increase her dose to '150mg'$  and that new prescription will be sent in. Scheduled for labs and BP check on 02/08/21. Patient verbalized understanding and has no further questions.   Beryle Flock, RN

## 2021-01-27 ENCOUNTER — Encounter (INDEPENDENT_AMBULATORY_CARE_PROVIDER_SITE_OTHER): Payer: Medicaid Other | Admitting: Ophthalmology

## 2021-01-28 ENCOUNTER — Other Ambulatory Visit (HOSPITAL_COMMUNITY): Payer: Self-pay

## 2021-01-28 MED FILL — Dolutegravir Sodium Tab 50 MG (Base Equiv): ORAL | 30 days supply | Qty: 60 | Fill #4 | Status: AC

## 2021-01-29 ENCOUNTER — Other Ambulatory Visit (HOSPITAL_COMMUNITY): Payer: Self-pay

## 2021-02-08 ENCOUNTER — Other Ambulatory Visit: Payer: Medicaid Other

## 2021-02-09 ENCOUNTER — Encounter (INDEPENDENT_AMBULATORY_CARE_PROVIDER_SITE_OTHER): Payer: Medicaid Other | Admitting: Ophthalmology

## 2021-02-12 ENCOUNTER — Ambulatory Visit: Payer: Medicaid Other

## 2021-02-12 ENCOUNTER — Ambulatory Visit (INDEPENDENT_AMBULATORY_CARE_PROVIDER_SITE_OTHER): Payer: Medicaid Other

## 2021-02-12 ENCOUNTER — Other Ambulatory Visit: Payer: Self-pay

## 2021-02-12 DIAGNOSIS — Z23 Encounter for immunization: Secondary | ICD-10-CM

## 2021-02-12 NOTE — Addendum Note (Signed)
Addended by: Eugenia Mcalpine on: 02/12/2021 11:44 AM   Modules accepted: Orders

## 2021-02-12 NOTE — Progress Notes (Signed)
    [facility]  Name:  Stephen Griffin    MRN: [id number] DOB: 1980/08/21   02/12/2021  [name] was observed post  2nd JYNNEOS immunization for 15 minutes without incident. She was provided with Vaccine Information Sheet and instruction to access the V-Safe system.   [name] was instructed to call 911 with any severe reactions post vaccine: Difficulty breathing  Swelling of face and throat  A fast heartbeat  A bad rash all over body  Dizziness and weakness

## 2021-02-12 NOTE — Addendum Note (Signed)
Addended by: Eugenia Mcalpine on: 02/12/2021 01:01 PM   Modules accepted: Orders

## 2021-02-23 ENCOUNTER — Other Ambulatory Visit (HOSPITAL_COMMUNITY): Payer: Self-pay

## 2021-02-26 ENCOUNTER — Other Ambulatory Visit (HOSPITAL_COMMUNITY): Payer: Self-pay

## 2021-02-26 MED FILL — Dolutegravir Sodium Tab 50 MG (Base Equiv): ORAL | 30 days supply | Qty: 60 | Fill #5 | Status: AC

## 2021-02-26 MED FILL — Darunavir-Cobic-Emtricitab-Tenofov AF Tab 800-150-200-10 MG: ORAL | 30 days supply | Qty: 30 | Fill #4 | Status: CN

## 2021-03-08 ENCOUNTER — Encounter (INDEPENDENT_AMBULATORY_CARE_PROVIDER_SITE_OTHER): Payer: Medicaid Other | Admitting: Ophthalmology

## 2021-03-08 DIAGNOSIS — H35033 Hypertensive retinopathy, bilateral: Secondary | ICD-10-CM

## 2021-03-08 DIAGNOSIS — H3581 Retinal edema: Secondary | ICD-10-CM

## 2021-03-08 DIAGNOSIS — H3322 Serous retinal detachment, left eye: Secondary | ICD-10-CM

## 2021-03-08 DIAGNOSIS — Z961 Presence of intraocular lens: Secondary | ICD-10-CM

## 2021-03-08 DIAGNOSIS — B20 Human immunodeficiency virus [HIV] disease: Secondary | ICD-10-CM

## 2021-03-08 DIAGNOSIS — H268 Other specified cataract: Secondary | ICD-10-CM

## 2021-03-08 DIAGNOSIS — I10 Essential (primary) hypertension: Secondary | ICD-10-CM

## 2021-03-23 ENCOUNTER — Other Ambulatory Visit (HOSPITAL_COMMUNITY): Payer: Self-pay

## 2021-03-25 ENCOUNTER — Other Ambulatory Visit (HOSPITAL_COMMUNITY): Payer: Self-pay

## 2021-04-09 ENCOUNTER — Ambulatory Visit: Payer: Medicaid Other | Admitting: Infectious Disease

## 2021-05-07 ENCOUNTER — Other Ambulatory Visit (HOSPITAL_COMMUNITY)
Admission: RE | Admit: 2021-05-07 | Discharge: 2021-05-07 | Disposition: A | Discharge status: Home / Self Care | Payer: Medicaid Other | Source: Ambulatory Visit | Attending: Infectious Disease | Admitting: Infectious Disease

## 2021-05-07 ENCOUNTER — Encounter: Payer: Self-pay | Admitting: Infectious Disease

## 2021-05-07 ENCOUNTER — Ambulatory Visit (INDEPENDENT_AMBULATORY_CARE_PROVIDER_SITE_OTHER): Payer: Medicaid Other | Admitting: Infectious Disease

## 2021-05-07 ENCOUNTER — Other Ambulatory Visit: Payer: Self-pay

## 2021-05-07 ENCOUNTER — Other Ambulatory Visit (HOSPITAL_COMMUNITY): Payer: Self-pay

## 2021-05-07 VITALS — BP 144/91 | HR 105 | Temp 97.8°F | Wt 157.0 lb

## 2021-05-07 DIAGNOSIS — Z23 Encounter for immunization: Secondary | ICD-10-CM

## 2021-05-07 DIAGNOSIS — Z113 Encounter for screening for infections with a predominantly sexual mode of transmission: Secondary | ICD-10-CM | POA: Insufficient documentation

## 2021-05-07 DIAGNOSIS — I10 Essential (primary) hypertension: Secondary | ICD-10-CM | POA: Diagnosis not present

## 2021-05-07 DIAGNOSIS — B20 Human immunodeficiency virus [HIV] disease: Secondary | ICD-10-CM | POA: Diagnosis not present

## 2021-05-07 DIAGNOSIS — Z789 Other specified health status: Secondary | ICD-10-CM | POA: Diagnosis not present

## 2021-05-07 DIAGNOSIS — H3322 Serous retinal detachment, left eye: Secondary | ICD-10-CM

## 2021-05-07 MED ORDER — ESTRADIOL VALERATE 20 MG/ML IM OIL
TOPICAL_OIL | INTRAMUSCULAR | 6 refills | Status: DC
Start: 2021-05-07 — End: 2022-02-16
  Filled 2021-05-07: qty 5, 28d supply, fill #0
  Filled 2021-07-15: qty 5, 28d supply, fill #1
  Filled 2021-08-10: qty 5, 28d supply, fill #2

## 2021-05-07 MED ORDER — DOXYCYCLINE HYCLATE 100 MG PO TABS
ORAL_TABLET | ORAL | 6 refills | Status: DC
Start: 2021-05-07 — End: 2021-10-04
  Filled 2021-05-07: qty 60, 30d supply, fill #0

## 2021-05-07 MED ORDER — DOLUTEGRAVIR SODIUM 50 MG PO TABS
50.0000 mg | ORAL_TABLET | Freq: Two times a day (BID) | ORAL | 11 refills | Status: DC
  Filled 2021-05-07 – 2021-07-15 (×2): qty 60, 30d supply, fill #0
  Filled 2021-08-10: qty 60, 30d supply, fill #1
  Filled 2021-09-03: qty 60, 30d supply, fill #2
  Filled 2021-09-30: qty 60, 30d supply, fill #3
  Filled 2021-10-28: qty 60, 30d supply, fill #4
  Filled 2021-11-23: qty 60, 30d supply, fill #5
  Filled 2021-12-27: qty 60, 30d supply, fill #6
  Filled 2022-01-18: qty 60, 30d supply, fill #7

## 2021-05-07 MED ORDER — SPIRONOLACTONE 100 MG PO TABS
ORAL_TABLET | Freq: Every day | ORAL | 11 refills | Status: DC
  Filled 2021-05-07: qty 30, 30d supply, fill #0
  Filled 2021-07-15: qty 30, 30d supply, fill #1
  Filled 2021-08-10: qty 30, 30d supply, fill #2

## 2021-05-07 MED ORDER — SYMTUZA 800-150-200-10 MG PO TABS
1.0000 | ORAL_TABLET | Freq: Every day | ORAL | 11 refills | Status: DC
  Filled 2021-05-07 – 2021-07-15 (×2): qty 30, 30d supply, fill #0
  Filled 2021-08-10: qty 30, 30d supply, fill #1
  Filled 2021-09-03: qty 30, 30d supply, fill #2
  Filled 2021-09-30: qty 30, 30d supply, fill #3
  Filled 2021-10-28: qty 30, 30d supply, fill #4
  Filled 2021-11-23: qty 30, 30d supply, fill #5
  Filled 2021-12-27: qty 30, 30d supply, fill #6
  Filled 2022-01-18: qty 30, 30d supply, fill #7

## 2021-05-07 MED ORDER — SPIRONOLACTONE 50 MG PO TABS
50.0000 mg | ORAL_TABLET | Freq: Every day | ORAL | 11 refills | Status: DC
  Filled 2021-05-07: qty 30, 30d supply, fill #0
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
## 2021-05-10 ENCOUNTER — Telehealth: Payer: Self-pay

## 2021-05-10 LAB — URINE CYTOLOGY ANCILLARY ONLY
Chlamydia: NEGATIVE
Comment: NEGATIVE
Comment: NORMAL
Neisseria Gonorrhea: NEGATIVE

## 2021-05-10 LAB — CYTOLOGY, (ORAL, ANAL, URETHRAL) ANCILLARY ONLY
Chlamydia: NEGATIVE
Chlamydia: NEGATIVE
Comment: NEGATIVE
Comment: NEGATIVE
Comment: NORMAL
Comment: NORMAL
Neisseria Gonorrhea: NEGATIVE
Neisseria Gonorrhea: NEGATIVE

## 2021-05-10 NOTE — Telephone Encounter (Signed)
Called patient to schedule lab appointment. No answer and unable to leave voicemail.   Beryle Flock, RN

## 2021-05-10 NOTE — Telephone Encounter (Signed)
-----   Message from Truman Hayward, MD sent at 05/10/2021  8:27 AM EST ----- Chocolate needs to come back for repeat VL after being on her meds another 10 days ----- Message ----- From: [place] [facility] Lab Results In Sent: 05/07/2021  11:21 PM EST To: Truman Hayward, MD

## 2021-05-11 LAB — COMPLETE METABOLIC PANEL WITH GFR
AG Ratio: 1.2 (calc) (ref 1.0–2.5)
ALT: 23 U/L (ref 9–46)
AST: 27 U/L (ref 10–40)
Albumin: 4.4 g/dL (ref 3.6–5.1)
Alkaline phosphatase (APISO): 101 U/L (ref 36–130)
BUN: 11 mg/dL (ref 7–25)
CO2: 21 mmol/L (ref 20–32)
Calcium: 9.5 mg/dL (ref 8.6–10.3)
Chloride: 106 mmol/L (ref 98–110)
Creat: 0.79 mg/dL (ref 0.60–1.29)
Globulin: 3.7 g/dL (calc) (ref 1.9–3.7)
Glucose, Bld: 81 mg/dL (ref 65–99)
Potassium: 3.7 mmol/L (ref 3.5–5.3)
Sodium: 140 mmol/L (ref 135–146)
Total Bilirubin: 0.3 mg/dL (ref 0.2–1.2)
Total Protein: 8.1 g/dL (ref 6.1–8.1)
eGFR: 115 mL/min/{1.73_m2} (ref >=60)

## 2021-05-11 LAB — FLUORESCENT TREPONEMAL AB(FTA)-IGG-BLD: Fluorescent Treponemal ABS: REACTIVE — AB

## 2021-05-11 LAB — HIV-1 RNA QUANT-NO REFLEX-BLD
HIV 1 RNA Quant: 350 Copies/mL — ABNORMAL HIGH
HIV-1 RNA Quant, Log: 2.54 Log cps/mL — ABNORMAL HIGH

## 2021-05-11 LAB — CBC WITH DIFFERENTIAL/PLATELET
Absolute Monocytes: 747 cells/uL (ref 200–950)
Basophils Absolute: 54 cells/uL (ref 0–200)
Basophils Relative: 0.6 %
Eosinophils Absolute: 54 cells/uL (ref 15–500)
Eosinophils Relative: 0.6 %
HCT: 44.7 % (ref 38.5–50.0)
Hemoglobin: 15 g/dL (ref 13.2–17.1)
Lymphs Abs: 2151 cells/uL (ref 850–3900)
MCH: 33 pg (ref 27.0–33.0)
MCHC: 33.6 g/dL (ref 32.0–36.0)
MCV: 98.5 fL (ref 80.0–100.0)
MPV: 9.5 fL (ref 7.5–12.5)
Monocytes Relative: 8.3 %
Neutro Abs: 5994 cells/uL (ref 1500–7800)
Neutrophils Relative %: 66.6 %
Platelets: 403 10*3/uL — ABNORMAL HIGH (ref 140–400)
RBC: 4.54 10*6/uL (ref 4.20–5.80)
RDW: 11.6 % (ref 11.0–15.0)
Total Lymphocyte: 23.9 %
WBC: 9 10*3/uL (ref 3.8–10.8)

## 2021-05-11 LAB — T-HELPER CELLS (CD4) COUNT (NOT AT ARMC)
Absolute CD4: 643 cells/uL (ref 490–1740)
CD4 T Helper %: 28 % — ABNORMAL LOW (ref 30–61)
Total lymphocyte count: 2315 cells/uL (ref 850–3900)

## 2021-05-11 LAB — ESTRADIOL: Estradiol: 27 pg/mL (ref <=39)

## 2021-05-11 LAB — RPR TITER: RPR Titer: 1:2 {titer} — ABNORMAL HIGH

## 2021-05-11 LAB — TESTOSTERONE: Testosterone: 402 ng/dL (ref 250–827)

## 2021-05-11 LAB — RPR: RPR Ser Ql: REACTIVE — AB

## 2021-05-11 NOTE — Telephone Encounter (Signed)
Called patient, no answer and mailbox full.   Beryle Flock, RN

## 2021-05-12 ENCOUNTER — Other Ambulatory Visit: Payer: Self-pay

## 2021-05-12 NOTE — Telephone Encounter (Signed)
Patient advised and scheduled for a lab appointment

## 2021-05-20 ENCOUNTER — Other Ambulatory Visit: Payer: Self-pay

## 2021-05-20 DIAGNOSIS — B20 Human immunodeficiency virus [HIV] disease: Secondary | ICD-10-CM

## 2021-05-27 ENCOUNTER — Other Ambulatory Visit: Payer: Medicaid Other

## 2021-06-17 ENCOUNTER — Other Ambulatory Visit: Payer: Medicaid Other

## 2021-06-17 ENCOUNTER — Other Ambulatory Visit: Payer: Self-pay

## 2021-06-17 DIAGNOSIS — Z79899 Other long term (current) drug therapy: Secondary | ICD-10-CM

## 2021-06-17 DIAGNOSIS — B20 Human immunodeficiency virus [HIV] disease: Secondary | ICD-10-CM

## 2021-06-17 DIAGNOSIS — Z789 Other specified health status: Secondary | ICD-10-CM

## 2021-06-19 LAB — BASIC METABOLIC PANEL
BUN: 9 mg/dL (ref 7–25)
CO2: 32 mmol/L (ref 20–32)
Calcium: 9.4 mg/dL (ref 8.6–10.3)
Chloride: 105 mmol/L (ref 98–110)
Creat: 1.03 mg/dL (ref 0.60–1.29)
Glucose, Bld: 90 mg/dL (ref 65–99)
Potassium: 3.6 mmol/L (ref 3.5–5.3)
Sodium: 141 mmol/L (ref 135–146)

## 2021-06-19 LAB — HIV-1 RNA QUANT-NO REFLEX-BLD
HIV 1 RNA Quant: 25 Copies/mL — ABNORMAL HIGH
HIV-1 RNA Quant, Log: 1.4 Log cps/mL — ABNORMAL HIGH

## 2021-07-15 ENCOUNTER — Other Ambulatory Visit (HOSPITAL_COMMUNITY): Payer: Self-pay

## 2021-07-16 ENCOUNTER — Other Ambulatory Visit (HOSPITAL_COMMUNITY): Payer: Self-pay

## 2021-07-20 ENCOUNTER — Other Ambulatory Visit (HOSPITAL_COMMUNITY): Payer: Self-pay

## 2021-07-21 ENCOUNTER — Other Ambulatory Visit (HOSPITAL_COMMUNITY): Payer: Self-pay

## 2021-08-10 ENCOUNTER — Other Ambulatory Visit (HOSPITAL_COMMUNITY): Payer: Self-pay

## 2021-08-12 ENCOUNTER — Other Ambulatory Visit (HOSPITAL_COMMUNITY): Payer: Self-pay

## 2021-09-03 ENCOUNTER — Other Ambulatory Visit (HOSPITAL_COMMUNITY): Payer: Self-pay

## 2021-09-06 ENCOUNTER — Ambulatory Visit: Payer: Medicaid Other | Admitting: Infectious Disease

## 2021-09-06 ENCOUNTER — Other Ambulatory Visit (HOSPITAL_COMMUNITY): Payer: Self-pay

## 2021-09-13 ENCOUNTER — Ambulatory Visit (INDEPENDENT_AMBULATORY_CARE_PROVIDER_SITE_OTHER): Payer: Medicaid Other | Admitting: Infectious Disease

## 2021-09-13 ENCOUNTER — Other Ambulatory Visit: Payer: Self-pay

## 2021-09-13 ENCOUNTER — Other Ambulatory Visit (HOSPITAL_COMMUNITY): Payer: Self-pay

## 2021-09-13 ENCOUNTER — Other Ambulatory Visit (HOSPITAL_COMMUNITY)
Admission: RE | Admit: 2021-09-13 | Discharge: 2021-09-13 | Disposition: A | Discharge status: Home / Self Care | Payer: Medicaid Other | Source: Ambulatory Visit | Attending: Infectious Disease | Admitting: Infectious Disease

## 2021-09-13 ENCOUNTER — Encounter: Payer: Self-pay | Admitting: Infectious Disease

## 2021-09-13 VITALS — BP 147/102 | HR 92 | Temp 98.5°F | Ht 71.0 in | Wt 164.0 lb

## 2021-09-13 DIAGNOSIS — A5149 Other secondary syphilitic conditions: Secondary | ICD-10-CM

## 2021-09-13 DIAGNOSIS — H35033 Hypertensive retinopathy, bilateral: Secondary | ICD-10-CM

## 2021-09-13 DIAGNOSIS — Z789 Other specified health status: Secondary | ICD-10-CM

## 2021-09-13 DIAGNOSIS — H3322 Serous retinal detachment, left eye: Secondary | ICD-10-CM | POA: Diagnosis not present

## 2021-09-13 DIAGNOSIS — B20 Human immunodeficiency virus [HIV] disease: Secondary | ICD-10-CM

## 2021-09-13 DIAGNOSIS — R197 Diarrhea, unspecified: Secondary | ICD-10-CM

## 2021-09-13 DIAGNOSIS — R110 Nausea: Secondary | ICD-10-CM | POA: Diagnosis not present

## 2021-09-13 MED ORDER — AMLODIPINE BESYLATE 5 MG PO TABS
5.0000 mg | ORAL_TABLET | Freq: Every day | ORAL | 11 refills | Status: DC
  Filled 2021-09-13: qty 30, 30d supply, fill #0
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
## 2021-09-14 ENCOUNTER — Telehealth: Payer: Self-pay

## 2021-09-14 LAB — CYTOLOGY, (ORAL, ANAL, URETHRAL) ANCILLARY ONLY
Chlamydia: NEGATIVE
Chlamydia: NEGATIVE
Comment: NEGATIVE
Comment: NORMAL
Comment: NEGATIVE
Comment: NORMAL
Neisseria Gonorrhea: NEGATIVE
Neisseria Gonorrhea: NEGATIVE

## 2021-09-14 LAB — URINE CYTOLOGY ANCILLARY ONLY
Chlamydia: NEGATIVE
Comment: NEGATIVE
Comment: NORMAL
Neisseria Gonorrhea: NEGATIVE

## 2021-09-14 NOTE — Telephone Encounter (Signed)
I spoke to the patient and relayed the lab results to the patient. Patient reports little alcohol use 4 times a week. Patient scheduled next week for a hepatitis panel. ?Odaly Peri T Zalen Sequeira ? ?

## 2021-09-14 NOTE — Telephone Encounter (Signed)
-----   Message from Truman Hayward, MD sent at 09/14/2021  1:55 PM EDT ----- ?Can we bring CHocolate back for an acute hepatitis panel. I am worried she may have hep C. COuld also have elevated lFTS from alcohol if she is drinking ?----- Message ----- ?From: Interface, [facility] Lab Results In ?Sent: 09/13/2021  10:44 PM EDT ?To: Truman Hayward, MD ? ? ?

## 2021-09-15 LAB — HELPER T-LYMPH-CD4 (ARMC ONLY)
% CD 4 Pos. Lymph.: 26.6 % — ABNORMAL LOW (ref 30.8–58.5)
Absolute CD 4 Helper: 585 /uL (ref 359–1519)
Basophils Absolute: 0.1 10*3/uL (ref 0.0–0.2)
Basos: 1 %
EOS (ABSOLUTE): 0.1 10*3/uL (ref 0.0–0.4)
Eos: 2 %
Hematocrit: 48.3 % (ref 37.5–51.0)
Hemoglobin: 15.4 g/dL (ref 13.0–17.7)
Immature Grans (Abs): 0 10*3/uL (ref 0.0–0.1)
Immature Granulocytes: 1 %
Lymphocytes Absolute: 2.2 10*3/uL (ref 0.7–3.1)
Lymphs: 33 %
MCH: 31.2 pg (ref 26.6–33.0)
MCHC: 31.9 g/dL (ref 31.5–35.7)
MCV: 98 fL — ABNORMAL HIGH (ref 79–97)
Monocytes Absolute: 0.7 10*3/uL (ref 0.1–0.9)
Monocytes: 11 %
Neutrophils Absolute: 3.5 10*3/uL (ref 1.4–7.0)
Neutrophils: 52 %
Platelets: 413 10*3/uL (ref 150–450)
RBC: 4.94 x10E6/uL (ref 4.14–5.80)
RDW: 11.3 % — ABNORMAL LOW (ref 11.6–15.4)
WBC: 6.6 10*3/uL (ref 3.4–10.8)

## 2021-09-15 LAB — T-HELPER CELLS (CD4) COUNT (NOT AT ARMC)

## 2021-09-18 ENCOUNTER — Other Ambulatory Visit: Payer: Self-pay | Admitting: Infectious Disease

## 2021-09-18 DIAGNOSIS — B20 Human immunodeficiency virus [HIV] disease: Secondary | ICD-10-CM

## 2021-09-20 ENCOUNTER — Telehealth: Payer: Self-pay

## 2021-09-20 NOTE — Telephone Encounter (Signed)
Left voicemail asking patient to return my call.   Kase Shughart P Benigna Delisi, CMA  

## 2021-09-20 NOTE — Telephone Encounter (Signed)
-----   Message from Truman Hayward, MD sent at 09/18/2021 10:44 AM EDT ----- ?Chocolate does not appear to have been taking her meds as it religiously as she had in the past can we add a genotype including integrase to her labs that were drawn ?----- Message ----- ?From: Interface, [facility] Lab Results In ?Sent: 09/13/2021  10:44 PM EDT ?To: Truman Hayward, MD ? ? ?

## 2021-09-21 ENCOUNTER — Other Ambulatory Visit: Payer: Self-pay

## 2021-09-21 ENCOUNTER — Other Ambulatory Visit: Payer: Medicaid Other

## 2021-09-21 DIAGNOSIS — B20 Human immunodeficiency virus [HIV] disease: Secondary | ICD-10-CM

## 2021-09-21 DIAGNOSIS — R748 Abnormal levels of other serum enzymes: Secondary | ICD-10-CM

## 2021-09-21 NOTE — Telephone Encounter (Signed)
Orders for viral load and acute hepatitis panel placed per Dr. Tommy Medal.  ? ?Beryle Flock, RN ? ?

## 2021-09-21 NOTE — Telephone Encounter (Signed)
Spoke with patient and discussed elevated viral load. She reports issues getting medication from the pharmacy due to unstable housing and moving around a lot. Asked her to please call the office if she ever has problems getting her medication so that we can assist her. Advised her that genotype was ordered to make sure he medicine is still working for her. Patient verbalized understanding and has no further questions.  ? ?Beryle Flock, RN ? ?

## 2021-09-23 LAB — HIV-1 RNA QUANT-NO REFLEX-BLD
HIV 1 RNA Quant: 22800 copies/mL — ABNORMAL HIGH
HIV-1 RNA Quant, Log: 4.36 Log copies/mL — ABNORMAL HIGH

## 2021-09-23 LAB — HEPATITIS PANEL, ACUTE
Hep A IgM: NONREACTIVE
Hep B C IgM: NONREACTIVE
Hepatitis B Surface Ag: NONREACTIVE
Hepatitis C Ab: NONREACTIVE
SIGNAL TO CUT-OFF: 0.21 (ref <1.00)

## 2021-09-27 ENCOUNTER — Telehealth: Payer: Self-pay

## 2021-09-27 NOTE — Telephone Encounter (Signed)
Attempted to contact patient today for sooner appointment. Dr. Tommy Medal is worried patient may become resistant to medication due to most recent labs.  ? ?Unable to reach by phone today. Will route to triage to attempt to reach out again for sooner appt.  ?Stephen Griffin ? ?

## 2021-09-27 NOTE — Telephone Encounter (Signed)
-----   Message from Truman Hayward, MD sent at 09/26/2021  3:41 PM EDT ----- ?Regarding: FW: ?Need to bring Chocolate back in and understand why she is not takin her meds I?m worried she is going to get even more R ?----- Message ----- ?From: Interface, [facility] Lab Results In ?Sent: 09/13/2021  10:44 PM EDT ?To: Truman Hayward, MD ? ?

## 2021-09-28 ENCOUNTER — Other Ambulatory Visit (HOSPITAL_COMMUNITY): Payer: Self-pay

## 2021-09-29 NOTE — Telephone Encounter (Signed)
Spoke with patient, advised her that Dr. Tommy Medal would like her to come in for an appointment to discuss medication adherence and concerns for resistance.  ? ?She accepts appointment for 5/8. She has Medicaid and will need to set up transportation through them. Provided her with phone number to do so and asked her to please let us know if she has any problems getting this set up.  ? ?Beryle Flock, RN ? ?

## 2021-09-30 ENCOUNTER — Other Ambulatory Visit (HOSPITAL_COMMUNITY): Payer: Self-pay

## 2021-10-01 ENCOUNTER — Other Ambulatory Visit (HOSPITAL_COMMUNITY): Payer: Self-pay

## 2021-10-02 LAB — COMPLETE METABOLIC PANEL WITH GFR
AG Ratio: 1.2 (calc) (ref 1.0–2.5)
ALT: 71 U/L — ABNORMAL HIGH (ref 9–46)
AST: 86 U/L — ABNORMAL HIGH (ref 10–40)
Albumin: 4.2 g/dL (ref 3.6–5.1)
Alkaline phosphatase (APISO): 96 U/L (ref 36–130)
BUN: 11 mg/dL (ref 7–25)
CO2: 28 mmol/L (ref 20–32)
Calcium: 10.1 mg/dL (ref 8.6–10.3)
Chloride: 107 mmol/L (ref 98–110)
Creat: 1.07 mg/dL (ref 0.60–1.29)
Globulin: 3.4 g/dL (calc) (ref 1.9–3.7)
Glucose, Bld: 71 mg/dL (ref 65–99)
Potassium: 4.2 mmol/L (ref 3.5–5.3)
Sodium: 144 mmol/L (ref 135–146)
Total Bilirubin: 0.3 mg/dL (ref 0.2–1.2)
Total Protein: 7.6 g/dL (ref 6.1–8.1)
eGFR: 89 mL/min/{1.73_m2} (ref >=60)

## 2021-10-02 LAB — HIV-1 INTEGRASE GENOTYPE

## 2021-10-02 LAB — CBC WITH DIFFERENTIAL/PLATELET
Absolute Monocytes: 752 cells/uL (ref 200–950)
Basophils Absolute: 62 cells/uL (ref 0–200)
Basophils Relative: 0.9 %
Eosinophils Absolute: 152 cells/uL (ref 15–500)
Eosinophils Relative: 2.2 %
HCT: 46.3 % (ref 38.5–50.0)
Hemoglobin: 15.5 g/dL (ref 13.2–17.1)
Lymphs Abs: 2325 cells/uL (ref 850–3900)
MCH: 32.4 pg (ref 27.0–33.0)
MCHC: 33.5 g/dL (ref 32.0–36.0)
MCV: 96.9 fL (ref 80.0–100.0)
MPV: 9.8 fL (ref 7.5–12.5)
Monocytes Relative: 10.9 %
Neutro Abs: 3609 cells/uL (ref 1500–7800)
Neutrophils Relative %: 52.3 %
Platelets: 398 10*3/uL (ref 140–400)
RBC: 4.78 10*6/uL (ref 4.20–5.80)
RDW: 11.4 % (ref 11.0–15.0)
Total Lymphocyte: 33.7 %
WBC: 6.9 10*3/uL (ref 3.8–10.8)

## 2021-10-02 LAB — HIV RNA, RTPCR W/R GT (RTI, PI,INT)
HIV 1 RNA Quant: 17000 copies/mL — ABNORMAL HIGH
HIV-1 RNA Quant, Log: 4.23 Log copies/mL — ABNORMAL HIGH

## 2021-10-02 LAB — HIV-1 GENOTYPE: HIV-1 Genotype: DETECTED — AB

## 2021-10-02 LAB — LIPASE: Lipase: 28 U/L (ref 7–60)

## 2021-10-04 ENCOUNTER — Encounter: Payer: Self-pay | Admitting: Infectious Disease

## 2021-10-04 ENCOUNTER — Other Ambulatory Visit: Payer: Self-pay

## 2021-10-04 ENCOUNTER — Ambulatory Visit (INDEPENDENT_AMBULATORY_CARE_PROVIDER_SITE_OTHER): Payer: Medicaid Other | Admitting: Infectious Disease

## 2021-10-04 ENCOUNTER — Other Ambulatory Visit (HOSPITAL_COMMUNITY): Payer: Self-pay

## 2021-10-04 VITALS — BP 142/85 | HR 90 | Temp 98.2°F | Wt 162.0 lb

## 2021-10-04 DIAGNOSIS — H35033 Hypertensive retinopathy, bilateral: Secondary | ICD-10-CM | POA: Diagnosis not present

## 2021-10-04 DIAGNOSIS — B20 Human immunodeficiency virus [HIV] disease: Secondary | ICD-10-CM

## 2021-10-04 DIAGNOSIS — I10 Essential (primary) hypertension: Secondary | ICD-10-CM | POA: Diagnosis not present

## 2021-10-04 DIAGNOSIS — Z789 Other specified health status: Secondary | ICD-10-CM | POA: Diagnosis not present

## 2021-10-04 MED ORDER — DOXYCYCLINE HYCLATE 100 MG PO TABS
ORAL_TABLET | ORAL | 6 refills | Status: DC
  Filled 2021-10-04: qty 60, 30d supply, fill #0
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
## 2021-10-07 ENCOUNTER — Other Ambulatory Visit (HOSPITAL_COMMUNITY): Payer: Self-pay

## 2021-10-11 ENCOUNTER — Other Ambulatory Visit (HOSPITAL_COMMUNITY): Payer: Self-pay

## 2021-10-12 ENCOUNTER — Other Ambulatory Visit (HOSPITAL_COMMUNITY): Payer: Self-pay

## 2021-10-13 ENCOUNTER — Other Ambulatory Visit: Payer: Self-pay

## 2021-10-13 ENCOUNTER — Ambulatory Visit: Payer: Medicaid Other | Admitting: Infectious Disease

## 2021-10-14 LAB — HIV-1 INTEGRASE GENOTYPE

## 2021-10-14 LAB — HIV RNA, RTPCR W/R GT (RTI, PI,INT)
HIV 1 RNA Quant: 2300 copies/mL — ABNORMAL HIGH
HIV-1 RNA Quant, Log: 3.36 Log copies/mL — ABNORMAL HIGH

## 2021-10-14 LAB — HIV-1 GENOTYPE: HIV-1 Genotype: DETECTED — AB

## 2021-10-27 ENCOUNTER — Other Ambulatory Visit (HOSPITAL_COMMUNITY): Payer: Self-pay

## 2021-10-28 ENCOUNTER — Other Ambulatory Visit (HOSPITAL_COMMUNITY): Payer: Self-pay

## 2021-11-01 ENCOUNTER — Ambulatory Visit: Payer: Medicaid Other | Admitting: Infectious Disease

## 2021-11-02 ENCOUNTER — Other Ambulatory Visit (HOSPITAL_COMMUNITY): Payer: Self-pay

## 2021-11-08 ENCOUNTER — Ambulatory Visit: Payer: Medicaid Other | Admitting: Pharmacist

## 2021-11-08 ENCOUNTER — Telehealth: Payer: Self-pay | Admitting: Pharmacist

## 2021-11-08 NOTE — Telephone Encounter (Addendum)
Cumulative HIV Genotype Data  Genotype Dates: 10/04/21  RT Mutations  K65KR, D67GS, K219E, K103N, Y181C, K238T, K101QE  PI Mutations    Integrase Mutations     Interpretation of Genotype Data per [employer] HIV Drug Resistance Database:  Nucleoside RTIs  Abacavir - intermediate resistance Zidovudine - low level resistance Emtricitabine - intermediate resistance  Lamivudine - intermediate resistance  Tenofovir - high level resistance   Non-Nucleoside RTIs  Doravirine - intermediate resistance Efavirenz - high level resistance Etravirine - intermediate resistance Nevirapine - high level resistance Rilpivirine - high level resistance   Protease Inhibitors  Atazanavir - susceptible  Darunavir - susceptible  Lopinavir - susceptible    Integrase Inhibitors  Bictegravir - susceptible  Cabotegravir - susceptible  Dolutegravir - susceptible  Elvitegravir - susceptible  Raltegravir - susceptible    Sayana Salley L. Ruven Corradi, PharmD, BCIDP, AAHIVP, CPP Clinical Pharmacist Practitioner Infectious Diseases [place] [facility] 11/08/2021, 12:35 PM

## 2021-11-15 ENCOUNTER — Other Ambulatory Visit: Payer: Self-pay

## 2021-11-15 ENCOUNTER — Ambulatory Visit (INDEPENDENT_AMBULATORY_CARE_PROVIDER_SITE_OTHER): Payer: Medicaid Other | Admitting: Pharmacist

## 2021-11-15 ENCOUNTER — Other Ambulatory Visit (HOSPITAL_COMMUNITY)
Admission: RE | Admit: 2021-11-15 | Discharge: 2021-11-15 | Disposition: A | Discharge status: Home / Self Care | Payer: Medicaid Other | Source: Ambulatory Visit | Attending: Infectious Disease | Admitting: Infectious Disease

## 2021-11-15 DIAGNOSIS — Z113 Encounter for screening for infections with a predominantly sexual mode of transmission: Secondary | ICD-10-CM

## 2021-11-15 DIAGNOSIS — B20 Human immunodeficiency virus [HIV] disease: Secondary | ICD-10-CM

## 2021-11-15 DIAGNOSIS — Z7251 High risk heterosexual behavior: Secondary | ICD-10-CM
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
## 2021-11-16 LAB — URINE CYTOLOGY ANCILLARY ONLY
Chlamydia: NEGATIVE
Comment: NEGATIVE
Comment: NORMAL
Neisseria Gonorrhea: NEGATIVE

## 2021-11-16 LAB — T-HELPER CELLS (CD4) COUNT (NOT AT ARMC)
CD4 % Helper T Cell: 24 % — ABNORMAL LOW (ref 33–65)
CD4 T Cell Abs: 461 /uL (ref 400–1790)

## 2021-11-17 LAB — RPR: RPR Ser Ql: REACTIVE — AB

## 2021-11-17 LAB — RPR TITER: RPR Titer: 1:16 {titer} — ABNORMAL HIGH

## 2021-11-17 LAB — HIV-1 RNA QUANT-NO REFLEX-BLD
HIV 1 RNA Quant: <20 Copies/mL — ABNORMAL HIGH
HIV-1 RNA Quant, Log: <1.3 Log cps/mL — ABNORMAL HIGH

## 2021-11-17 LAB — FLUORESCENT TREPONEMAL AB(FTA)-IGG-BLD: Fluorescent Treponemal ABS: REACTIVE — AB

## 2021-11-18 ENCOUNTER — Telehealth: Payer: Self-pay | Admitting: Pharmacist

## 2021-11-18 ENCOUNTER — Encounter: Payer: Self-pay | Admitting: Pharmacist

## 2021-11-18 NOTE — Telephone Encounter (Signed)
Patient scheduled to see [facility] tomorrow

## 2021-11-18 NOTE — Telephone Encounter (Signed)
Thank you :)

## 2021-11-18 NOTE — Telephone Encounter (Signed)
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 Called to discuss and have her come in for treatment. No answer. Left HIPAA compliant VM to call me back. Will also send a MyChart message.   Antion Andres L. Hilarie Sinha, PharmD [facility] Clinical Pharmacist Practitioner

## 2021-11-19 ENCOUNTER — Ambulatory Visit (INDEPENDENT_AMBULATORY_CARE_PROVIDER_SITE_OTHER): Payer: Medicaid Other

## 2021-11-19 ENCOUNTER — Other Ambulatory Visit: Payer: Self-pay

## 2021-11-19 DIAGNOSIS — A5149 Other secondary syphilitic conditions: Secondary | ICD-10-CM

## 2021-11-19 MED ORDER — PENICILLIN G BENZATHINE 1200000 UNIT/2ML IM SUSY
1.2000 10*6.[IU] | PREFILLED_SYRINGE | Freq: Once | INTRAMUSCULAR | Status: AC
  Administered 2021-11-19: 1.2 10*6.[IU] via INTRAMUSCULAR

## 2021-11-23 ENCOUNTER — Other Ambulatory Visit (HOSPITAL_COMMUNITY): Payer: Self-pay

## 2021-11-29 ENCOUNTER — Ambulatory Visit: Payer: Medicaid Other | Admitting: Pharmacist

## 2021-12-06 ENCOUNTER — Telehealth: Payer: Self-pay

## 2021-12-06 NOTE — Telephone Encounter (Signed)
Received call from [place] at [facility] Dept. Provided information per request regarding patient's recent RPR titer on 11/15/21 and treatment administered on 11/19/21. All questions answered.  Binnie Kand, RN

## 2021-12-20 ENCOUNTER — Other Ambulatory Visit (HOSPITAL_COMMUNITY): Payer: Self-pay

## 2021-12-22 ENCOUNTER — Other Ambulatory Visit (HOSPITAL_COMMUNITY): Payer: Self-pay

## 2021-12-22 ENCOUNTER — Telehealth: Payer: Self-pay

## 2021-12-22 NOTE — Telephone Encounter (Signed)
Received medical clearance request from Dr. [name] for tooth extraction etc.   Form placed in provider's box for review.   Beryle Flock, RN

## 2021-12-27 ENCOUNTER — Other Ambulatory Visit (HOSPITAL_COMMUNITY): Payer: Self-pay

## 2021-12-27 NOTE — Telephone Encounter (Signed)
Form completed by provider and faxed to Dr. Royce Macadamia office.   Beryle Flock, RN

## 2021-12-28 ENCOUNTER — Other Ambulatory Visit (HOSPITAL_COMMUNITY): Payer: Self-pay

## 2022-01-17 ENCOUNTER — Ambulatory Visit: Payer: Medicaid Other | Admitting: Infectious Disease

## 2022-01-18 ENCOUNTER — Other Ambulatory Visit (HOSPITAL_COMMUNITY): Payer: Self-pay

## 2022-01-20 ENCOUNTER — Other Ambulatory Visit (HOSPITAL_COMMUNITY): Payer: Self-pay

## 2022-02-08 ENCOUNTER — Ambulatory Visit: Payer: Medicaid Other | Admitting: Infectious Disease

## 2022-02-08 ENCOUNTER — Telehealth: Payer: Self-pay

## 2022-02-08 NOTE — Telephone Encounter (Signed)
Attempted to call patient regarding missed appointment today with Dr Tommy Medal. Not able to reach her at this time. Voicemail not set up. Will need to reschedule.  Leatrice Jewels, RMA

## 2022-02-15 ENCOUNTER — Other Ambulatory Visit (HOSPITAL_COMMUNITY): Payer: Self-pay

## 2022-02-16 ENCOUNTER — Other Ambulatory Visit (HOSPITAL_COMMUNITY)
Admission: RE | Admit: 2022-02-16 | Discharge: 2022-02-16 | Disposition: A | Discharge status: Home / Self Care | Payer: Medicaid Other | Source: Ambulatory Visit | Attending: Infectious Disease | Admitting: Infectious Disease

## 2022-02-16 ENCOUNTER — Ambulatory Visit (INDEPENDENT_AMBULATORY_CARE_PROVIDER_SITE_OTHER): Payer: Medicaid Other | Admitting: Infectious Disease

## 2022-02-16 ENCOUNTER — Other Ambulatory Visit (HOSPITAL_COMMUNITY): Payer: Self-pay

## 2022-02-16 ENCOUNTER — Encounter: Payer: Self-pay | Admitting: Infectious Disease

## 2022-02-16 ENCOUNTER — Ambulatory Visit
Admission: RE | Admit: 2022-02-16 | Discharge: 2022-02-16 | Disposition: A | Discharge status: Home / Self Care | Payer: Medicaid Other | Source: Ambulatory Visit | Attending: Infectious Disease | Admitting: Infectious Disease

## 2022-02-16 ENCOUNTER — Other Ambulatory Visit: Payer: Self-pay

## 2022-02-16 VITALS — BP 136/92 | HR 83 | Temp 97.9°F | Ht 71.0 in | Wt 163.0 lb

## 2022-02-16 DIAGNOSIS — M79671 Pain in right foot: Secondary | ICD-10-CM | POA: Diagnosis not present

## 2022-02-16 DIAGNOSIS — E782 Mixed hyperlipidemia: Secondary | ICD-10-CM

## 2022-02-16 DIAGNOSIS — H35033 Hypertensive retinopathy, bilateral: Secondary | ICD-10-CM | POA: Diagnosis not present

## 2022-02-16 DIAGNOSIS — Z113 Encounter for screening for infections with a predominantly sexual mode of transmission: Secondary | ICD-10-CM

## 2022-02-16 DIAGNOSIS — B20 Human immunodeficiency virus [HIV] disease: Secondary | ICD-10-CM | POA: Insufficient documentation

## 2022-02-16 DIAGNOSIS — Z789 Other specified health status: Secondary | ICD-10-CM

## 2022-02-16 DIAGNOSIS — M7989 Other specified soft tissue disorders: Secondary | ICD-10-CM | POA: Diagnosis not present

## 2022-02-16 DIAGNOSIS — I10 Essential (primary) hypertension: Secondary | ICD-10-CM | POA: Diagnosis not present

## 2022-02-16 DIAGNOSIS — E785 Hyperlipidemia, unspecified: Secondary | ICD-10-CM | POA: Insufficient documentation

## 2022-02-16 HISTORY — DX: Pain in right foot: M79.671

## 2022-02-16 HISTORY — DX: Hyperlipidemia, unspecified: E78.5

## 2022-02-16 MED ORDER — DOLUTEGRAVIR SODIUM 50 MG PO TABS
50.0000 mg | ORAL_TABLET | Freq: Every day | ORAL | 11 refills | Status: DC
  Filled 2022-02-16 – 2022-02-18 (×2): qty 30, 30d supply, fill #0
  Filled 2022-03-16: qty 30, 30d supply, fill #1
  Filled 2022-04-05: qty 30, 30d supply, fill #2
  Filled 2022-05-10 – 2022-05-11 (×2): qty 30, 30d supply, fill #3
  Filled 2022-06-09 (×2): qty 30, 30d supply, fill #4
  Filled 2022-07-07: qty 30, 30d supply, fill #5
  Filled 2022-08-02: qty 30, 30d supply, fill #6
  Filled 2022-08-25: qty 30, 30d supply, fill #7
  Filled 2022-09-27: qty 30, 30d supply, fill #8
  Filled 2022-10-25: qty 30, 30d supply, fill #9
  Filled 2022-11-28: qty 30, 30d supply, fill #10
  Filled 2023-01-03: qty 30, 30d supply, fill #11

## 2022-02-16 MED ORDER — ESTRADIOL VALERATE 20 MG/ML IM OIL
TOPICAL_OIL | INTRAMUSCULAR | 6 refills | Status: DC
  Filled 2022-02-16: qty 1, 28d supply, fill #0
  Filled 2022-02-18: qty 5, 28d supply, fill #0
  Filled 2022-06-09: qty 5, 28d supply, fill #1
  Filled 2022-07-07: qty 5, 28d supply, fill #2
  Filled 2022-08-02: qty 5, 28d supply, fill #3
  Filled 2022-08-25: qty 5, 28d supply, fill #4
  Filled 2022-09-27: qty 5, 28d supply, fill #5
  Filled 2022-10-25: qty 5, 28d supply, fill #6

## 2022-02-16 MED ORDER — ROSUVASTATIN CALCIUM 20 MG PO TABS
20.0000 mg | ORAL_TABLET | Freq: Every day | ORAL | 11 refills | Status: DC
  Filled 2022-02-16 – 2022-02-18 (×2): qty 30, 30d supply, fill #0

## 2022-02-16 MED ORDER — SPIRONOLACTONE 50 MG PO TABS
50.0000 mg | ORAL_TABLET | Freq: Every day | ORAL | 11 refills | Status: DC
  Filled 2022-02-16 – 2022-02-18 (×2): qty 30, 30d supply, fill #0
  Filled 2022-06-09: qty 30, 30d supply, fill #1
  Filled 2022-07-07: qty 30, 30d supply, fill #2
  Filled 2022-08-02: qty 30, 30d supply, fill #3
  Filled 2022-08-25: qty 30, 30d supply, fill #4
  Filled 2022-09-27: qty 30, 30d supply, fill #5
  Filled 2022-10-25: qty 30, 30d supply, fill #6
  Filled 2023-02-15: qty 30, 30d supply, fill #7

## 2022-02-16 MED ORDER — DOXYCYCLINE HYCLATE 100 MG PO TABS
ORAL_TABLET | ORAL | 6 refills | Status: DC
  Filled 2022-02-16 – 2022-02-18 (×2): qty 60, 30d supply, fill #0
  Filled 2023-02-15: qty 60, 30d supply, fill #1

## 2022-02-16 MED ORDER — SYMTUZA 800-150-200-10 MG PO TABS
1.0000 | ORAL_TABLET | Freq: Every day | ORAL | 11 refills | Status: DC
  Filled 2022-02-16 – 2022-02-18 (×2): qty 30, 30d supply, fill #0
  Filled 2022-03-16: qty 30, 30d supply, fill #1
  Filled 2022-04-05: qty 30, 30d supply, fill #2
  Filled 2022-05-10 – 2022-05-11 (×2): qty 30, 30d supply, fill #3
  Filled 2022-06-09 (×2): qty 30, 30d supply, fill #4
  Filled 2022-07-07: qty 30, 30d supply, fill #5
  Filled 2022-08-02: qty 30, 30d supply, fill #6
  Filled 2022-08-25: qty 30, 30d supply, fill #7
  Filled 2022-09-27: qty 30, 30d supply, fill #8
  Filled 2022-10-25: qty 30, 30d supply, fill #9
  Filled 2022-11-28: qty 30, 30d supply, fill #10
  Filled 2023-01-03: qty 30, 30d supply, fill #11

## 2022-02-16 MED ORDER — SPIRONOLACTONE 100 MG PO TABS
ORAL_TABLET | Freq: Every day | ORAL | 11 refills | Status: DC
  Filled 2022-02-16: qty 30, fill #0
  Filled 2022-02-18: qty 30, 30d supply, fill #0
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
## 2022-02-17 ENCOUNTER — Other Ambulatory Visit: Payer: Self-pay | Admitting: Infectious Disease

## 2022-02-17 ENCOUNTER — Telehealth: Payer: Self-pay

## 2022-02-17 ENCOUNTER — Telehealth: Payer: Self-pay | Admitting: Infectious Disease

## 2022-02-17 DIAGNOSIS — E162 Hypoglycemia, unspecified: Secondary | ICD-10-CM

## 2022-02-17 LAB — CYTOLOGY, (ORAL, ANAL, URETHRAL) ANCILLARY ONLY
Chlamydia: NEGATIVE
Chlamydia: NEGATIVE
Comment: NEGATIVE
Comment: NEGATIVE
Comment: NORMAL
Comment: NORMAL
Neisseria Gonorrhea: NEGATIVE
Neisseria Gonorrhea: NEGATIVE

## 2022-02-17 LAB — URINE CYTOLOGY ANCILLARY ONLY
Chlamydia: NEGATIVE
Comment: NEGATIVE
Comment: NORMAL
Neisseria Gonorrhea: NEGATIVE

## 2022-02-17 LAB — T-HELPER CELLS (CD4) COUNT (NOT AT ARMC)
CD4 % Helper T Cell: 27 % — ABNORMAL LOW (ref 33–65)
CD4 T Cell Abs: 498 /uL (ref 400–1790)

## 2022-02-17 NOTE — Telephone Encounter (Signed)
-----   Message from Truman Hayward, MD sent at 02/17/2022  8:45 AM EDT ----- Stephen Griffin is in the 47s. Can we check on her. I probably also want her to see endocrine for work up of this.  ----- Message ----- From: Buel Ream, [facility] Lab Results In Sent: 02/16/2022   2:56 PM EDT To: Truman Hayward, MD

## 2022-02-17 NOTE — Telephone Encounter (Signed)
I put in referral to endocrine  Can we have Chocolate come back for repeat CMP tomorrow with insulin level, c peptide?  I have also referred to Endocrinology. I also wonder if our lab did not process ehr serum correctly and this is a lab error

## 2022-02-17 NOTE — Telephone Encounter (Signed)
Attempted to call patient regarding elevate glucose level. Not able to reach her at either number listed in her chart. Will need to have patient follow up with Endocrinologist for work up. Leatrice Jewels, RMA

## 2022-02-17 NOTE — Telephone Encounter (Signed)
Was able to connect with patient regarding labs. Did not have any questions at this time. Will come to office to repeat labs. Leatrice Jewels, RMA

## 2022-02-17 NOTE — Telephone Encounter (Signed)
Was able to connect with patient regarding lab. Is scheduled for tomorrow at 8:45. Does not have any questions at this time. Is aware of referral for Endocrinology. Leatrice Jewels, RMA

## 2022-02-18 ENCOUNTER — Telehealth: Payer: Self-pay

## 2022-02-18 ENCOUNTER — Other Ambulatory Visit: Payer: Self-pay | Admitting: Infectious Disease

## 2022-02-18 ENCOUNTER — Other Ambulatory Visit (HOSPITAL_COMMUNITY): Payer: Self-pay

## 2022-02-18 ENCOUNTER — Other Ambulatory Visit: Payer: Medicaid Other

## 2022-02-18 DIAGNOSIS — S92331A Displaced fracture of third metatarsal bone, right foot, initial encounter for closed fracture: Secondary | ICD-10-CM

## 2022-02-18 NOTE — Telephone Encounter (Signed)
-----   Message from Truman Hayward, MD sent at 02/18/2022 10:40 AM EDT ----- Stephen Griffin has a few fractures in bones of her foot that are minimally displaced. I put in referral to orthopedics. She can take two ibuprofens with one tylenol every 6 hours in the mean time ----- Message ----- From: Interface, Rad Results In Sent: 02/17/2022   9:45 PM EDT To: Truman Hayward, MD

## 2022-02-18 NOTE — Telephone Encounter (Signed)
Spoke with Chocolate, relayed that xray showed some fractures in her foot and that Dr. Tommy Medal has placed a referral for her to see ortho. Relayed that Dr. Tommy Medal recommends two ibuprofen with one tylenol every 6 hours in the meantime for pain control. Patient verbalized understanding and has no further questions.   Beryle Flock, RN

## 2022-02-21 ENCOUNTER — Other Ambulatory Visit (HOSPITAL_COMMUNITY): Payer: Self-pay

## 2022-02-22 ENCOUNTER — Other Ambulatory Visit (HOSPITAL_COMMUNITY): Payer: Self-pay

## 2022-02-22 LAB — CBC WITH DIFFERENTIAL/PLATELET
Absolute Monocytes: 770 cells/uL (ref 200–950)
Basophils Absolute: 50 cells/uL (ref 0–200)
Basophils Relative: 0.7 %
Eosinophils Absolute: 72 cells/uL (ref 15–500)
Eosinophils Relative: 1 %
HCT: 46.7 % (ref 38.5–50.0)
Hemoglobin: 15.8 g/dL (ref 13.2–17.1)
Lymphs Abs: 2059 cells/uL (ref 850–3900)
MCH: 32.3 pg (ref 27.0–33.0)
MCHC: 33.8 g/dL (ref 32.0–36.0)
MCV: 95.5 fL (ref 80.0–100.0)
MPV: 9.6 fL (ref 7.5–12.5)
Monocytes Relative: 10.7 %
Neutro Abs: 4248 cells/uL (ref 1500–7800)
Neutrophils Relative %: 59 %
Platelets: 390 10*3/uL (ref 140–400)
RBC: 4.89 10*6/uL (ref 4.20–5.80)
RDW: 11.8 % (ref 11.0–15.0)
Total Lymphocyte: 28.6 %
WBC: 7.2 10*3/uL (ref 3.8–10.8)

## 2022-02-22 LAB — LIPID PANEL
Cholesterol: 149 mg/dL (ref <200)
HDL: 46 mg/dL (ref >=40)
LDL Cholesterol (Calc): 75 mg/dL (calc)
Non-HDL Cholesterol (Calc): 103 mg/dL (calc) (ref <130)
Total CHOL/HDL Ratio: 3.2 (calc) (ref <5.0)
Triglycerides: 188 mg/dL — ABNORMAL HIGH (ref <150)

## 2022-02-22 LAB — COMPLETE METABOLIC PANEL WITH GFR
AG Ratio: 1.3 (calc) (ref 1.0–2.5)
ALT: 22 U/L (ref 9–46)
AST: 22 U/L (ref 10–40)
Albumin: 4.4 g/dL (ref 3.6–5.1)
Alkaline phosphatase (APISO): 93 U/L (ref 36–130)
BUN: 11 mg/dL (ref 7–25)
CO2: 27 mmol/L (ref 20–32)
Calcium: 9.8 mg/dL (ref 8.6–10.3)
Chloride: 109 mmol/L (ref 98–110)
Creat: 1.02 mg/dL (ref 0.60–1.29)
Globulin: 3.5 g/dL (calc) (ref 1.9–3.7)
Glucose, Bld: 37 mg/dL — CL (ref 65–99)
Potassium: 3.8 mmol/L (ref 3.5–5.3)
Sodium: 144 mmol/L (ref 135–146)
Total Bilirubin: 0.4 mg/dL (ref 0.2–1.2)
Total Protein: 7.9 g/dL (ref 6.1–8.1)
eGFR: 95 mL/min/{1.73_m2} (ref >=60)

## 2022-02-22 LAB — RPR TITER: RPR Titer: 1:4 {titer} — ABNORMAL HIGH

## 2022-02-22 LAB — RPR: RPR Ser Ql: REACTIVE — AB

## 2022-02-22 LAB — FLUORESCENT TREPONEMAL AB(FTA)-IGG-BLD: Fluorescent Treponemal ABS: REACTIVE — AB

## 2022-02-22 LAB — HIV RNA, RTPCR W/R GT (RTI, PI,INT)
HIV 1 RNA Quant: <20 copies/mL — AB
HIV-1 RNA Quant, Log: <1.3 Log copies/mL — AB

## 2022-02-22 LAB — TESTOSTERONE: Testosterone: 759 ng/dL (ref 250–827)

## 2022-02-23 ENCOUNTER — Other Ambulatory Visit (HOSPITAL_COMMUNITY): Payer: Self-pay

## 2022-02-23 ENCOUNTER — Other Ambulatory Visit: Payer: Medicaid Other

## 2022-02-25 ENCOUNTER — Other Ambulatory Visit: Payer: Medicaid Other

## 2022-02-28 ENCOUNTER — Other Ambulatory Visit: Payer: Self-pay | Admitting: Infectious Disease

## 2022-02-28 DIAGNOSIS — S92901A Unspecified fracture of right foot, initial encounter for closed fracture: Secondary | ICD-10-CM

## 2022-03-07 ENCOUNTER — Other Ambulatory Visit: Payer: Self-pay | Admitting: Infectious Disease

## 2022-03-07 DIAGNOSIS — Z789 Other specified health status: Secondary | ICD-10-CM

## 2022-03-07 DIAGNOSIS — E162 Hypoglycemia, unspecified: Secondary | ICD-10-CM

## 2022-03-10 ENCOUNTER — Other Ambulatory Visit: Payer: Self-pay

## 2022-03-10 ENCOUNTER — Encounter (HOSPITAL_COMMUNITY): Payer: Self-pay | Admitting: Oral Surgery

## 2022-03-10 NOTE — Progress Notes (Signed)
PCP - denies Cardiologist - denies Infectious Disease - Tommy Medal, [place]  PPM/ICD - denies  Chest x-ray - n/a EKG - day of surgery  CPAP - n/a  Fasting Blood Sugar - n/a  Blood Thinner Instructions: n/a Aspirin Instructions: Patient was instructed: As of today, STOP taking any Aspirin (unless otherwise instructed by your surgeon) Aleve, Naproxen, Ibuprofen, Motrin, Advil, Goody's, BC's, all herbal medications, fish oil, and all vitamins.  ERAS Protcol - n/a  COVID TEST- n/a  Anesthesia review: yes - history of DVT; medical clearance   Patient verbally denies any shortness of breath, fever, cough and chest pain during phone call   -------------  SDW INSTRUCTIONS given:  Your procedure is scheduled on Friday, October 13th, 2023.  Report to [facility] Main Entrance "A" at [place] and check in at the Admitting office.  Call this number if you have problems the morning of surgery:  [phone]   Remember:  Do not eat or drink after midnight the night before your surgery    Take these medicines the morning of surgery with A SIP OF WATER Darunavir-Cobicistat-Emtricitabine-Tenofovir Alafenamide (SYMTUZA) dolutegravir (TIVICAY) Marinol, Crestor   The day of surgery:                     Do not wear jewelry, make up, or nail polish            Do not wear lotions, powders, perfumes, or deodorant.            Do not shave 48 hours prior to surgery.             Do not bring valuables to the hospital.            [facility] is not responsible for any belongings or valuables.  Do NOT Smoke (Tobacco/Vaping) 24 hours prior to your procedure If you use a CPAP at night, you may bring all equipment for your overnight stay.   Contacts, glasses, dentures or bridgework may not be worn into surgery.      For patients admitted to the hospital, discharge time will be determined by your treatment team.   Patients discharged the day of surgery will not be allowed to drive home, and  someone needs to stay with them for 24 hours.    Special instructions:   [place]- Preparing For Surgery  Before surgery, you can play an important role. Because skin is not sterile, your skin needs to be as free of germs as possible. You can reduce the number of germs on your skin by washing with CHG (chlorahexidine gluconate) Soap before surgery.  CHG is an antiseptic cleaner which kills germs and bonds with the skin to continue killing germs even after washing.    Oral Hygiene is also important to reduce your risk of infection.  Remember - BRUSH YOUR TEETH THE MORNING OF SURGERY WITH YOUR REGULAR TOOTHPASTE  Please do not use if you have an allergy to CHG or antibacterial soaps. If your skin becomes reddened/irritated stop using the CHG.  Do not shave (including legs and underarms) for at least 48 hours prior to first CHG shower. It is OK to shave your face.  Please follow these instructions carefully.   Shower the NIGHT BEFORE SURGERY and the MORNING OF SURGERY with DIAL Soap.   Pat yourself dry with a CLEAN TOWEL.  Wear CLEAN PAJAMAS to bed the night before surgery  Place CLEAN SHEETS on your bed the night of  your first shower and DO NOT SLEEP WITH PETS.   Day of Surgery: Please shower morning of surgery  Wear Clean/Comfortable clothing the morning of surgery Do not apply any deodorants/lotions.   Remember to brush your teeth WITH YOUR REGULAR TOOTHPASTE.   Questions were answered. Patient verbalized understanding of instructions.

## 2022-03-10 NOTE — Anesthesia Preprocedure Evaluation (Addendum)
Anesthesia Evaluation  Patient identified by MRN, date of birth, ID band Patient awake    Reviewed: Allergy & Precautions, H&P , NPO status , Patient's Chart, lab work & pertinent test results  Airway Mallampati: II   Neck ROM: full    Dental   Pulmonary asthma , Current Smoker and Patient abstained from smoking.,    breath sounds clear to auscultation       Cardiovascular hypertension,  Rhythm:regular Rate:Normal     Neuro/Psych  Headaches, PSYCHIATRIC DISORDERS Anxiety  Neuromuscular disease    GI/Hepatic   Endo/Other    Renal/GU      Musculoskeletal   Abdominal   Peds  Hematology  (+) HIV,   Anesthesia Other Findings   Reproductive/Obstetrics                            Anesthesia Physical Anesthesia Plan  ASA: 3  Anesthesia Plan: General   Post-op Pain Management:    Induction: Intravenous  PONV Risk Score and Plan: 1 and Ondansetron, Dexamethasone, Midazolam and Treatment may vary due to age or medical condition  Airway Management Planned: Oral ETT  Additional Equipment:   Intra-op Plan:   Post-operative Plan: Extubation in OR  Informed Consent: I have reviewed the patients History and Physical, chart, labs and discussed the procedure including the risks, benefits and alternatives for the proposed anesthesia with the patient or authorized representative who has indicated his/her understanding and acceptance.     Dental advisory given  Plan Discussed with: CRNA, Anesthesiologist and Surgeon  Anesthesia Plan Comments: (PAT note written 03/10/2022 by Myra Gianotti, PA-C. 41 year old transgender male with history smoking, HIV (diagnosed 10/2005), HTN, HLD, DVT (Left CFV DVT 09/04/06), asthma, hypertensive retinopathy. Glucose 37 with routine labs 02/16/22 with ID. CBG ordered for day of surgery.  )       Anesthesia Quick Evaluation
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
## 2022-03-11 ENCOUNTER — Encounter (HOSPITAL_COMMUNITY): Payer: Self-pay | Admitting: Emergency Medicine

## 2022-03-11 ENCOUNTER — Other Ambulatory Visit (HOSPITAL_COMMUNITY): Payer: Self-pay

## 2022-03-11 ENCOUNTER — Other Ambulatory Visit: Payer: Self-pay

## 2022-03-11 ENCOUNTER — Encounter (HOSPITAL_COMMUNITY): Payer: Self-pay | Admitting: Oral Surgery

## 2022-03-11 ENCOUNTER — Ambulatory Visit (HOSPITAL_COMMUNITY): Payer: Medicaid Other | Admitting: Vascular Surgery

## 2022-03-11 ENCOUNTER — Observation Stay (HOSPITAL_COMMUNITY)
Admission: EM | Admit: 2022-03-11 | Discharge: 2022-03-12 | Disposition: A | Discharge status: Home / Self Care | Payer: Medicaid Other | Attending: Internal Medicine | Admitting: Internal Medicine

## 2022-03-11 ENCOUNTER — Ambulatory Visit (HOSPITAL_BASED_OUTPATIENT_CLINIC_OR_DEPARTMENT_OTHER): Payer: Medicaid Other | Admitting: Vascular Surgery

## 2022-03-11 ENCOUNTER — Ambulatory Visit (HOSPITAL_COMMUNITY)
Admission: RE | Admit: 2022-03-11 | Discharge: 2022-03-11 | Disposition: A | Discharge status: Home / Self Care | Payer: Medicaid Other | Source: Home / Self Care | Attending: Oral Surgery | Admitting: Oral Surgery

## 2022-03-11 ENCOUNTER — Encounter (HOSPITAL_COMMUNITY)
Admission: RE | Disposition: A | Discharge status: Home / Self Care | Payer: Self-pay | Source: Home / Self Care | Attending: Oral Surgery

## 2022-03-11 ENCOUNTER — Emergency Department (HOSPITAL_COMMUNITY): Payer: Medicaid Other

## 2022-03-11 DIAGNOSIS — X58XXXA Exposure to other specified factors, initial encounter: Secondary | ICD-10-CM | POA: Diagnosis not present

## 2022-03-11 DIAGNOSIS — I10 Essential (primary) hypertension: Secondary | ICD-10-CM

## 2022-03-11 DIAGNOSIS — B20 Human immunodeficiency virus [HIV] disease: Secondary | ICD-10-CM | POA: Diagnosis not present

## 2022-03-11 DIAGNOSIS — M278 Other specified diseases of jaws: Secondary | ICD-10-CM | POA: Diagnosis not present

## 2022-03-11 DIAGNOSIS — R221 Localized swelling, mass and lump, neck: Secondary | ICD-10-CM | POA: Diagnosis present

## 2022-03-11 DIAGNOSIS — Z79890 Hormone replacement therapy (postmenopausal): Secondary | ICD-10-CM | POA: Insufficient documentation

## 2022-03-11 DIAGNOSIS — R9431 Abnormal electrocardiogram [ECG] [EKG]: Secondary | ICD-10-CM | POA: Diagnosis not present

## 2022-03-11 DIAGNOSIS — E785 Hyperlipidemia, unspecified: Secondary | ICD-10-CM | POA: Insufficient documentation

## 2022-03-11 DIAGNOSIS — Z21 Asymptomatic human immunodeficiency virus [HIV] infection status: Secondary | ICD-10-CM | POA: Insufficient documentation

## 2022-03-11 DIAGNOSIS — R1310 Dysphagia, unspecified: Secondary | ICD-10-CM | POA: Diagnosis not present

## 2022-03-11 DIAGNOSIS — K047 Periapical abscess without sinus: Principal | ICD-10-CM | POA: Insufficient documentation

## 2022-03-11 DIAGNOSIS — J45909 Unspecified asthma, uncomplicated: Secondary | ICD-10-CM | POA: Diagnosis not present

## 2022-03-11 DIAGNOSIS — Z9189 Other specified personal risk factors, not elsewhere classified: Secondary | ICD-10-CM

## 2022-03-11 DIAGNOSIS — K029 Dental caries, unspecified: Secondary | ICD-10-CM | POA: Insufficient documentation

## 2022-03-11 DIAGNOSIS — K0889 Other specified disorders of teeth and supporting structures: Secondary | ICD-10-CM | POA: Diagnosis not present

## 2022-03-11 DIAGNOSIS — F640 Transsexualism: Secondary | ICD-10-CM | POA: Diagnosis not present

## 2022-03-11 DIAGNOSIS — H35033 Hypertensive retinopathy, bilateral: Secondary | ICD-10-CM | POA: Insufficient documentation

## 2022-03-11 DIAGNOSIS — K0530 Chronic periodontitis, unspecified: Secondary | ICD-10-CM | POA: Diagnosis not present

## 2022-03-11 DIAGNOSIS — S00532A Contusion of oral cavity, initial encounter: Secondary | ICD-10-CM | POA: Insufficient documentation

## 2022-03-11 DIAGNOSIS — E162 Hypoglycemia, unspecified: Secondary | ICD-10-CM | POA: Diagnosis not present

## 2022-03-11 DIAGNOSIS — Z9889 Other specified postprocedural states: Secondary | ICD-10-CM | POA: Diagnosis not present

## 2022-03-11 DIAGNOSIS — Z86718 Personal history of other venous thrombosis and embolism: Secondary | ICD-10-CM | POA: Diagnosis not present

## 2022-03-11 DIAGNOSIS — Z79899 Other long term (current) drug therapy: Secondary | ICD-10-CM | POA: Insufficient documentation

## 2022-03-11 DIAGNOSIS — Z8249 Family history of ischemic heart disease and other diseases of the circulatory system: Secondary | ICD-10-CM

## 2022-03-11 DIAGNOSIS — R220 Localized swelling, mass and lump, head: Secondary | ICD-10-CM | POA: Diagnosis not present

## 2022-03-11 DIAGNOSIS — F17210 Nicotine dependence, cigarettes, uncomplicated: Secondary | ICD-10-CM | POA: Insufficient documentation

## 2022-03-11 DIAGNOSIS — M270 Developmental disorders of jaws: Secondary | ICD-10-CM | POA: Diagnosis not present

## 2022-03-11 HISTORY — PX: TOOTH EXTRACTION: SHX859

## 2022-03-11 LAB — I-STAT CHEM 8, ED
BUN: 11 mg/dL (ref 6–20)
Calcium, Ion: 1.14 mmol/L — ABNORMAL LOW (ref 1.15–1.40)
Chloride: 106 mmol/L (ref 98–111)
Creatinine, Ser: 1 mg/dL (ref 0.61–1.24)
Glucose, Bld: 142 mg/dL — ABNORMAL HIGH (ref 70–99)
HCT: 51 % (ref 39.0–52.0)
Hemoglobin: 17.3 g/dL — ABNORMAL HIGH (ref 13.0–17.0)
Potassium: 4.3 mmol/L (ref 3.5–5.1)
Sodium: 142 mmol/L (ref 135–145)
TCO2: 23 mmol/L (ref 22–32)

## 2022-03-11 LAB — CBC WITH DIFFERENTIAL/PLATELET
Abs Immature Granulocytes: 0.1 10*3/uL — ABNORMAL HIGH (ref 0.00–0.07)
Basophils Absolute: 0 10*3/uL (ref 0.0–0.1)
Basophils Relative: 0 %
Eosinophils Absolute: 0 10*3/uL (ref 0.0–0.5)
Eosinophils Relative: 0 %
HCT: 47.9 % (ref 39.0–52.0)
Hemoglobin: 15.5 g/dL (ref 13.0–17.0)
Immature Granulocytes: 1 %
Lymphocytes Relative: 5 %
Lymphs Abs: 0.9 10*3/uL (ref 0.7–4.0)
MCH: 32.5 pg (ref 26.0–34.0)
MCHC: 32.4 g/dL (ref 30.0–36.0)
MCV: 100.4 fL — ABNORMAL HIGH (ref 80.0–100.0)
Monocytes Absolute: 0.7 10*3/uL (ref 0.1–1.0)
Monocytes Relative: 4 %
Neutro Abs: 15.9 10*3/uL — ABNORMAL HIGH (ref 1.7–7.7)
Neutrophils Relative %: 90 %
Platelets: 363 10*3/uL (ref 150–400)
RBC: 4.77 MIL/uL (ref 4.22–5.81)
RDW: 11.9 % (ref 11.5–15.5)
WBC: 17.7 10*3/uL — ABNORMAL HIGH (ref 4.0–10.5)
nRBC: 0 % (ref 0.0–0.2)

## 2022-03-11 LAB — BASIC METABOLIC PANEL
Anion gap: 9 (ref 5–15)
BUN: 10 mg/dL (ref 6–20)
CO2: 24 mmol/L (ref 22–32)
Calcium: 9.6 mg/dL (ref 8.9–10.3)
Chloride: 108 mmol/L (ref 98–111)
Creatinine, Ser: 1.15 mg/dL (ref 0.61–1.24)
GFR, Estimated: >60 mL/min (ref >60)
Glucose, Bld: 142 mg/dL — ABNORMAL HIGH (ref 70–99)
Potassium: 4.5 mmol/L (ref 3.5–5.1)
Sodium: 141 mmol/L (ref 135–145)

## 2022-03-11 LAB — PROTIME-INR
INR: 1.1 (ref 0.8–1.2)
Prothrombin Time: 14.2 seconds (ref 11.4–15.2)

## 2022-03-11 LAB — GLUCOSE, CAPILLARY: Glucose-Capillary: 99 mg/dL (ref 70–99)

## 2022-03-11 SURGERY — DENTAL RESTORATION/EXTRACTIONS
Anesthesia: General

## 2022-03-11 MED ORDER — LIDOCAINE-EPINEPHRINE 2 %-1:100000 IJ SOLN
INTRAMUSCULAR | Status: AC
  Filled 2022-03-11: qty 1

## 2022-03-11 MED ORDER — ONDANSETRON HCL 4 MG/2ML IJ SOLN: 4.0000 mg | Freq: Four times a day (QID) | INTRAMUSCULAR | Status: DC | PRN

## 2022-03-11 MED ORDER — ONDANSETRON HCL 4 MG/2ML IJ SOLN
INTRAMUSCULAR | Status: DC | PRN
  Administered 2022-03-11: 4 mg via INTRAVENOUS

## 2022-03-11 MED ORDER — FENTANYL CITRATE (PF) 100 MCG/2ML IJ SOLN
25.0000 ug | INTRAMUSCULAR | Status: DC | PRN
  Administered 2022-03-11: 50 ug via INTRAVENOUS

## 2022-03-11 MED ORDER — PROPOFOL 10 MG/ML IV BOLUS
INTRAVENOUS | Status: AC
  Filled 2022-03-11: qty 20

## 2022-03-11 MED ORDER — PHENYLEPHRINE HCL (PRESSORS) 10 MG/ML IV SOLN
INTRAVENOUS | Status: DC | PRN
  Administered 2022-03-11 (×3): 80 ug via INTRAVENOUS

## 2022-03-11 MED ORDER — POLYETHYLENE GLYCOL 3350 17 G PO PACK: 17.0000 g | PACK | Freq: Every day | ORAL | Status: DC | PRN

## 2022-03-11 MED ORDER — SODIUM CHLORIDE 0.9 % IR SOLN
Status: DC | PRN
  Administered 2022-03-11: 1000 mL

## 2022-03-11 MED ORDER — ROCURONIUM BROMIDE 10 MG/ML (PF) SYRINGE
PREFILLED_SYRINGE | INTRAVENOUS | Status: AC
  Filled 2022-03-11: qty 10

## 2022-03-11 MED ORDER — DOCUSATE SODIUM 100 MG PO CAPS: 100.0000 mg | ORAL_CAPSULE | Freq: Two times a day (BID) | ORAL | Status: DC | PRN

## 2022-03-11 MED ORDER — FENTANYL CITRATE (PF) 100 MCG/2ML IJ SOLN
INTRAMUSCULAR | Status: AC
  Filled 2022-03-11: qty 2

## 2022-03-11 MED ORDER — OXYCODONE HCL 5 MG/5ML PO SOLN: 5.0000 mg | Freq: Once | ORAL | Status: DC | PRN

## 2022-03-11 MED ORDER — KETOROLAC TROMETHAMINE 30 MG/ML IJ SOLN
30.0000 mg | Freq: Once | INTRAMUSCULAR | Status: AC
  Administered 2022-03-12: 30 mg via INTRAVENOUS
  Filled 2022-03-11: qty 1

## 2022-03-11 MED ORDER — IOHEXOL 350 MG/ML SOLN
75.0000 mL | Freq: Once | INTRAVENOUS | Status: AC | PRN
  Administered 2022-03-11: 75 mL via INTRAVENOUS

## 2022-03-11 MED ORDER — LIDOCAINE 2% (20 MG/ML) 5 ML SYRINGE
INTRAMUSCULAR | Status: AC
  Filled 2022-03-11: qty 5

## 2022-03-11 MED ORDER — LACTATED RINGERS IV SOLN: INTRAVENOUS | Status: DC

## 2022-03-11 MED ORDER — MIDAZOLAM HCL 2 MG/2ML IJ SOLN
INTRAMUSCULAR | Status: AC
  Filled 2022-03-11: qty 2

## 2022-03-11 MED ORDER — ORAL CARE MOUTH RINSE: 15.0000 mL | Freq: Once | OROMUCOSAL | Status: AC

## 2022-03-11 MED ORDER — OXYCODONE-ACETAMINOPHEN 5-325 MG PO TABS
1.0000 | ORAL_TABLET | ORAL | 0 refills | Status: DC | PRN
  Filled 2022-03-11: qty 30, 5d supply, fill #0

## 2022-03-11 MED ORDER — FENTANYL CITRATE (PF) 250 MCG/5ML IJ SOLN
INTRAMUSCULAR | Status: DC | PRN
  Administered 2022-03-11: 100 ug via INTRAVENOUS
  Administered 2022-03-11: 50 ug via INTRAVENOUS

## 2022-03-11 MED ORDER — LABETALOL HCL 5 MG/ML IV SOLN
10.0000 mg | Freq: Once | INTRAVENOUS | Status: AC
  Administered 2022-03-11: 10 mg via INTRAVENOUS

## 2022-03-11 MED ORDER — 0.9 % SODIUM CHLORIDE (POUR BTL) OPTIME
TOPICAL | Status: DC | PRN
  Administered 2022-03-11: 1000 mL

## 2022-03-11 MED ORDER — LIDOCAINE-EPINEPHRINE 2 %-1:100000 IJ SOLN
INTRAMUSCULAR | Status: DC | PRN
  Administered 2022-03-11: 16 mL

## 2022-03-11 MED ORDER — PHENYLEPHRINE 80 MCG/ML (10ML) SYRINGE FOR IV PUSH (FOR BLOOD PRESSURE SUPPORT)
PREFILLED_SYRINGE | INTRAVENOUS | Status: AC
  Filled 2022-03-11: qty 10

## 2022-03-11 MED ORDER — ROCURONIUM BROMIDE 10 MG/ML (PF) SYRINGE
PREFILLED_SYRINGE | INTRAVENOUS | Status: DC | PRN
  Administered 2022-03-11: 50 mg via INTRAVENOUS

## 2022-03-11 MED ORDER — FENTANYL CITRATE (PF) 250 MCG/5ML IJ SOLN
INTRAMUSCULAR | Status: AC
  Filled 2022-03-11: qty 5

## 2022-03-11 MED ORDER — DEXAMETHASONE SODIUM PHOSPHATE 10 MG/ML IJ SOLN
INTRAMUSCULAR | Status: DC | PRN
  Administered 2022-03-11: 10 mg via INTRAVENOUS

## 2022-03-11 MED ORDER — OXYCODONE HCL 5 MG PO TABS: 5.0000 mg | ORAL_TABLET | Freq: Once | ORAL | Status: DC | PRN

## 2022-03-11 MED ORDER — AMOXICILLIN 500 MG PO CAPS
500.0000 mg | ORAL_CAPSULE | Freq: Three times a day (TID) | ORAL | 0 refills | Status: DC
  Filled 2022-03-11: qty 30, 10d supply, fill #0

## 2022-03-11 MED ORDER — SUGAMMADEX SODIUM 200 MG/2ML IV SOLN
INTRAVENOUS | Status: DC | PRN
  Administered 2022-03-11: 147 mg via INTRAVENOUS

## 2022-03-11 MED ORDER — ACETAMINOPHEN 650 MG RE SUPP: 650.0000 mg | Freq: Four times a day (QID) | RECTAL | Status: DC | PRN

## 2022-03-11 MED ORDER — DEXAMETHASONE SODIUM PHOSPHATE 10 MG/ML IJ SOLN
INTRAMUSCULAR | Status: AC
  Filled 2022-03-11: qty 1

## 2022-03-11 MED ORDER — HYDRALAZINE HCL 20 MG/ML IJ SOLN: 10.0000 mg | INTRAMUSCULAR | Status: DC | PRN

## 2022-03-11 MED ORDER — CHLORHEXIDINE GLUCONATE 0.12 % MT SOLN
15.0000 mL | Freq: Once | OROMUCOSAL | Status: AC
  Administered 2022-03-11: 15 mL via OROMUCOSAL
  Filled 2022-03-11: qty 15

## 2022-03-11 MED ORDER — HYDROMORPHONE HCL 1 MG/ML IJ SOLN
0.5000 mg | INTRAMUSCULAR | Status: DC | PRN
  Administered 2022-03-12: 0.5 mg via INTRAVENOUS
  Filled 2022-03-11: qty 0.5

## 2022-03-11 MED ORDER — CEFAZOLIN SODIUM-DEXTROSE 2-4 GM/100ML-% IV SOLN
2.0000 g | INTRAVENOUS | Status: AC
  Administered 2022-03-11: 2 g via INTRAVENOUS
  Filled 2022-03-11: qty 100

## 2022-03-11 MED ORDER — LIDOCAINE 2% (20 MG/ML) 5 ML SYRINGE
INTRAMUSCULAR | Status: DC | PRN
  Administered 2022-03-11: 60 mg via INTRAVENOUS

## 2022-03-11 MED ORDER — LABETALOL HCL 5 MG/ML IV SOLN
INTRAVENOUS | Status: AC
  Filled 2022-03-11: qty 4

## 2022-03-11 MED ORDER — ONDANSETRON HCL 4 MG/2ML IJ SOLN
INTRAMUSCULAR | Status: AC
  Filled 2022-03-11: qty 2

## 2022-03-11 MED ORDER — MIDAZOLAM HCL 5 MG/5ML IJ SOLN
INTRAMUSCULAR | Status: DC | PRN
  Administered 2022-03-11: 2 mg via INTRAVENOUS

## 2022-03-11 MED ORDER — PROPOFOL 10 MG/ML IV BOLUS
INTRAVENOUS | Status: DC | PRN
  Administered 2022-03-11: 150 mg via INTRAVENOUS

## 2022-03-11 MED ORDER — ACETAMINOPHEN 10 MG/ML IV SOLN
1000.0000 mg | Freq: Four times a day (QID) | INTRAVENOUS | Status: DC
  Filled 2022-03-11 (×2): qty 100

## 2022-03-11 SURGICAL SUPPLY — 39 items
BAG COUNTER SPONGE SURGICOUNT (BAG) IMPLANT
BAG SPNG CNTER NS LX DISP (BAG)
BLADE SURG 15 STRL LF DISP TIS (BLADE) ×1 IMPLANT
BLADE SURG 15 STRL SS (BLADE) ×1
BUR CROSS CUT FISSURE 1.6 (BURR) ×1 IMPLANT
BUR EGG ELITE 4.0 (BURR) ×1 IMPLANT
CANISTER SUCT 3000ML PPV (MISCELLANEOUS) ×1 IMPLANT
COVER SURGICAL LIGHT HANDLE (MISCELLANEOUS) ×1 IMPLANT
DRAPE U-SHAPE 76X120 STRL (DRAPES) ×1 IMPLANT
FACESHIELD WRAPAROUND (MASK) ×1 IMPLANT
FACESHIELD WRAPAROUND OR TEAM (MASK) IMPLANT
GAUZE PACKING FOLDED 2  STR (GAUZE/BANDAGES/DRESSINGS) ×1
GAUZE PACKING FOLDED 2 STR (GAUZE/BANDAGES/DRESSINGS) ×1 IMPLANT
GLOVE BIO SURGEON STRL SZ 6.5 (GLOVE) IMPLANT
GLOVE BIO SURGEON STRL SZ7 (GLOVE) IMPLANT
GLOVE BIO SURGEON STRL SZ8 (GLOVE) ×1 IMPLANT
GLOVE BIOGEL PI IND STRL 6.5 (GLOVE) IMPLANT
GLOVE BIOGEL PI IND STRL 7.0 (GLOVE) IMPLANT
GOWN STRL REUS W/ TWL LRG LVL3 (GOWN DISPOSABLE) ×1 IMPLANT
GOWN STRL REUS W/ TWL XL LVL3 (GOWN DISPOSABLE) ×1 IMPLANT
GOWN STRL REUS W/TWL LRG LVL3 (GOWN DISPOSABLE) ×1
GOWN STRL REUS W/TWL XL LVL3 (GOWN DISPOSABLE) ×1
IV NS 1000ML (IV SOLUTION) ×1
IV NS 1000ML BAXH (IV SOLUTION) ×1 IMPLANT
KIT BASIN OR (CUSTOM PROCEDURE TRAY) ×1 IMPLANT
KIT TURNOVER KIT B (KITS) ×1 IMPLANT
NDL HYPO 25GX1X1/2 BEV (NEEDLE) ×2 IMPLANT
NEEDLE HYPO 25GX1X1/2 BEV (NEEDLE) ×2 IMPLANT
NS IRRIG 1000ML POUR BTL (IV SOLUTION) ×1 IMPLANT
PAD ARMBOARD 7.5X6 YLW CONV (MISCELLANEOUS) ×1 IMPLANT
SLEEVE IRRIGATION ELITE 7 (MISCELLANEOUS) ×1 IMPLANT
SPIKE FLUID TRANSFER (MISCELLANEOUS) ×1 IMPLANT
SPONGE SURGIFOAM ABS GEL 12-7 (HEMOSTASIS) IMPLANT
SUT CHROMIC 3 0 PS 2 (SUTURE) ×1 IMPLANT
SYR BULB IRRIG 60ML STRL (SYRINGE) ×1 IMPLANT
SYR CONTROL 10ML LL (SYRINGE) ×1 IMPLANT
TRAY ENT MC OR (CUSTOM PROCEDURE TRAY) ×1 IMPLANT
TUBING IRRIGATION (MISCELLANEOUS) ×1 IMPLANT
YANKAUER SUCT BULB TIP NO VENT (SUCTIONS) ×1 IMPLANT
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
## 2022-03-11 NOTE — ED Provider Triage Note (Signed)
Emergency Medicine Provider Triage Evaluation Note  Stephen Griffin , a 41 y.o. adult  was evaluated in triage.  Pt complains of difficulty swallowing, had 10 teeth pulled today, states that she is having worsening swelling in her mouth, is unable to swallow, cannot talk due to the swelling in her tongue, she has no other complaints.  Review of Systems  Positive: Tongue swelling, difficulty swallowing Negative: Chest pain, shortness of breath  Physical Exam  BP (!) 160/103   Pulse 89   Temp 98.2 F (36.8 C) (Oral)   Resp 16   SpO2 98%  Gen:   Awake, no distress   Resp:  Normal effort  MSK:   Moves extremities without difficulty  Other:  No trismus no torticollis she has extensive submandibular swelling, there is a large hematoma present, with multiple missing teeth after extraction.  Tongue uvula both midline controlling oral secretions, unable to talk due to swelling  Medical Decision Making  Medically screening exam initiated at 8:12 PM.  Appropriate orders placed.  Stephen Griffin was informed that the remainder of the evaluation will be completed by another provider, this initial triage assessment does not replace that evaluation, and the importance of remaining in the ED until their evaluation is complete.  Spoke with Dr. Vallery Ridge who came in evaluate the patient concern for rapidly expanding hematoma in the submandibular area, will obtain i-STAT and send down for CTA neck as well as maxillofacial, spoke with charge nurse who will room the patient soon as possible.  I called down to imaging who will bump her up on the list for a scan.     Marcello Fennel, PA-C 03/11/22 2017

## 2022-03-11 NOTE — Anesthesia Procedure Notes (Signed)
Procedure Name: Intubation Date/Time: 03/11/2022 8:58 AM  Performed by: Maude Leriche, CRNAPre-anesthesia Checklist: Patient identified, Emergency Drugs available, Suction available and Patient being monitored Patient Re-evaluated:Patient Re-evaluated prior to induction Oxygen Delivery Method: Circle system utilized Preoxygenation: Pre-oxygenation with 100% oxygen Induction Type: IV induction Ventilation: Mask ventilation without difficulty Laryngoscope Size: Miller and 2 Grade View: Grade I Tube type: Oral Tube size: 7.5 mm Number of attempts: 1 Airway Equipment and Method: Stylet Placement Confirmation: ETT inserted through vocal cords under direct vision, positive ETCO2 and breath sounds checked- equal and bilateral Secured at: 23 cm Tube secured with: Tape Dental Injury: Teeth and Oropharynx as per pre-operative assessment  Comments: Nasal ETT on national recall, utilized oral ETT
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
## 2022-03-11 NOTE — ED Provider Notes (Signed)
[facility] EMERGENCY DEPARTMENT Provider Note   CSN: [id number] Arrival date & time: 03/11/22  1751     History  Chief Complaint  Patient presents with   [facility] Swelling ( Throat)     Stephen Griffin is a 41 y.o. adult.  HPI Patient evaluated in triage due to concern for submental and left neck swelling status post dental extractions.  Patient had 10 teeth extracted same day.  Teeth of the uppers and 2 in the lowers.  Patient's swelling is in the lower mouth below the chin and the side of the neck.  She is not having significant swelling of the upper face.  She reports cannot talk due to the amount of swelling of the tongue.  Patient reports she is able to breathe but it is hard to swallow.  No blood thinners.    Home Medications Prior to Admission medications   Medication Sig Start Date End Date Taking? Authorizing Provider  amLODipine (NORVASC) 5 MG tablet Take 1 tablet (5 mg total) by mouth daily. Patient not taking: Reported on 02/16/2022 09/13/21   Tommy Medal, Lavell Islam, MD  amoxicillin (AMOXIL) 500 MG capsule Take 1 capsule (500 mg total) by mouth 3 (three) times daily. 03/11/22   [name], DMD  bacitracin-polymyxin b (POLYSPORIN) ophthalmic ointment Place into the right eye 4 (four) times daily. Place a 1/2 inch ribbon of ointment into the lower eyelid. Patient not taking: Reported on 09/13/2021 09/25/20   Bernarda Caffey, MD  Darunavir-Cobicistat-Emtricitabine-Tenofovir Alafenamide [facility]) 800-150-200-10 MG TABS Take 1 tablet by mouth daily with breakfast. 02/16/22   Tommy Medal, Lavell Islam, MD  dolutegravir (TIVICAY) 50 MG tablet Take 1 tablet (50 mg total) by mouth daily. 02/16/22   Truman Hayward, MD  doxycycline (VIBRA-TABS) 100 MG tablet TAKE 2 TABLETS BY MOUTH AS DIRECTED. TAKE AFTER UNPROTECTED SEX TO TO PREVENT CHLAMYDIA AND SYPHILIS 02/16/22 02/16/23  Tommy Medal, Lavell Islam, MD  dronabinol (MARINOL) 5 MG capsule Take 1 capsule (5 mg total)  by mouth 2 (two) times daily before a meal. 03/06/15   Tommy Medal, Lavell Islam, MD  estradiol valerate (DELESTROGEN) 20 MG/ML injection INJECT 0.5 MLS (10 MG TOTAL) INTO THE MUSCLE EVERY 14 DAYS. (DISCARD VIAL 28 DAYS AFTER FIRST USE) 02/16/22 02/16/23  Truman Hayward, MD  oxyCODONE-acetaminophen (PERCOCET) 5-325 MG tablet Take 1 tablet by mouth every 4 (four) hours as needed. 03/11/22   [name], DMD  rosuvastatin (CRESTOR) 20 MG tablet Take 1 tablet (20 mg total) by mouth daily. 02/16/22   Truman Hayward, MD  spironolactone (ALDACTONE) 100 MG tablet TAKE 1 TABLET BY MOUTH ONCE DAILY 02/16/22 02/16/23  Tommy Medal, Lavell Islam, MD  spironolactone (ALDACTONE) 50 MG tablet Take 1 tablet (50 mg total) by mouth daily. Take WITH '100mg'$  tablet for total of '150mg'$  daily 02/16/22   Tommy Medal, Lavell Islam, MD  SYRINGE-NEEDLE, DISP, 3 ML 18G X 1-1/2" 3 ML MISC INJECT 1 SYRINGE AS DIRECTED EVERY 14 (FOURTEEN) DAYS. 03/30/20 03/30/21  Truman Hayward, MD  Syringe/Needle, Disp, (SYRINGE 3CC/18GX1-1/2") 18G X 1-1/2" 3 ML MISC Inject 1 Syringe as directed every 14 (fourteen) days. 03/30/20   Truman Hayward, MD  triamcinolone ointment (KENALOG) 0.5 % Apply 1 application topically 2 (two) times daily. Patient not taking: Reported on 12/31/2020 07/25/19   Tommy Medal, Lavell Islam, MD      Allergies    Patient has no known allergies.  Review of Systems   Review of Systems  Physical Exam Updated Vital Signs BP (!) 160/103   Pulse 89   Temp 98.2 F (36.8 C) (Oral)   Resp 16   SpO2 98%  Physical Exam Constitutional:      Comments: Alert with clear mental status.  No respiratory distress at rest.  HENT:     Nose: Nose normal.     Mouth/Throat:     Comments: Multiple extractions of upper dentition.  No areas of active bleeding.  There is an extraction site at the lower left mandible.  No active bleeding from this area.  However, there is hematoma in the mucosa beneath the tongue, elevating it and around  the posterior inferior alveolar ridge into the buccal mucosa of the lateral cheek.  The tongue itself is not swollen.  It is not however elevated in the mouth making it difficult to visualize the posterior airway.  With the tongue depressor I can visualize a posterior airway which is patent but this did cause significant gagging and pain for the patient. Eyes:     Extraocular Movements: Extraocular movements intact.  Neck:     Comments: Full firmness of the submental space.  There is asymmetric swelling of the left lateral neck at the angle of the mandible.  This area is tender. Cardiovascular:     Rate and Rhythm: Normal rate and regular rhythm.  Pulmonary:     Effort: Pulmonary effort is normal.     Breath sounds: Normal breath sounds.  Abdominal:     General: There is no distension.     Palpations: Abdomen is soft.  Musculoskeletal:        General: Normal range of motion.  Skin:    General: Skin is warm and dry.  Neurological:     General: No focal deficit present.     Mental Status: She is oriented to person, place, and time.     ED Results / Procedures / Treatments   Labs (all labs ordered are listed, but only abnormal results are displayed) Labs Reviewed  BASIC METABOLIC PANEL - Abnormal; Notable for the following components:      Result Value   Glucose, Bld 142 (*)    All other components within normal limits  CBC WITH DIFFERENTIAL/PLATELET - Abnormal; Notable for the following components:   WBC 17.7 (*)    MCV 100.4 (*)    Neutro Abs 15.9 (*)    Abs Immature Granulocytes 0.10 (*)    All other components within normal limits  I-STAT CHEM 8, ED - Abnormal; Notable for the following components:   Glucose, Bld 142 (*)    Calcium, Ion 1.14 (*)    Hemoglobin 17.3 (*)    All other components within normal limits  MRSA NEXT GEN BY PCR, NASAL  PROTIME-INR  COMPREHENSIVE METABOLIC PANEL  CBC  MAGNESIUM    EKG EKG Interpretation  Date/Time:  Friday March 11 2022  19:57:06 EDT Ventricular Rate:  89 PR Interval:  156 QRS Duration: 82 QT Interval:  330 QTC Calculation: 401 R Axis:   74 Text Interpretation: Normal sinus rhythm Biatrial enlargement Abnormal ECG When compared with ECG of 11-Mar-2022 06:50, PREVIOUS ECG IS PRESENT no sig change from previous Confirmed by Charlesetta Shanks [phone]) on 03/11/2022 9:49:24 PM  Radiology CT Maxillofacial W Contrast  Result Date: 03/11/2022 CLINICAL DATA:  Dental extractions with oral cavity swelling EXAM: CT MAXILLOFACIAL WITH CONTRAST TECHNIQUE: Multidetector CT imaging of the maxillofacial structures was performed with  intravenous contrast. Multiplanar CT image reconstructions were also generated. RADIATION DOSE REDUCTION: This exam was performed according to the departmental dose-optimization program which includes automated exposure control, adjustment of the mA and/or kV according to patient size and/or use of iterative reconstruction technique. CONTRAST:  31m OMNIPAQUE IOHEXOL 350 MG/ML SOLN COMPARISON:  None Available. FINDINGS: Osseous: Multiple recent dental extractions Orbits: Left scleral band.  No acute abnormality. Sinuses: Clear Soft tissues: There is packing material within the oral cavity with rightward deviation of the tongue. The visualized upper airway is widely patent. Limited intracranial: Normal IMPRESSION: Multiple recent dental extractions with packing material within the oral cavity. No airway compromise. Electronically Signed   By: KUlyses JarredM.D.   On: 03/11/2022 21:46   CT Angio Neck W and/or Wo Contrast  Result Date: 03/11/2022 CLINICAL DATA:  Difficulty swallowing.  Recent dental procedure. EXAM: CT ANGIOGRAPHY NECK TECHNIQUE: Multidetector CT imaging of the neck was performed using the standard protocol during bolus administration of intravenous contrast. Multiplanar CT image reconstructions and MIPs were obtained to evaluate the vascular anatomy. Carotid stenosis measurements (when  applicable) are obtained utilizing NASCET criteria, using the distal internal carotid diameter as the denominator. RADIATION DOSE REDUCTION: This exam was performed according to the departmental dose-optimization program which includes automated exposure control, adjustment of the mA and/or kV according to patient size and/or use of iterative reconstruction technique. CONTRAST:  7110mOMNIPAQUE IOHEXOL 350 MG/ML SOLN COMPARISON:  None Available. FINDINGS: Aortic arch: Independent origin of the left vertebral artery from the aortic arch. Normal appearance of the aorta. Right carotid system: No evidence of dissection, stenosis (50% or greater) or occlusion. Left carotid system: No evidence of dissection, stenosis (50% or greater) or occlusion. Vertebral arteries: Codominant. No evidence of dissection, stenosis (50% or greater) or occlusion. Skeleton: No focal abnormality. Other neck: Status post dental extractions with packing material in the oral cavity. No airway narrowing. Upper chest: Clear IMPRESSION: 1. No acute vascular abnormality. 2. Status post dental extractions with packing material in the oral cavity. No airway narrowing. Electronically Signed   By: KeUlyses Jarred.D.   On: 03/11/2022 21:42    Procedures Procedures   CRITICAL CARE Performed by: MaCharlesetta Shanks Total critical care time: 30 minutes  Critical care time was exclusive of separately billable procedures and treating other patients.  Critical care was necessary to treat or prevent imminent or life-threatening deterioration.  Critical care was time spent personally by me on the following activities: development of treatment plan with patient and/or surrogate as well as nursing, discussions with consultants, evaluation of patient's response to treatment, examination of patient, obtaining history from patient or surrogate, ordering and performing treatments and interventions, ordering and review of laboratory studies, ordering and  review of radiographic studies, pulse oximetry and re-evaluation of patient's condition.  Medications Ordered in ED Medications  hydrALAZINE (APRESOLINE) injection 10-40 mg (has no administration in time range)  docusate sodium (COLACE) capsule 100 mg (has no administration in time range)  polyethylene glycol (MIRALAX / GLYCOLAX) packet 17 g (has no administration in time range)  ketorolac (TORADOL) 30 MG/ML injection 30 mg (has no administration in time range)  HYDROmorphone (DILAUDID) injection 0.5 mg (has no administration in time range)  acetaminophen (TYLENOL) suppository 650 mg (has no administration in time range)  acetaminophen (OFIRMEV) IV 1,000 mg (has no administration in time range)  iohexol (OMNIPAQUE) 350 MG/ML injection 75 mL (75 mLs Intravenous Contrast Given 03/11/22 2116)    ED Course/ Medical  Decision Making/ A&P                           Medical Decision Making Risk Decision regarding hospitalization.    Patient had multiple dental extractions today.  There is very large swelling of the submental floor of the mouth and extending into the lateral upper neck.  Do not appreciate any stridor at this time.  Patient is having difficulty speaking due to amount of swelling beneath the tongue.  However, he is breathing without difficulty.  There is swelling to the lateral upper left neck concerning for expanding hematoma.  We will proceed with close observation, CT of the neck with angiogram for vascular study as well, CBC for blood count.  20: 35 consult reviewed with Dr. Benjamine Mola ENT.  He advises that this is a dental condition and he does not manage the dental or oral surgery portion of these types of complications.  If the patient is having any issues with potential airway compromise, he suggested patient be intubated and he can put in a tracheostomy if needed.  If concerns for difficult airway, consult anesthesia for airway placement.   CT angiogram and CT soft tissue neck has  been reviewed by radiology.  I have also personally reviewed and examined these images.  Per radiology interpretation no airway impingement.  No note by radiology of active bleeding.  Recheck 22: 30 patient continues to have swelling.  No stridor but large swelling of the sublingual mucosa and submental space.  Also swelling to the lateral neck.  She perceives it has gotten worse since earlier exam.  See attached images.      Consult: Reviewed with Dr. Elsworth Soho intensivist.  Examined for potential observation for airway swelling with expanding hematoma.  Admitted to ICU for ongoing observation of oral hematoma with risk for airway impingement.          Final Clinical Impression(s) / ED Diagnoses Final diagnoses:  Hematoma of oral cavity    Rx / DC Orders ED Discharge Orders     None         Charlesetta Shanks, MD 03/11/22 Minus Breeding    Charlesetta Shanks, MD 03/13/22 1301

## 2022-03-11 NOTE — Anesthesia Postprocedure Evaluation (Signed)
Anesthesia Post Note  Patient: Stephen Griffin  Procedure(s) Performed: DENTAL RESTORATION/EXTRACTIONS     Patient location during evaluation: PACU Anesthesia Type: General Level of consciousness: awake and alert Pain management: pain level controlled Vital Signs Assessment: post-procedure vital signs reviewed and stable Respiratory status: spontaneous breathing, nonlabored ventilation, respiratory function stable and patient connected to nasal cannula oxygen Cardiovascular status: blood pressure returned to baseline and stable Postop Assessment: no apparent nausea or vomiting Anesthetic complications: no   No notable events documented.  Last Vitals:  Vitals:   03/11/22 1045 03/11/22 1048  BP: (!) 146/110   Pulse: 81 87  Resp: 13 15  Temp:    SpO2: 93% 93%    Last Pain:  Vitals:   03/11/22 1045  TempSrc:   PainSc: [name]

## 2022-03-11 NOTE — Op Note (Signed)
03/11/2022  10:06 AM  PATIENT:  [name]  41 y.o. adult  PRE-OPERATIVE DIAGNOSIS:   NON-RESTORABLE TEETH # 4, 6, 7, 8, 9, 10, 11, 12, 15, 29 SECONDARY TO DENTAL CARIES, BILATERAL MANDIBULAR LINGUAL TORI  POST-OPERATIVE DIAGNOSIS:  SAME+ TOOTH #12 NOT PRESENT  PROCEDURE:  Procedure(s): EXTRACTION TEETH # 4, 6, 7, 8, 9, 10, 11,  15, 29; ALVEOLOPLASTY RIGHT AND LEFT MAXILLA, REMOVAL BILATERAL MANDIBULAR LINGUAL TORI  SURGEON:  Surgeon(s): [name], DMD  ANESTHESIA:   local and general  EBL:  minimal  DRAINS: none   SPECIMEN:  No Specimen  COUNTS:  YES  PLAN OF CARE: Discharge to home after PACU  PATIENT DISPOSITION:  PACU - hemodynamically stable.   PROCEDURE DETAILS: Dictation # [id number]  Gae Bon, DMD 03/11/2022 10:06 AM

## 2022-03-11 NOTE — Progress Notes (Signed)
Notified Dr. Marcie Bal of pt's trend of BP. 158/104 and 153/100. No new orders received at this time. Per Dr. Marcie Bal, continue to monitor.   Jacqlyn Larsen, RN

## 2022-03-11 NOTE — ED Provider Notes (Signed)
Still no rooms available at this time, patient is remained up in triage, hemodynamically stable, no evidence of rest or distress, patient be handed off to Lorre Munroe, PA-C for continued care recommend immediate rooming    Aron Baba 03/11/22 2213    Charlesetta Shanks, MD 03/13/22 1303

## 2022-03-11 NOTE — H&P (Signed)
H&P documentation  -History and Physical Reviewed  -Patient has been re-examined  -No change in the plan of care  Stephen Griffin  

## 2022-03-11 NOTE — Transfer of Care (Signed)
Immediate Anesthesia Transfer of Care Note  Patient: Stephen Griffin  Procedure(s) Performed: DENTAL RESTORATION/EXTRACTIONS  Patient Location: PACU  Anesthesia Type:General  Level of Consciousness: awake, alert , and oriented  Airway & Oxygen Therapy: Patient Spontanous Breathing and Patient connected to nasal cannula oxygen  Post-op Assessment: Report given to RN, Post -op Vital signs reviewed and stable, Patient moving all extremities X 4, and Patient able to stick tongue midline  Post vital signs: Reviewed  Last Vitals:  Vitals Value Taken Time  BP 168/119 03/11/22 1012  Temp 98.6   Pulse 94 03/11/22 1013  Resp 13   SpO2 100 % 03/11/22 1013  Vitals shown include unvalidated device data.  Last Pain:  Vitals:   03/11/22 0711  TempSrc:   PainSc: 0-No pain         Complications: No notable events documented.

## 2022-03-11 NOTE — ED Notes (Signed)
Pt axo x4 and ambulatory, respirations even and unlabored. NAD noted

## 2022-03-11 NOTE — Op Note (Unsigned)
Stephen Griffin, Stephen Griffin MEDICAL RECORD NO: [id number] ACCOUNT NO: [id number] DATE OF BIRTH: 14-Feb-1981 FACILITY: [facility] LOCATION: [facility] PHYSICIAN: Gae Bon, DDS  Operative Report   DATE OF PROCEDURE: 03/11/2022  PREOPERATIVE DIAGNOSIS:  Nonrestorable teeth numbers 4, 6, 7, 8, 9, 10, 11, 12, 15, 29 secondary to dental caries and periodontitis, bilateral mandibular lingual tori.  POSTOPERATIVE DIAGNOSES:  Nonrestorable teeth numbers 4, 6, 7, 8, 9, 10, 11, 12, 15, 29 secondary to dental caries and periodontitis, bilateral mandibular lingual tori plus tooth #12 not present.  PROCEDURE:  Extraction teeth numbers 4, 6, 7, 8, 9, 10, 11, 15, 29. Alveoloplasty right and left maxilla, removal of bilateral mandibular lingual tori.  SURGEON:  Gae Bon, DDS  ANESTHESIA:  General oral intubation, Dr. Marcie Bal, attending.  DESCRIPTION OF PROCEDURE:  The patient was taken to the operating room and placed on the table in supine position.  General anesthesia was administered and an oral endotracheal tube was placed and secured.  The eyes were protected and the patient was  draped for surgery.  Timeout was performed.  The posterior pharynx was suctioned and a throat pack was placed.  2% lidocaine 1:100,000 epinephrine was infiltrated in an inferior alveolar block on the right and left side and in buccal and palatal  infiltration in the maxilla around the teeth to be removed.  A bite block was placed on the right side of the mouth.  A sweetheart retractor was used to retract the tongue.  The 15 blade was used to make an incision.  A bite block was placed in the left  side of the mouth and then the right side was operated first.  A 15 blade was used to make an incision beginning in the molar region of the right mandible in edentulous area at the crest of the ridge was carried forward to tooth #29 and then carried  buccally in the gingival sulcus and lingually and the lingual sulcus to the midline.   The periosteum was reflected lingually and buccally around tooth #29.  Then, tooth #29 was elevated and removed with the dental forceps.  Tissue was reflected to expose  the lingual torus.  A Seldin retractor was used to protect the lingual tissues and then the egg bur was used to remove the lingual torus.  The bone file was used to further smooth the area and then the area was irrigated and closed with 3-0 chromic.   Then, in the maxilla, the 15 blade was used to make an incision around tooth #4 carried forward to tooth 6, 7, 8, 9, 10 and 11.  The periosteum was reflected.  The teeth were elevated.  Teeth numbers 4, 6, 7, 8, 9 and 10 were removed with the dental  forceps.  The sockets were curetted.  The periosteum was reflected to expose the alveolar crest, which was irregular in contour.  Alveoplasty was therefore performed with an egg bur followed by the bone file.  Then, the right maxilla was closed with 3-0  chromic.  Then, the throat pack was removed.  The oral endotracheal tube was repositioned to the other side of the mouth.  Throat pack was repositioned and then the left side was operated with the 15 blade was used to make an incision on the edentulous  mandible posteriorly in the molar area on the left side, carried forward lingually in the gingival to the midline.  The periosteum was reflected.  The tissue was connected tenuously to the bone tuberosity  and was eventually dissected free of the  tuberosity.  Then, the egg bur was used to remove the tuberosity.  During removal of the tuberosity the soft tissue retraction was inadequate and a portion of the lingual tissue was lacerated by the bur.  At any rate, the torus reduction was completed  and then the bone file was used to further smooth the area then the incision was irrigated, suctioned and closed with 3-0 chromic.  The lacerated lingual tissue was approximately 1 cm and it was closed primarily with multiple interrupted sutures.  Then,   tooth #15 was removed using the #15 blade periosteal elevator, a dental elevator and the dental forceps.  The socket was curetted and closed with 3-0 chromic.  Then, attention was turned to the 11, 12 area.  The tissue was reflected and no root was  visualized in the #12 area.  Tooth #11 was elevated, but the tooth was ankylosed to the surrounding bone.  The Stryker handpiece with a fissure bur was used to remove circumferential bone around tooth #11 until the tooth will be grasped and removed.   Then, the alveoplasty was performed using the egg bur followed by the bone file.  Then, the maxilla was closed with 3-0 chromic.  The oral cavity was then irrigated and suctioned.  The throat pack was removed.  The patient was left under the care of  anesthesia for transported to recovery with plans for discharge home through Day Surgery.  ESTIMATED BLOOD LOSS:  Minimal.  COMPLICATIONS:  None.  SPECIMENS:  None.   PUS D: 03/11/2022 10:13:24 am T: 03/11/2022 1:37:00 pm  JOB: [id number]/ [id number]

## 2022-03-11 NOTE — ED Triage Notes (Signed)
Patient reports throat swelling this afternoon after dental surgery today , airway intact /respirations unlabored .

## 2022-03-12 ENCOUNTER — Encounter (HOSPITAL_COMMUNITY): Payer: Self-pay | Admitting: Pulmonary Disease

## 2022-03-12 DIAGNOSIS — R221 Localized swelling, mass and lump, neck: Secondary | ICD-10-CM | POA: Diagnosis not present

## 2022-03-12 LAB — COMPREHENSIVE METABOLIC PANEL
ALT: 32 U/L (ref 0–44)
AST: 29 U/L (ref 15–41)
Albumin: 3.6 g/dL (ref 3.5–5.0)
Alkaline Phosphatase: 68 U/L (ref 38–126)
Anion gap: 6 (ref 5–15)
BUN: 9 mg/dL (ref 6–20)
CO2: 24 mmol/L (ref 22–32)
Calcium: 8.8 mg/dL — ABNORMAL LOW (ref 8.9–10.3)
Chloride: 108 mmol/L (ref 98–111)
Creatinine, Ser: 1.03 mg/dL (ref 0.61–1.24)
GFR, Estimated: >60 mL/min (ref >60)
Glucose, Bld: 109 mg/dL — ABNORMAL HIGH (ref 70–99)
Potassium: 3.4 mmol/L — ABNORMAL LOW (ref 3.5–5.1)
Sodium: 138 mmol/L (ref 135–145)
Total Bilirubin: 0.7 mg/dL (ref 0.3–1.2)
Total Protein: 6.7 g/dL (ref 6.5–8.1)

## 2022-03-12 LAB — MAGNESIUM: Magnesium: 1.8 mg/dL (ref 1.7–2.4)

## 2022-03-12 LAB — GLUCOSE, CAPILLARY
Glucose-Capillary: 118 mg/dL — ABNORMAL HIGH (ref 70–99)
Glucose-Capillary: 94 mg/dL (ref 70–99)
Glucose-Capillary: 96 mg/dL (ref 70–99)
Glucose-Capillary: 98 mg/dL (ref 70–99)

## 2022-03-12 LAB — CBC
HCT: 41.3 % (ref 39.0–52.0)
Hemoglobin: 13.7 g/dL (ref 13.0–17.0)
MCH: 32.5 pg (ref 26.0–34.0)
MCHC: 33.2 g/dL (ref 30.0–36.0)
MCV: 97.9 fL (ref 80.0–100.0)
Platelets: 312 10*3/uL (ref 150–400)
RBC: 4.22 MIL/uL (ref 4.22–5.81)
RDW: 11.9 % (ref 11.5–15.5)
WBC: 13.7 10*3/uL — ABNORMAL HIGH (ref 4.0–10.5)
nRBC: 0 % (ref 0.0–0.2)

## 2022-03-12 LAB — MRSA NEXT GEN BY PCR, NASAL: MRSA by PCR Next Gen: NOT DETECTED

## 2022-03-12 MED ORDER — CHLORHEXIDINE GLUCONATE CLOTH 2 % EX PADS
6.0000 | MEDICATED_PAD | Freq: Every day | CUTANEOUS | Status: DC
  Administered 2022-03-12 (×2): 6 via TOPICAL

## 2022-03-12 MED ORDER — ORAL CARE MOUTH RINSE: 15.0000 mL | OROMUCOSAL | Status: DC | PRN

## 2022-03-12 MED ORDER — LIDOCAINE-EPINEPHRINE (PF) 2 %-1:200000 IJ SOLN
INTRAMUSCULAR | Status: AC
  Administered 2022-03-12: 2 mL
  Filled 2022-03-12: qty 20

## 2022-03-12 MED ORDER — LIDOCAINE HCL (PF) 1 % IJ SOLN: 2.0000 mL | Freq: Once | INTRAMUSCULAR | Status: DC

## 2022-03-12 MED ORDER — AMOXICILLIN-POT CLAVULANATE 875-125 MG PO TABS
1.0000 | ORAL_TABLET | Freq: Two times a day (BID) | ORAL | Status: DC
  Administered 2022-03-12: 1 via ORAL
  Filled 2022-03-12: qty 1

## 2022-03-12 MED ORDER — DARUN-COBIC-EMTRICIT-TENOFAF 800-150-200-10 MG PO TABS
1.0000 | ORAL_TABLET | Freq: Every day | ORAL | Status: DC
  Administered 2022-03-12: 1 via ORAL
  Filled 2022-03-12: qty 1

## 2022-03-12 MED ORDER — LIDOCAINE HCL (PF) 1 % IJ SOLN
INTRAMUSCULAR | Status: AC
  Filled 2022-03-12: qty 5

## 2022-03-12 MED ORDER — SODIUM CHLORIDE 0.9 % IV SOLN
3.0000 g | Freq: Four times a day (QID) | INTRAVENOUS | Status: DC
  Administered 2022-03-12 (×2): 3 g via INTRAVENOUS
  Filled 2022-03-12 (×2): qty 8

## 2022-03-12 MED ORDER — AMLODIPINE BESYLATE 5 MG PO TABS
5.0000 mg | ORAL_TABLET | Freq: Every day | ORAL | Status: DC
  Administered 2022-03-12: 5 mg via ORAL
  Filled 2022-03-12: qty 1

## 2022-03-12 MED ORDER — DOLUTEGRAVIR SODIUM 50 MG PO TABS
50.0000 mg | ORAL_TABLET | Freq: Every day | ORAL | Status: DC
  Administered 2022-03-12: 50 mg via ORAL
  Filled 2022-03-12: qty 1

## 2022-03-12 MED ORDER — AMOXICILLIN-POT CLAVULANATE 875-125 MG PO TABS: 1.0000 | ORAL_TABLET | Freq: Two times a day (BID) | ORAL | 0 refills | Status: AC

## 2022-03-12 MED ORDER — ROSUVASTATIN CALCIUM 20 MG PO TABS
20.0000 mg | ORAL_TABLET | Freq: Every day | ORAL | Status: DC
  Administered 2022-03-12: 20 mg via ORAL
  Filled 2022-03-12: qty 1

## 2022-03-12 MED ORDER — ACETAMINOPHEN 325 MG PO TABS: 650.0000 mg | ORAL_TABLET | Freq: Four times a day (QID) | ORAL | Status: DC | PRN

## 2022-03-12 NOTE — Consult Note (Signed)
Stephen Griffin Consult Note:   SUBJECTIVE: 41 yo transgender s/p Multiple dental extractions, maxillary alveoloplasty, bilateral mandibular lingual tori reduction 03/11/2022. Presented to ER later that day with concern for swelling in mouth and neck. CT showed deviation of tongue and oral swelling. Upper airway patent.   OBJECTIVE:  Vitals: Blood pressure (!) 146/110, pulse 87, temperature 98.1 F (36.7 C), temperature source Oral, resp. rate 15, height '5\' 11"'$  (1.803 m), weight 73.5 kg, SpO2 93 %. Lab results:No results found for this or any previous visit (from the past 37 hour(s)). Radiology Results: CT Maxillofacial W Contrast  Result Date: 03/11/2022 CLINICAL DATA:  Dental extractions with oral cavity swelling EXAM: CT MAXILLOFACIAL WITH CONTRAST TECHNIQUE: Multidetector CT imaging of the maxillofacial structures was performed with intravenous contrast. Multiplanar CT image reconstructions were also generated. RADIATION DOSE REDUCTION: This exam was performed according to the departmental dose-optimization program which includes automated exposure control, adjustment of the mA and/or kV according to patient size and/or use of iterative reconstruction technique. CONTRAST:  15m OMNIPAQUE IOHEXOL 350 MG/ML SOLN COMPARISON:  None Available. FINDINGS: Osseous: Multiple recent dental extractions Orbits: Left scleral band.  No acute abnormality. Sinuses: Clear Soft tissues: There is packing material within the oral cavity with rightward deviation of the tongue. The visualized upper airway is widely patent. Limited intracranial: Normal IMPRESSION: Multiple recent dental extractions with packing material within the oral cavity. No airway compromise. Electronically Signed   By: KUlyses JarredM.D.   On: 03/11/2022 21:46   CT Angio Neck W and/or Wo Contrast  Result Date: 03/11/2022 CLINICAL DATA:  Difficulty swallowing.  Recent dental procedure. EXAM: CT ANGIOGRAPHY NECK TECHNIQUE: Multidetector CT  imaging of the neck was performed using the standard protocol during bolus administration of intravenous contrast. Multiplanar CT image reconstructions and MIPs were obtained to evaluate the vascular anatomy. Carotid stenosis measurements (when applicable) are obtained utilizing NASCET criteria, using the distal internal carotid diameter as the denominator. RADIATION DOSE REDUCTION: This exam was performed according to the departmental dose-optimization program which includes automated exposure control, adjustment of the mA and/or kV according to patient size and/or use of iterative reconstruction technique. CONTRAST:  741mOMNIPAQUE IOHEXOL 350 MG/ML SOLN COMPARISON:  None Available. FINDINGS: Aortic arch: Independent origin of the left vertebral artery from the aortic arch. Normal appearance of the aorta. Right carotid system: No evidence of dissection, stenosis (50% or greater) or occlusion. Left carotid system: No evidence of dissection, stenosis (50% or greater) or occlusion. Vertebral arteries: Codominant. No evidence of dissection, stenosis (50% or greater) or occlusion. Skeleton: No focal abnormality. Other neck: Status post dental extractions with packing material in the oral cavity. No airway narrowing. Upper chest: Clear IMPRESSION: 1. No acute vascular abnormality. 2. Status post dental extractions with packing material in the oral cavity. No airway narrowing. Electronically Signed   By: KeUlyses Jarred.D.   On: 03/11/2022 21:42   General appearance: alert, cooperative, and no distress Head: Normocephalic, without obvious abnormality, atraumatic Eyes: conjunctivae/corneas clear. PERRL, EOM's intact. Fundi benign. Nose: Nares normal. Septum midline. Mucosa normal. No drainage or sinus tenderness. Throat: Anterior floor of mouth submucosal erythematous swelling behind lower incisors approximately 1 cm x 1.5 cm, left lateral floor of mouth erythematous swelling approximately 2 cm x 2 cm. Extraction  sites hemostatic, sutures intact.  Neck: no adenopathy and but mild submandibular edema soft, non-indurated.  ASSESSMENT: Mandibular floor of mouth hematomas from tori removal surgery. Airway patient. Pt with no respiratory distress.  PLAN: Attempted  aspiration of hematomas bedside with local anesthesia. @% lidocaine 1:100,000 epinephrine x 12 cc. 18 gauge needle inserted into both anterior and lateral edematous areas. No blood or fluid aspirated. Hematomas should dissipate over time. Recommend pt continue PO antibiotics, pain medicine, soft diet, saline rinses at discharge.    [name] 03/12/2022

## 2022-03-12 NOTE — Progress Notes (Addendum)
Stephen Griffin PROGRESS NOTE:   SUBJECTIVE: 41 yo transgender s/p Multiple dental extractions, maxillary alveoloplasty, bilateral mandibular lingual tori reduction 03/11/2022. Presented to ER later that day with concern for swelling in mouth and neck. CT showed deviation of tongue and oral swelling. Upper airway patent.   OBJECTIVE:  Vitals: Blood pressure (!) 146/110, pulse 87, temperature 98.1 F (36.7 C), temperature source Oral, resp. rate 15, height '5\' 11"'$  (1.803 m), weight 73.5 kg, SpO2 93 %. Lab results:No results found for this or any previous visit (from the past 75 hour(s)). Radiology Results: CT Maxillofacial W Contrast  Result Date: 03/11/2022 CLINICAL DATA:  Dental extractions with oral cavity swelling EXAM: CT MAXILLOFACIAL WITH CONTRAST TECHNIQUE: Multidetector CT imaging of the maxillofacial structures was performed with intravenous contrast. Multiplanar CT image reconstructions were also generated. RADIATION DOSE REDUCTION: This exam was performed according to the departmental dose-optimization program which includes automated exposure control, adjustment of the mA and/or kV according to patient size and/or use of iterative reconstruction technique. CONTRAST:  6m OMNIPAQUE IOHEXOL 350 MG/ML SOLN COMPARISON:  None Available. FINDINGS: Osseous: Multiple recent dental extractions Orbits: Left scleral band.  No acute abnormality. Sinuses: Clear Soft tissues: There is packing material within the oral cavity with rightward deviation of the tongue. The visualized upper airway is widely patent. Limited intracranial: Normal IMPRESSION: Multiple recent dental extractions with packing material within the oral cavity. No airway compromise. Electronically Signed   By: KUlyses JarredM.D.   On: 03/11/2022 21:46   CT Angio Neck W and/or Wo Contrast  Result Date: 03/11/2022 CLINICAL DATA:  Difficulty swallowing.  Recent dental procedure. EXAM: CT ANGIOGRAPHY NECK TECHNIQUE: Multidetector CT  imaging of the neck was performed using the standard protocol during bolus administration of intravenous contrast. Multiplanar CT image reconstructions and MIPs were obtained to evaluate the vascular anatomy. Carotid stenosis measurements (when applicable) are obtained utilizing NASCET criteria, using the distal internal carotid diameter as the denominator. RADIATION DOSE REDUCTION: This exam was performed according to the departmental dose-optimization program which includes automated exposure control, adjustment of the mA and/or kV according to patient size and/or use of iterative reconstruction technique. CONTRAST:  740mOMNIPAQUE IOHEXOL 350 MG/ML SOLN COMPARISON:  None Available. FINDINGS: Aortic arch: Independent origin of the left vertebral artery from the aortic arch. Normal appearance of the aorta. Right carotid system: No evidence of dissection, stenosis (50% or greater) or occlusion. Left carotid system: No evidence of dissection, stenosis (50% or greater) or occlusion. Vertebral arteries: Codominant. No evidence of dissection, stenosis (50% or greater) or occlusion. Skeleton: No focal abnormality. Other neck: Status post dental extractions with packing material in the oral cavity. No airway narrowing. Upper chest: Clear IMPRESSION: 1. No acute vascular abnormality. 2. Status post dental extractions with packing material in the oral cavity. No airway narrowing. Electronically Signed   By: KeUlyses Jarred.D.   On: 03/11/2022 21:42   General appearance: alert, cooperative, and no distress Head: Normocephalic, without obvious abnormality, atraumatic Eyes: conjunctivae/corneas clear. PERRL, EOM's intact. Fundi benign. Nose: Nares normal. Septum midline. Mucosa normal. No drainage or sinus tenderness. Throat: Anterior floor of mouth submucosal erythematous swelling behind lower incisors approximately 1 cm x 1.5 cm, left lateral floor of mouth erythematous swelling approximately 2 cm x 2 cm. Extraction  sites hemostatic, sutures intact.  Neck: no adenopathy and but mild submandibular edema soft, non-indurated.  ASSESSMENT: Mandibular floor of mouth hematomas from tori removal surgery. Airway patient. Pt with no respiratory distress.  PLAN: Attempted  aspiration of hematomas bedside with local anesthesia. No blood or fluid aspirated. Hematomas  should dissipate over time. Recommend pt continue PO antibiotics, pain medicine, soft diet, saline rinses at discharge.    [name] 03/12/2022

## 2022-03-12 NOTE — Progress Notes (Signed)
eLink Physician-Brief Progress Note Patient Name: [name] DOB: [birth year] MRN: [id number]   Date of Service  03/12/2022  HPI/Events of Note  HIV positive patient with a dental abscess s/p orofacial surgery under general anesthesia last evening, post-operatively he developed an oral hematoma with significant swelling but no direct airway compromise to this point, he is admitted to the ICU for close airway monitoring.  eICU Interventions  New Patient Evaluation.        Kerry Kass Charlayne Vultaggio 03/12/2022, 12:16 AM

## 2022-03-12 NOTE — Plan of Care (Signed)

## 2022-03-12 NOTE — Progress Notes (Signed)
Pharmacy Antibiotic Note  Stephen Griffin is a 41 y.o. transgender male admitted on 03/11/2022 with  submental swelling/tongue hematoma status post dental extraction .  Pharmacy has been consulted for Unasyn dosing.  Plan: Unasyn 3g IV Q6H.  Temp (24hrs), Avg:98.2 F (36.8 C), Min:98.1 F (36.7 C), Max:98.2 F (36.8 C)  Recent Labs  Lab 03/11/22 2022 03/11/22 2028  WBC 17.7*  --   CREATININE 1.15 1.00    Estimated Creatinine Clearance (by C-G formula based on SCr of 1 mg/dL) Male: 82.7 mL/min Male: 101.1 mL/min    No Known Allergies   Thank you for allowing pharmacy to be a part of this patient's care.  Wynona Neat, PharmD, BCPS  03/12/2022 12:02 AM

## 2022-03-12 NOTE — Progress Notes (Signed)
   NAME:  Stephen Griffin, MRN:  [id number], DOB:  1981/04/12, LOS: 1 ADMISSION DATE:  03/11/2022, CONSULTATION DATE:  03/12/2022  REFERRING MD:  pfeiffer, EDP , CHIEF COMPLAINT:  neck swelling   History of Present Illness:  41 year old transgender with HIV underwent extraction of several teeth numbers 0 due to dental caries with alveoloplasty and removal of bilateral mandibular lingual tori today by oral surgeon under general anesthesia.  She presented to the ED with neck swelling. CT maxillofacial showed swelling in the oral cavity with rightward deviation of tongue.  Upper airway was widely patent. CT neck did not show any evidence of airway narrowing. 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 Hungry. Pain under control.  Objective   Blood pressure 116/79, pulse 80, temperature 98 F (36.7 C), temperature source Axillary, resp. rate 14, SpO2 97 %.        Intake/Output Summary (Last 24 hours) at 03/12/2022 0734 Last data filed at 03/12/2022 0600 Gross per 24 hour  Intake 200 ml  Output 200 ml  Net 0 ml   There were no vitals filed for this visit.  Examination: No distress Mild-moderate anterior submandibular swelling Lungs clear Ext warm AO x 3  Resolved Hospital Problem list     Assessment & Plan:  Submental swelling/tongue hematoma status post dental extraction and removal of mandibular tori by oral surgery - No e/o airway compromise, oral surgery to see this AM, defer dispo and PO status to them, ?can go home  Hypertension -amlodipine HIV -ART  Best Practice (right click and "Reselect all  SmartList Selections" daily)   Diet/type: NPO w/ oral meds DVT prophylaxis: SCD GI prophylaxis: N/A Lines: N/A Foley:  N/A Code Status:  full code Last date of multidisciplinary goals of care discussion [NA]  Erskine Emery MD PCCM

## 2022-03-12 NOTE — ED Provider Notes (Signed)
Received call from oral surgery office regarding this patient as they had been paged earlier in the night. The patient is now admitted to the ICU, I paged the ICU physician with the providers phone number to allow them to speak to see if they needed them to see the patient or not. Dr. Bari Mantis pager number provided as well. I had no other involvement in this patients care.    Rondarius Kadrmas, Corene Cornea, MD 03/12/22 [phone]

## 2022-03-13 NOTE — Discharge Summary (Signed)
Physician Discharge Summary  Patient ID: Stephen Griffin MRN: [id number] DOB/AGE: 11-23-1980 41 y.o.  Admit date: 03/11/2022 Discharge date: 03/13/2022  Admission Diagnoses:  Discharge Diagnoses:  Principal Problem:   Submental mass   Discharged Condition: {condition:18240}  Hospital Course: ***  Consults: {consultation:18241}  Significant Diagnostic Studies: {diagnostics:18242}  Treatments: {Tx:18249}  Discharge Exam: Blood pressure (!) 145/104, pulse 89, temperature 98.3 F (36.8 C), temperature source Oral, resp. rate 18, SpO2 98 %. {physical CHYI:5027741}  Disposition: Discharge disposition: 01-Home or Self Care        Allergies as of 03/12/2022   No Known Allergies      Medication List     STOP taking these medications    amLODipine 5 MG tablet Commonly known as: NORVASC   amoxicillin 500 MG capsule Commonly known as: AMOXIL   B-D 3CC LUER-LOK SYR 18GX1-1/2 18G X 1-1/2" 3 ML Misc Generic drug: SYRINGE-NEEDLE (DISP) 3 ML   bacitracin-polymyxin b ophthalmic ointment Commonly known as: POLYSPORIN   dronabinol 5 MG capsule Commonly known as: Marinol   rosuvastatin 20 MG tablet Commonly known as: Crestor   SYRINGE 3CC/18GX1-1/2" 18G X 1-1/2" 3 ML Misc   triamcinolone ointment 0.5 % Commonly known as: KENALOG       TAKE these medications    amoxicillin-clavulanate 875-125 MG tablet Commonly known as: AUGMENTIN Take 1 tablet by mouth 2 (two) times daily for 10 days.   doxycycline 100 MG tablet Commonly known as: VIBRA-TABS TAKE 2 TABLETS BY MOUTH AS DIRECTED. TAKE AFTER UNPROTECTED SEX TO TO PREVENT CHLAMYDIA AND SYPHILIS What changed:  how much to take how to take this when to take this reasons to take this   estradiol valerate 20 MG/ML injection Commonly known as: DELESTROGEN INJECT 0.5 MLS (10 MG TOTAL) INTO THE MUSCLE EVERY 14 DAYS. (DISCARD VIAL 28 DAYS AFTER FIRST USE) What changed:  how much to take how to take  this when to take this   oxyCODONE-acetaminophen 5-325 MG tablet Commonly known as: Percocet Take 1 tablet by mouth every 4 (four) hours as needed. What changed:  when to take this reasons to take this   spironolactone 50 MG tablet Commonly known as: ALDACTONE Take 1 tablet (50 mg total) by mouth daily. Take WITH '100mg'$  tablet for total of '150mg'$  daily What changed:  additional instructions Another medication with the same name was removed. Continue taking this medication, and follow the directions you see here.   Symtuza 800-150-200-10 MG Tabs Generic drug: Darunavir-Cobicistat-Emtricitabine-Tenofovir Alafenamide Take 1 tablet by mouth daily with breakfast.   Tivicay 50 MG tablet Generic drug: dolutegravir Take 1 tablet (50 mg total) by mouth daily.        Follow-up Information     [name], DMD Follow up in 1 week(s).   Specialty: Oral Surgery Contact information: [place] [address] [phone]                 Signed: Candee Furbish 03/13/2022, 5:05 PM

## 2022-03-14 ENCOUNTER — Telehealth: Payer: Self-pay

## 2022-03-14 ENCOUNTER — Other Ambulatory Visit (HOSPITAL_COMMUNITY): Payer: Self-pay

## 2022-03-14 NOTE — Patient Outreach (Signed)
Care Coordination  03/14/2022  Stephen Griffin 06-15-1980 [id number]  Transition Care Management Unsuccessful Follow-up Telephone Call  Date of discharge and from where:  03/12/22 [facility]   Attempts:  1st Attempt  Reason for unsuccessful TCM follow-up call:  Unable to reach patient  Mickel Fuchs, BSW, [place] Medicaid Team  [phone]

## 2022-03-15 ENCOUNTER — Telehealth: Payer: Self-pay

## 2022-03-15 NOTE — Patient Outreach (Signed)
Care Coordination  03/15/2022  [name] 06/27/1980 [id number]   Transition Care Management Unsuccessful Follow-up Telephone Call  Date of discharge and from where:  03/12/22 [facility]   Attempts:  2nd Attempt  Reason for unsuccessful TCM follow-up call:  Unable to reach patient   Mickel Fuchs, BSW, [place] Medicaid Team  [phone]

## 2022-03-16 ENCOUNTER — Other Ambulatory Visit (HOSPITAL_COMMUNITY): Payer: Self-pay

## 2022-04-05 ENCOUNTER — Other Ambulatory Visit (HOSPITAL_COMMUNITY): Payer: Self-pay

## 2022-04-18 ENCOUNTER — Other Ambulatory Visit (HOSPITAL_COMMUNITY): Payer: Self-pay

## 2022-05-09 ENCOUNTER — Other Ambulatory Visit (HOSPITAL_COMMUNITY): Payer: Self-pay

## 2022-05-10 ENCOUNTER — Other Ambulatory Visit: Payer: Self-pay

## 2022-05-10 ENCOUNTER — Other Ambulatory Visit (HOSPITAL_COMMUNITY): Payer: Self-pay

## 2022-05-11 ENCOUNTER — Other Ambulatory Visit (HOSPITAL_COMMUNITY): Payer: Self-pay

## 2022-05-11 ENCOUNTER — Other Ambulatory Visit: Payer: Self-pay

## 2022-05-12 ENCOUNTER — Other Ambulatory Visit: Payer: Self-pay

## 2022-05-12 DIAGNOSIS — Z113 Encounter for screening for infections with a predominantly sexual mode of transmission: Secondary | ICD-10-CM

## 2022-05-12 DIAGNOSIS — B20 Human immunodeficiency virus [HIV] disease: Secondary | ICD-10-CM

## 2022-05-13 ENCOUNTER — Other Ambulatory Visit: Payer: Medicaid Other

## 2022-05-13 ENCOUNTER — Other Ambulatory Visit (HOSPITAL_COMMUNITY)
Admission: RE | Admit: 2022-05-13 | Discharge: 2022-05-13 | Disposition: A | Discharge status: Home / Self Care | Payer: Medicaid Other | Source: Ambulatory Visit | Attending: Infectious Disease | Admitting: Infectious Disease

## 2022-05-13 ENCOUNTER — Other Ambulatory Visit: Payer: Self-pay

## 2022-05-13 DIAGNOSIS — B20 Human immunodeficiency virus [HIV] disease: Secondary | ICD-10-CM

## 2022-05-13 DIAGNOSIS — Z113 Encounter for screening for infections with a predominantly sexual mode of transmission: Secondary | ICD-10-CM | POA: Diagnosis not present

## 2022-05-16 LAB — URINE CYTOLOGY ANCILLARY ONLY
Chlamydia: NEGATIVE
Comment: NEGATIVE
Comment: NORMAL
Neisseria Gonorrhea: NEGATIVE

## 2022-05-17 ENCOUNTER — Telehealth: Payer: Self-pay

## 2022-05-17 LAB — COMPLETE METABOLIC PANEL WITH GFR
AG Ratio: 1.3 (calc) (ref 1.0–2.5)
ALT: 44 U/L (ref 9–46)
AST: 42 U/L — ABNORMAL HIGH (ref 10–40)
Albumin: 4.7 g/dL (ref 3.6–5.1)
Alkaline phosphatase (APISO): 88 U/L (ref 36–130)
BUN: 9 mg/dL (ref 7–25)
CO2: 24 mmol/L (ref 20–32)
Calcium: 9.8 mg/dL (ref 8.6–10.3)
Chloride: 108 mmol/L (ref 98–110)
Creat: 0.98 mg/dL (ref 0.60–1.29)
Globulin: 3.6 g/dL (calc) (ref 1.9–3.7)
Glucose, Bld: 84 mg/dL (ref 65–99)
Potassium: 3.8 mmol/L (ref 3.5–5.3)
Sodium: 141 mmol/L (ref 135–146)
Total Bilirubin: 0.3 mg/dL (ref 0.2–1.2)
Total Protein: 8.3 g/dL — ABNORMAL HIGH (ref 6.1–8.1)
eGFR: 99 mL/min/{1.73_m2} (ref >=60)

## 2022-05-17 LAB — T-HELPER CELLS (CD4) COUNT (NOT AT ARMC)
Absolute CD4: 583 cells/uL (ref 490–1740)
CD4 T Helper %: 26 % — ABNORMAL LOW (ref 30–61)
Total lymphocyte count: 2241 cells/uL (ref 850–3900)

## 2022-05-17 LAB — CBC WITH DIFFERENTIAL/PLATELET
Absolute Monocytes: 504 cells/uL (ref 200–950)
Basophils Absolute: 29 cells/uL (ref 0–200)
Basophils Relative: 0.6 %
Eosinophils Absolute: 19 cells/uL (ref 15–500)
Eosinophils Relative: 0.4 %
HCT: 46.7 % (ref 38.5–50.0)
Hemoglobin: 15.9 g/dL (ref 13.2–17.1)
Lymphs Abs: 2002 cells/uL (ref 850–3900)
MCH: 32.3 pg (ref 27.0–33.0)
MCHC: 34 g/dL (ref 32.0–36.0)
MCV: 94.9 fL (ref 80.0–100.0)
MPV: 9.5 fL (ref 7.5–12.5)
Monocytes Relative: 10.5 %
Neutro Abs: 2246 cells/uL (ref 1500–7800)
Neutrophils Relative %: 46.8 %
Platelets: 395 10*3/uL (ref 140–400)
RBC: 4.92 10*6/uL (ref 4.20–5.80)
RDW: 11.8 % (ref 11.0–15.0)
Total Lymphocyte: 41.7 %
WBC: 4.8 10*3/uL (ref 3.8–10.8)

## 2022-05-17 LAB — RPR: RPR Ser Ql: REACTIVE — AB

## 2022-05-17 LAB — RPR TITER: RPR Titer: 1:4 {titer} — ABNORMAL HIGH

## 2022-05-17 LAB — HIV-1 RNA QUANT-NO REFLEX-BLD
HIV 1 RNA Quant: 10900 Copies/mL — ABNORMAL HIGH
HIV-1 RNA Quant, Log: 4.04 Log cps/mL — ABNORMAL HIGH

## 2022-05-17 LAB — T PALLIDUM AB: T Pallidum Abs: POSITIVE — AB

## 2022-05-17 NOTE — Telephone Encounter (Signed)
Staff attempted connect with patient via phone, no answer. Staff followed up with Mychart message concerning increase viral load.   Stephen Mcalpine, LPN

## 2022-05-17 NOTE — Telephone Encounter (Signed)
-----   Message from Truman Hayward, MD sent at 05/17/2022  8:28 AM EST ----- Chocolate must not be taking her meds. Can we investigate? ----- Message ----- From: Cheyenne Adas Lab Results In Sent: 05/13/2022  10:55 PM EST To: Truman Hayward, MD

## 2022-06-03 ENCOUNTER — Ambulatory Visit: Payer: Medicaid Other | Admitting: Family

## 2022-06-06 ENCOUNTER — Telehealth: Payer: Self-pay

## 2022-06-06 NOTE — Telephone Encounter (Signed)
-----   Message from Eugenia Mcalpine, LPN sent at 06/30/9756  4:51 PM EST ----- Regarding: FW: Appt Please try your best to reach out to patient to at least schedule with pharmacy. Dr. VD no longer has any other appointment available. I will say that it has been hard to reach her lately.  Thanks ~Shaquenia ----- Message ----- From: Bobbie Stack, RN Sent: 05/31/2022   3:08 PM EST To: Eugenia Mcalpine, LPN Subject: Appt                                           I don't have any idea. I canceled the research visit because her viral load was up.   Maudie Mercury

## 2022-06-06 NOTE — Telephone Encounter (Signed)
Left patient a voice mail to call back to schedule missed appointment, ok to schedule with pharmacy if [facility] has no open slots.

## 2022-06-07 ENCOUNTER — Other Ambulatory Visit (HOSPITAL_COMMUNITY): Payer: Self-pay

## 2022-06-09 ENCOUNTER — Other Ambulatory Visit (HOSPITAL_COMMUNITY): Payer: Self-pay

## 2022-06-10 ENCOUNTER — Other Ambulatory Visit (HOSPITAL_COMMUNITY): Payer: Self-pay

## 2022-06-15 ENCOUNTER — Ambulatory Visit: Payer: Medicaid Other | Admitting: Infectious Disease

## 2022-07-05 ENCOUNTER — Other Ambulatory Visit (HOSPITAL_COMMUNITY): Payer: Self-pay

## 2022-07-07 ENCOUNTER — Other Ambulatory Visit (HOSPITAL_COMMUNITY): Payer: Self-pay

## 2022-07-08 ENCOUNTER — Other Ambulatory Visit (HOSPITAL_COMMUNITY): Payer: Self-pay

## 2022-07-08 ENCOUNTER — Other Ambulatory Visit: Payer: Self-pay

## 2022-07-11 ENCOUNTER — Other Ambulatory Visit (HOSPITAL_COMMUNITY): Payer: Self-pay

## 2022-07-12 ENCOUNTER — Other Ambulatory Visit (HOSPITAL_COMMUNITY): Payer: Self-pay

## 2022-07-22 ENCOUNTER — Other Ambulatory Visit: Payer: Medicaid Other

## 2022-07-22 ENCOUNTER — Other Ambulatory Visit (HOSPITAL_COMMUNITY)
Admission: RE | Admit: 2022-07-22 | Discharge: 2022-07-22 | Disposition: A | Discharge status: Home / Self Care | Payer: Medicaid Other | Source: Ambulatory Visit | Attending: Infectious Disease | Admitting: Infectious Disease

## 2022-07-22 ENCOUNTER — Other Ambulatory Visit: Payer: Self-pay

## 2022-07-22 DIAGNOSIS — Z79899 Other long term (current) drug therapy: Secondary | ICD-10-CM

## 2022-07-22 DIAGNOSIS — B20 Human immunodeficiency virus [HIV] disease: Secondary | ICD-10-CM

## 2022-07-22 DIAGNOSIS — Z113 Encounter for screening for infections with a predominantly sexual mode of transmission: Secondary | ICD-10-CM | POA: Diagnosis not present

## 2022-07-25 LAB — COMPLETE METABOLIC PANEL WITH GFR
AG Ratio: 1.3 (calc) (ref 1.0–2.5)
ALT: 28 U/L (ref 9–46)
AST: 33 U/L (ref 10–40)
Albumin: 4.5 g/dL (ref 3.6–5.1)
Alkaline phosphatase (APISO): 85 U/L (ref 36–130)
BUN: 8 mg/dL (ref 7–25)
CO2: 25 mmol/L (ref 20–32)
Calcium: 9.9 mg/dL (ref 8.6–10.3)
Chloride: 106 mmol/L (ref 98–110)
Creat: 0.92 mg/dL (ref 0.60–1.29)
Globulin: 3.6 g/dL (calc) (ref 1.9–3.7)
Glucose, Bld: 87 mg/dL (ref 65–99)
Potassium: 3.5 mmol/L (ref 3.5–5.3)
Sodium: 142 mmol/L (ref 135–146)
Total Bilirubin: 0.2 mg/dL (ref 0.2–1.2)
Total Protein: 8.1 g/dL (ref 6.1–8.1)
eGFR: 107 mL/min/{1.73_m2} (ref >=60)

## 2022-07-25 LAB — URINE CYTOLOGY ANCILLARY ONLY
Chlamydia: NEGATIVE
Comment: NEGATIVE
Comment: NORMAL
Neisseria Gonorrhea: NEGATIVE

## 2022-07-25 LAB — CBC WITH DIFFERENTIAL/PLATELET
Absolute Monocytes: 620 cells/uL (ref 200–950)
Basophils Absolute: 30 cells/uL (ref 0–200)
Basophils Relative: 0.5 %
Eosinophils Absolute: 18 cells/uL (ref 15–500)
Eosinophils Relative: 0.3 %
HCT: 46.2 % (ref 38.5–50.0)
Hemoglobin: 15.3 g/dL (ref 13.2–17.1)
Lymphs Abs: 2773 cells/uL (ref 850–3900)
MCH: 31.6 pg (ref 27.0–33.0)
MCHC: 33.1 g/dL (ref 32.0–36.0)
MCV: 95.5 fL (ref 80.0–100.0)
MPV: 9.6 fL (ref 7.5–12.5)
Monocytes Relative: 10.5 %
Neutro Abs: 2460 cells/uL (ref 1500–7800)
Neutrophils Relative %: 41.7 %
Platelets: 388 10*3/uL (ref 140–400)
RBC: 4.84 10*6/uL (ref 4.20–5.80)
RDW: 11.7 % (ref 11.0–15.0)
Total Lymphocyte: 47 %
WBC: 5.9 10*3/uL (ref 3.8–10.8)

## 2022-07-25 LAB — T PALLIDUM AB: T Pallidum Abs: POSITIVE — AB

## 2022-07-25 LAB — RPR: RPR Ser Ql: REACTIVE — AB

## 2022-07-25 LAB — HIV-1 RNA QUANT-NO REFLEX-BLD
HIV 1 RNA Quant: <20 Copies/mL — ABNORMAL HIGH
HIV-1 RNA Quant, Log: <1.3 Log cps/mL — ABNORMAL HIGH

## 2022-07-25 LAB — T-HELPER CELLS (CD4) COUNT (NOT AT ARMC)
Absolute CD4: 620 cells/uL (ref 490–1740)
CD4 T Helper %: 23 % — ABNORMAL LOW (ref 30–61)
Total lymphocyte count: 2717 cells/uL (ref 850–3900)

## 2022-07-25 LAB — RPR TITER: RPR Titer: 1:4 {titer} — ABNORMAL HIGH

## 2022-07-28 ENCOUNTER — Other Ambulatory Visit (HOSPITAL_COMMUNITY): Payer: Self-pay

## 2022-08-01 ENCOUNTER — Ambulatory Visit: Payer: Medicaid Other | Admitting: Family

## 2022-08-02 ENCOUNTER — Other Ambulatory Visit (HOSPITAL_COMMUNITY): Payer: Self-pay

## 2022-08-03 ENCOUNTER — Other Ambulatory Visit (HOSPITAL_COMMUNITY): Payer: Self-pay

## 2022-08-05 ENCOUNTER — Ambulatory Visit: Payer: Medicaid Other | Admitting: Family

## 2022-08-09 ENCOUNTER — Ambulatory Visit: Payer: Self-pay

## 2022-08-18 ENCOUNTER — Ambulatory Visit: Payer: Medicaid Other | Admitting: Family

## 2022-08-25 ENCOUNTER — Other Ambulatory Visit (HOSPITAL_COMMUNITY): Payer: Self-pay

## 2022-08-31 ENCOUNTER — Other Ambulatory Visit (HOSPITAL_COMMUNITY): Payer: Self-pay

## 2022-09-27 ENCOUNTER — Other Ambulatory Visit (HOSPITAL_COMMUNITY): Payer: Self-pay

## 2022-10-21 ENCOUNTER — Other Ambulatory Visit (HOSPITAL_COMMUNITY): Payer: Self-pay

## 2022-10-25 ENCOUNTER — Other Ambulatory Visit (HOSPITAL_COMMUNITY): Payer: Self-pay

## 2022-10-26 ENCOUNTER — Other Ambulatory Visit (HOSPITAL_COMMUNITY): Payer: Self-pay

## 2022-11-10 ENCOUNTER — Ambulatory Visit: Payer: Medicaid Other | Admitting: Family

## 2022-11-16 ENCOUNTER — Other Ambulatory Visit: Payer: Self-pay

## 2022-11-21 ENCOUNTER — Other Ambulatory Visit (HOSPITAL_COMMUNITY): Payer: Self-pay

## 2022-11-23 ENCOUNTER — Encounter (HOSPITAL_COMMUNITY): Payer: Self-pay

## 2022-11-23 ENCOUNTER — Other Ambulatory Visit (HOSPITAL_COMMUNITY): Payer: Self-pay

## 2022-11-28 ENCOUNTER — Other Ambulatory Visit (HOSPITAL_COMMUNITY): Payer: Self-pay

## 2022-12-13 ENCOUNTER — Other Ambulatory Visit: Payer: Self-pay

## 2022-12-13 ENCOUNTER — Other Ambulatory Visit (HOSPITAL_COMMUNITY)
Admission: RE | Admit: 2022-12-13 | Discharge: 2022-12-13 | Disposition: A | Discharge status: Home / Self Care | Payer: Medicaid Other | Source: Ambulatory Visit | Attending: Infectious Disease | Admitting: Infectious Disease

## 2022-12-13 ENCOUNTER — Other Ambulatory Visit: Payer: Medicaid Other

## 2022-12-13 DIAGNOSIS — B20 Human immunodeficiency virus [HIV] disease: Secondary | ICD-10-CM

## 2022-12-13 DIAGNOSIS — Z79899 Other long term (current) drug therapy: Secondary | ICD-10-CM

## 2022-12-13 DIAGNOSIS — Z113 Encounter for screening for infections with a predominantly sexual mode of transmission: Secondary | ICD-10-CM | POA: Insufficient documentation

## 2022-12-14 LAB — URINE CYTOLOGY ANCILLARY ONLY
Chlamydia: NEGATIVE
Comment: NEGATIVE
Comment: NORMAL
Neisseria Gonorrhea: NEGATIVE

## 2022-12-14 LAB — T-HELPER CELL (CD4) - (RCID CLINIC ONLY)
CD4 % Helper T Cell: 24 % — ABNORMAL LOW (ref 33–65)
CD4 T Cell Abs: 445 /uL (ref 400–1790)

## 2022-12-15 LAB — COMPLETE METABOLIC PANEL WITH GFR
AG Ratio: 1.5 (calc) (ref 1.0–2.5)
ALT: 51 U/L — ABNORMAL HIGH (ref 9–46)
AST: 76 U/L — ABNORMAL HIGH (ref 10–40)
Albumin: 4.3 g/dL (ref 3.6–5.1)
Alkaline phosphatase (APISO): 77 U/L (ref 36–130)
BUN: 12 mg/dL (ref 7–25)
CO2: 27 mmol/L (ref 20–32)
Calcium: 9.5 mg/dL (ref 8.6–10.3)
Chloride: 109 mmol/L (ref 98–110)
Creat: 1.01 mg/dL (ref 0.60–1.29)
Globulin: 2.9 g/dL (calc) (ref 1.9–3.7)
Glucose, Bld: 91 mg/dL (ref 65–99)
Potassium: 4 mmol/L (ref 3.5–5.3)
Sodium: 142 mmol/L (ref 135–146)
Total Bilirubin: 0.4 mg/dL (ref 0.2–1.2)
Total Protein: 7.2 g/dL (ref 6.1–8.1)
eGFR: 95 mL/min/{1.73_m2} (ref >=60)

## 2022-12-15 LAB — CBC WITH DIFFERENTIAL/PLATELET
Absolute Monocytes: 559 cells/uL (ref 200–950)
Basophils Absolute: 49 cells/uL (ref 0–200)
Basophils Relative: 1 %
Eosinophils Absolute: 69 cells/uL (ref 15–500)
Eosinophils Relative: 1.4 %
HCT: 44.7 % (ref 38.5–50.0)
Hemoglobin: 14.6 g/dL (ref 13.2–17.1)
Lymphs Abs: 2087 cells/uL (ref 850–3900)
MCH: 31.8 pg (ref 27.0–33.0)
MCHC: 32.7 g/dL (ref 32.0–36.0)
MCV: 97.4 fL (ref 80.0–100.0)
MPV: 9.2 fL (ref 7.5–12.5)
Monocytes Relative: 11.4 %
Neutro Abs: 2136 cells/uL (ref 1500–7800)
Neutrophils Relative %: 43.6 %
Platelets: 383 10*3/uL (ref 140–400)
RBC: 4.59 10*6/uL (ref 4.20–5.80)
RDW: 12 % (ref 11.0–15.0)
Total Lymphocyte: 42.6 %
WBC: 4.9 10*3/uL (ref 3.8–10.8)

## 2022-12-15 LAB — LIPID PANEL
Cholesterol: 141 mg/dL (ref <200)
HDL: 49 mg/dL (ref >=40)
LDL Cholesterol (Calc): 67 mg/dL (calc)
Non-HDL Cholesterol (Calc): 92 mg/dL (calc) (ref <130)
Total CHOL/HDL Ratio: 2.9 (calc) (ref <5.0)
Triglycerides: 175 mg/dL — ABNORMAL HIGH (ref <150)

## 2022-12-15 LAB — RPR TITER: RPR Titer: 1:2 {titer} — ABNORMAL HIGH

## 2022-12-15 LAB — HIV-1 RNA QUANT-NO REFLEX-BLD
HIV 1 RNA Quant: 27 Copies/mL — ABNORMAL HIGH
HIV-1 RNA Quant, Log: 1.43 Log cps/mL — ABNORMAL HIGH

## 2022-12-15 LAB — T PALLIDUM AB: T Pallidum Abs: POSITIVE — AB

## 2022-12-15 LAB — RPR: RPR Ser Ql: REACTIVE — AB

## 2022-12-21 ENCOUNTER — Other Ambulatory Visit (HOSPITAL_COMMUNITY): Payer: Self-pay

## 2022-12-28 ENCOUNTER — Other Ambulatory Visit (HOSPITAL_COMMUNITY): Payer: Self-pay

## 2022-12-28 ENCOUNTER — Encounter (HOSPITAL_COMMUNITY): Payer: Self-pay

## 2023-01-03 ENCOUNTER — Other Ambulatory Visit (HOSPITAL_COMMUNITY): Payer: Self-pay

## 2023-01-04 ENCOUNTER — Other Ambulatory Visit: Payer: Self-pay

## 2023-01-04 ENCOUNTER — Emergency Department (HOSPITAL_COMMUNITY)
Admission: EM | Admit: 2023-01-04 | Discharge: 2023-01-04 | Disposition: A | Discharge status: Home / Self Care | Payer: Medicaid Other | Attending: Emergency Medicine | Admitting: Emergency Medicine

## 2023-01-04 ENCOUNTER — Encounter (HOSPITAL_COMMUNITY): Payer: Self-pay | Admitting: Emergency Medicine

## 2023-01-04 ENCOUNTER — Emergency Department (HOSPITAL_COMMUNITY): Payer: Medicaid Other

## 2023-01-04 DIAGNOSIS — W19XXXA Unspecified fall, initial encounter: Secondary | ICD-10-CM | POA: Diagnosis not present

## 2023-01-04 DIAGNOSIS — Z79899 Other long term (current) drug therapy: Secondary | ICD-10-CM | POA: Diagnosis not present

## 2023-01-04 DIAGNOSIS — S8991XA Unspecified injury of right lower leg, initial encounter: Secondary | ICD-10-CM | POA: Diagnosis present

## 2023-01-04 DIAGNOSIS — S92014A Nondisplaced fracture of body of right calcaneus, initial encounter for closed fracture: Secondary | ICD-10-CM | POA: Insufficient documentation

## 2023-01-04 DIAGNOSIS — S92001A Unspecified fracture of right calcaneus, initial encounter for closed fracture: Secondary | ICD-10-CM | POA: Diagnosis not present

## 2023-01-04 MED ORDER — OXYCODONE-ACETAMINOPHEN 5-325 MG PO TABS: 1.0000 | ORAL_TABLET | Freq: Four times a day (QID) | ORAL | 0 refills | Status: AC | PRN

## 2023-01-04 MED ORDER — OXYCODONE-ACETAMINOPHEN 5-325 MG PO TABS
1.0000 | ORAL_TABLET | Freq: Once | ORAL | Status: AC
  Administered 2023-01-04: 1 via ORAL
  Filled 2023-01-04: qty 1

## 2023-01-04 NOTE — Discharge Instructions (Addendum)
As we discussed, your right heel is broken.  We have placed you in a splint that you need to wear at all times for management of this fracture.  Do not remove it, and please make sure that you keep it dry.  You can place a garbage bag over your foot to take a shower to ensure that it stays dry.  You should also not under any circumstances walk on this splint.  Please use the crutches at all times.  You will need to follow-up with orthopedics for definitive management of this.  I have given you a referral with a number to call to schedule an appointment for follow-up.  Please call at your earliest convenience.  I have also given you a prescription for Percocet which is a narcotic for you to take as prescribed as needed for severe pain only.  Do not drive or operate machinery while taking this medication as it can be sedating.  You may also take Tylenol and ibuprofen as needed for additional pain.  Return if development of any new or worsening symptoms.
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
## 2023-01-04 NOTE — ED Notes (Signed)
The pt haspainful swollen rt foot

## 2023-01-04 NOTE — Progress Notes (Signed)
Orthopedic Tech Progress Note Patient Details:  Stephen Griffin July 11, 1980 [id number]  Ortho Devices Type of Ortho Device: Short leg splint, Crutches Ortho Device/Splint Location: RLE Ortho Device/Splint Interventions: Application   Post Interventions Patient Tolerated: Well Instructions Provided: Poper ambulation with device  Vineet Kinney E Sally Menard 01/04/2023, 8:37 PM

## 2023-01-04 NOTE — ED Triage Notes (Signed)
Pt with right foot injury from fall earlier today. Swelling noted.

## 2023-01-12 ENCOUNTER — Ambulatory Visit
Admission: RE | Admit: 2023-01-12 | Discharge: 2023-01-12 | Disposition: A | Discharge status: Home / Self Care | Payer: Medicaid Other | Source: Ambulatory Visit | Attending: Orthopaedic Surgery | Admitting: Orthopaedic Surgery

## 2023-01-12 ENCOUNTER — Other Ambulatory Visit: Payer: Self-pay | Admitting: Orthopaedic Surgery

## 2023-01-12 DIAGNOSIS — R600 Localized edema: Secondary | ICD-10-CM | POA: Diagnosis not present

## 2023-01-12 DIAGNOSIS — M79671 Pain in right foot: Secondary | ICD-10-CM

## 2023-01-12 DIAGNOSIS — S92001A Unspecified fracture of right calcaneus, initial encounter for closed fracture: Secondary | ICD-10-CM | POA: Diagnosis not present
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
## 2023-01-16 ENCOUNTER — Ambulatory Visit: Payer: Medicaid Other | Admitting: Infectious Disease

## 2023-01-23 ENCOUNTER — Other Ambulatory Visit (HOSPITAL_COMMUNITY): Payer: Self-pay

## 2023-01-23 ENCOUNTER — Ambulatory Visit: Payer: Medicaid Other | Admitting: Infectious Disease

## 2023-01-25 ENCOUNTER — Other Ambulatory Visit: Payer: Self-pay

## 2023-01-31 ENCOUNTER — Encounter (HOSPITAL_COMMUNITY): Payer: Self-pay

## 2023-01-31 ENCOUNTER — Other Ambulatory Visit (HOSPITAL_COMMUNITY): Payer: Self-pay

## 2023-01-31 DIAGNOSIS — S92001A Unspecified fracture of right calcaneus, initial encounter for closed fracture: Secondary | ICD-10-CM | POA: Diagnosis not present

## 2023-02-03 ENCOUNTER — Other Ambulatory Visit (HOSPITAL_COMMUNITY): Payer: Self-pay

## 2023-02-08 ENCOUNTER — Other Ambulatory Visit: Payer: Self-pay | Admitting: Pharmacist

## 2023-02-08 ENCOUNTER — Other Ambulatory Visit (HOSPITAL_COMMUNITY): Payer: Self-pay

## 2023-02-08 ENCOUNTER — Other Ambulatory Visit: Payer: Self-pay

## 2023-02-08 DIAGNOSIS — B20 Human immunodeficiency virus [HIV] disease: Secondary | ICD-10-CM

## 2023-02-08 MED ORDER — DOLUTEGRAVIR SODIUM 50 MG PO TABS
50.0000 mg | ORAL_TABLET | Freq: Every day | ORAL | 0 refills | Status: DC
Start: 2023-02-08 — End: 2023-02-20
  Filled 2023-02-08 – 2023-02-15 (×2): qty 30, 30d supply, fill #0

## 2023-02-08 MED ORDER — SYMTUZA 800-150-200-10 MG PO TABS
1.0000 | ORAL_TABLET | Freq: Every day | ORAL | 0 refills | Status: DC
  Filled 2023-02-08 – 2023-02-15 (×2): qty 30, 30d supply, fill #0

## 2023-02-08 NOTE — Progress Notes (Signed)
Patient called front desk stating she is out of medication. Has an appointment on 9/23 with Dr. Daiva Eves and has no further refills. Sending in 30-day scripts to bridge through appointment. No further refills until seen in clinic.  Margarite Gouge, PharmD, CPP, BCIDP, AAHIVP Clinical Pharmacist Practitioner Infectious Diseases Clinical Pharmacist [facility]

## 2023-02-15 ENCOUNTER — Other Ambulatory Visit: Payer: Self-pay

## 2023-02-15 ENCOUNTER — Other Ambulatory Visit (HOSPITAL_COMMUNITY): Payer: Self-pay

## 2023-02-15 ENCOUNTER — Other Ambulatory Visit: Payer: Self-pay | Admitting: Infectious Disease

## 2023-02-20 ENCOUNTER — Encounter: Payer: Self-pay | Admitting: Infectious Disease

## 2023-02-20 ENCOUNTER — Other Ambulatory Visit (HOSPITAL_COMMUNITY): Payer: Self-pay

## 2023-02-20 ENCOUNTER — Other Ambulatory Visit (HOSPITAL_COMMUNITY)
Admission: RE | Admit: 2023-02-20 | Discharge: 2023-02-20 | Disposition: A | Discharge status: Home / Self Care | Payer: Medicaid Other | Source: Ambulatory Visit | Attending: Infectious Disease | Admitting: Infectious Disease

## 2023-02-20 ENCOUNTER — Other Ambulatory Visit: Payer: Self-pay

## 2023-02-20 ENCOUNTER — Ambulatory Visit (INDEPENDENT_AMBULATORY_CARE_PROVIDER_SITE_OTHER): Payer: Medicaid Other | Admitting: Infectious Disease

## 2023-02-20 VITALS — BP 137/87 | HR 79 | Resp 16 | Ht 71.0 in | Wt 159.0 lb

## 2023-02-20 DIAGNOSIS — B20 Human immunodeficiency virus [HIV] disease: Secondary | ICD-10-CM | POA: Insufficient documentation

## 2023-02-20 DIAGNOSIS — S92001A Unspecified fracture of right calcaneus, initial encounter for closed fracture: Secondary | ICD-10-CM

## 2023-02-20 DIAGNOSIS — Z113 Encounter for screening for infections with a predominantly sexual mode of transmission: Secondary | ICD-10-CM

## 2023-02-20 DIAGNOSIS — Z7185 Encounter for immunization safety counseling: Secondary | ICD-10-CM | POA: Diagnosis not present

## 2023-02-20 DIAGNOSIS — Z789 Other specified health status: Secondary | ICD-10-CM | POA: Diagnosis not present

## 2023-02-20 DIAGNOSIS — Z23 Encounter for immunization: Secondary | ICD-10-CM

## 2023-02-20 LAB — CBC WITH DIFFERENTIAL/PLATELET
Absolute Monocytes: 661 cells/uL (ref 200–950)
Basophils Absolute: 41 cells/uL (ref 0–200)
Basophils Relative: 0.7 %
Eosinophils Absolute: 122 cells/uL (ref 15–500)
Eosinophils Relative: 2.1 %
HCT: 48.7 % (ref 38.5–50.0)
Hemoglobin: 15.4 g/dL (ref 13.2–17.1)
Lymphs Abs: 2314 cells/uL (ref 850–3900)
MCH: 31.2 pg (ref 27.0–33.0)
MCHC: 31.6 g/dL — ABNORMAL LOW (ref 32.0–36.0)
MCV: 98.6 fL (ref 80.0–100.0)
MPV: 9.7 fL (ref 7.5–12.5)
Monocytes Relative: 11.4 %
Neutro Abs: 2662 cells/uL (ref 1500–7800)
Neutrophils Relative %: 45.9 %
Platelets: 439 10*3/uL — ABNORMAL HIGH (ref 140–400)
RBC: 4.94 10*6/uL (ref 4.20–5.80)
RDW: 11.2 % (ref 11.0–15.0)
Total Lymphocyte: 39.9 %
WBC: 5.8 10*3/uL (ref 3.8–10.8)

## 2023-02-20 MED ORDER — DOXYCYCLINE HYCLATE 100 MG PO TABS
ORAL_TABLET | ORAL | 6 refills | Status: AC
  Filled 2023-02-20: qty 60, 30d supply, fill #0

## 2023-02-20 MED ORDER — SYMTUZA 800-150-200-10 MG PO TABS
1.0000 | ORAL_TABLET | Freq: Every day | ORAL | 0 refills | Status: DC
Start: 2023-02-20 — End: 2023-03-30
  Filled 2023-02-20 – 2023-03-08 (×2): qty 30, 30d supply, fill #0

## 2023-02-20 MED ORDER — DOLUTEGRAVIR SODIUM 50 MG PO TABS
50.0000 mg | ORAL_TABLET | Freq: Every day | ORAL | 0 refills | Status: DC
Start: 2023-02-20 — End: 2023-03-30
  Filled 2023-02-20 – 2023-03-08 (×2): qty 30, 30d supply, fill #0
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
## 2023-02-21 ENCOUNTER — Telehealth: Payer: Self-pay

## 2023-02-21 LAB — CYTOLOGY, (ORAL, ANAL, URETHRAL) ANCILLARY ONLY
Chlamydia: NEGATIVE
Chlamydia: NEGATIVE
Comment: NEGATIVE
Comment: NEGATIVE
Comment: NORMAL
Comment: NORMAL
Neisseria Gonorrhea: NEGATIVE
Neisseria Gonorrhea: POSITIVE — AB

## 2023-02-21 LAB — RPR: RPR Ser Ql: REACTIVE — AB

## 2023-02-21 LAB — LIPID PANEL
Cholesterol: 151 mg/dL (ref <200)
HDL: 46 mg/dL (ref >=40)
LDL Cholesterol (Calc): 80 mg/dL (calc)
Non-HDL Cholesterol (Calc): 105 mg/dL (calc) (ref <130)
Total CHOL/HDL Ratio: 3.3 (calc) (ref <5.0)
Triglycerides: 151 mg/dL — ABNORMAL HIGH (ref <150)

## 2023-02-21 LAB — COMPLETE METABOLIC PANEL WITH GFR
AG Ratio: 1.2 (calc) (ref 1.0–2.5)
ALT: 27 U/L (ref 9–46)
AST: 29 U/L (ref 10–40)
Albumin: 4.4 g/dL (ref 3.6–5.1)
Alkaline phosphatase (APISO): 95 U/L (ref 36–130)
BUN: 10 mg/dL (ref 7–25)
CO2: 27 mmol/L (ref 20–32)
Calcium: 9.9 mg/dL (ref 8.6–10.3)
Chloride: 104 mmol/L (ref 98–110)
Creat: 0.94 mg/dL (ref 0.60–1.29)
Globulin: 3.7 g/dL (calc) (ref 1.9–3.7)
Glucose, Bld: 78 mg/dL (ref 65–99)
Potassium: 3.9 mmol/L (ref 3.5–5.3)
Sodium: 141 mmol/L (ref 135–146)
Total Bilirubin: 0.6 mg/dL (ref 0.2–1.2)
Total Protein: 8.1 g/dL (ref 6.1–8.1)
eGFR: 104 mL/min/{1.73_m2} (ref >=60)

## 2023-02-21 LAB — URINE CYTOLOGY ANCILLARY ONLY
Chlamydia: NEGATIVE
Comment: NEGATIVE
Comment: NORMAL
Neisseria Gonorrhea: NEGATIVE

## 2023-02-21 LAB — RPR TITER: RPR Titer: 1:8 {titer} — ABNORMAL HIGH

## 2023-02-21 LAB — T-HELPER CELLS (CD4) COUNT (NOT AT ARMC)
CD4 % Helper T Cell: 30 % — ABNORMAL LOW (ref 33–65)
CD4 T Cell Abs: 536 /uL (ref 400–1790)

## 2023-02-21 NOTE — Telephone Encounter (Signed)
Medicaid Managed Care   Unsuccessful Outreach Note  02/21/2023 Name: Stephen Griffin MRN: [id number] DOB: April 10, 1981  Referred by: Pcp, No Reason for referral : No chief complaint on file.   An unsuccessful telephone outreach was attempted today. The patient was referred to the case management team for assistance with care management and care coordination.   Follow Up Plan: If patient returns call to provider office, please advise to call Embedded Care Management Care Guide Nicholes Rough* at [phone]*  Nicholes Rough, CMA Care Guide [employer]

## 2023-02-21 NOTE — Telephone Encounter (Signed)
Called patient to go over results. Did not have any questions at this time. Is schedule for nurse visit this Thursday. Denies allergies to Rocephin.  Juanita Laster, RMA

## 2023-02-21 NOTE — Telephone Encounter (Signed)
-----   Message from [place] sent at 02/21/2023  2:26 PM EDT ----- Regarding: FW: Chocolate needs Rocephin 500 mg IM once ----- Message ----- From: Janace Hoard Lab Results In Sent: 02/20/2023  11:00 PM EDT To: Randall Hiss, MD

## 2023-02-22 LAB — T PALLIDUM AB: T Pallidum Abs: POSITIVE — AB

## 2023-02-23 ENCOUNTER — Ambulatory Visit (INDEPENDENT_AMBULATORY_CARE_PROVIDER_SITE_OTHER): Payer: Medicaid Other

## 2023-02-23 ENCOUNTER — Other Ambulatory Visit: Payer: Self-pay

## 2023-02-23 DIAGNOSIS — A549 Gonococcal infection, unspecified: Secondary | ICD-10-CM | POA: Diagnosis not present

## 2023-02-23 MED ORDER — CEFTRIAXONE SODIUM 500 MG IJ SOLR
500.0000 mg | Freq: Once | INTRAMUSCULAR | Status: AC
Start: 2023-02-23 — End: 2023-02-23
  Administered 2023-02-23: 500 mg via INTRAMUSCULAR

## 2023-02-23 NOTE — Progress Notes (Signed)
[facility] Nurse Visit  EURIAH BARRICK Sep 05, 1980  STI Treatment:   Neisseria Gonorrhea positive on 02/20/23; Here for treatment today.   ----- Message from Paulette Blanch [place] sent at 02/21/2023  2:26 PM EDT ----- Regarding: FW: Chocolate needs Rocephin 500 mg IM once ----- Message ----- From: Janace Hoard Lab Results In Sent: 02/20/2023  11:00 PM EDT To: Randall Hiss, MD    02/20/2023  Neisseria Gonorrhea Negative   Neisseria Gonorrhea Negative   Neisseria Gonorrhea Positive !   Chlamydia Negative   Chlamydia Negative   Chlamydia Negative   Comment Normal Reference Ranger Chlamydia - Negative   Comment Normal Reference Range Neisseria Gonorrhea - Negative   Comment Normal Reference Ranger Chlamydia - Negative   Comment Normal Reference Range Neisseria Gonorrhea - Negative   Comment Normal Reference Ranger Chlamydia - Negative   Comment Normal Reference Range Neisseria Gonorrhea - Negative     Legend: ! Abnormal  Other  1: RECTAL SWAB     Collected: 02/20/2023 11:04 AM       No Known Allergies   Reviewed allergies with patient.   Medications administered: ceftriaxone 500 mg IM  once   Immunizations administered: none; declined Covid vaccine Left upper quad. Gluteus. Patient tolerated well.   Staff reminded patient of next visit dated 03/01/2023 with [facility] research. Staff offered condoms, advised patient to remain abstinent for 7-10 days after treatment.    RXSP PATIENT QUESTIONS: Patient did not have any additional questions or concerns.      Valarie Cones, LPN

## 2023-02-24 LAB — HIV RNA, RTPCR W/R GT (RTI, PI,INT)
HIV 1 RNA Quant: NOT DETECTED {copies}/mL
HIV-1 RNA Quant, Log: NOT DETECTED {Log}

## 2023-02-24 LAB — CYTOLOGY - PAP
Comment: NEGATIVE
High risk HPV: POSITIVE — AB

## 2023-02-25 ENCOUNTER — Encounter: Payer: Self-pay | Admitting: Infectious Disease

## 2023-02-25 ENCOUNTER — Other Ambulatory Visit: Payer: Self-pay | Admitting: Infectious Disease

## 2023-02-25 DIAGNOSIS — B20 Human immunodeficiency virus [HIV] disease: Secondary | ICD-10-CM

## 2023-02-25 DIAGNOSIS — R85612 Low grade squamous intraepithelial lesion on cytologic smear of anus (LGSIL): Secondary | ICD-10-CM

## 2023-02-25 HISTORY — DX: Low grade squamous intraepithelial lesion on cytologic smear of anus (LGSIL): R85.612

## 2023-02-27 ENCOUNTER — Telehealth: Payer: Self-pay

## 2023-02-27 NOTE — Telephone Encounter (Signed)
-----   Message from [place] sent at 02/25/2023  2:09 PM EDT ----- LSIL found referred to [facility] ----- Message ----- From: Janace Hoard Lab Results In Sent: 02/20/2023  11:00 PM EDT To: Randall Hiss, MD

## 2023-02-27 NOTE — Telephone Encounter (Signed)
Attempted to call patient regarding lab results. Not able to reach her at this time. Voicemail message is for "KIKI" did not leave voicemail. Juanita Laster, RMA

## 2023-02-28 NOTE — Telephone Encounter (Signed)
Spoke with Chocolate, discussed anal pap results and referral to [facility]. Patient verbalized understanding and has no further questions.   Sandie Ano, RN

## 2023-03-01 ENCOUNTER — Encounter: Payer: Medicaid Other | Admitting: *Deleted

## 2023-03-08 ENCOUNTER — Other Ambulatory Visit: Payer: Self-pay

## 2023-03-08 NOTE — Progress Notes (Signed)
Specialty Pharmacy Refill Coordination Note  Stephen Griffin is a 42 y.o. adult contacted today regarding refills of specialty medication(s) Darun-Cobic-Emtricit-Tenofaf; Dolutegravir Sodium   Patient requested Delivery   Delivery date: 03/14/23   Verified address: [address] [name] [address]   Medication will be filled on 03/13/23.

## 2023-03-15 ENCOUNTER — Telehealth: Payer: Self-pay

## 2023-03-15 NOTE — Telephone Encounter (Signed)
Per Dr.Van Dam patient needs syhpillis tx - bicillin 2.4 mu injection x 1.  Patient scheduled for nurse visit 10/17.  Emmakate Hypes Lesli Albee, CMA

## 2023-03-16 ENCOUNTER — Ambulatory Visit: Payer: Medicaid Other

## 2023-03-16 DIAGNOSIS — S92001A Unspecified fracture of right calcaneus, initial encounter for closed fracture: Secondary | ICD-10-CM | POA: Diagnosis not present

## 2023-03-17 ENCOUNTER — Ambulatory Visit (INDEPENDENT_AMBULATORY_CARE_PROVIDER_SITE_OTHER): Payer: Medicaid Other

## 2023-03-17 ENCOUNTER — Other Ambulatory Visit: Payer: Self-pay

## 2023-03-17 DIAGNOSIS — A539 Syphilis, unspecified: Secondary | ICD-10-CM | POA: Diagnosis not present

## 2023-03-17 MED ORDER — PENICILLIN G BENZATHINE 1200000 UNIT/2ML IM SUSY
1.2000 10*6.[IU] | PREFILLED_SYRINGE | Freq: Once | INTRAMUSCULAR | Status: AC
Start: 2023-03-17 — End: 2023-03-17
  Administered 2023-03-17: 1.2 10*6.[IU] via INTRAMUSCULAR

## 2023-03-17 NOTE — Progress Notes (Signed)
Patient in office today for syphilis treatment.  Bicillin 2.4 MU x 1. Patient tolerated injection well.  Patient given condoms today as well. Davis Vannatter Jonathon Resides, CMA

## 2023-03-30 ENCOUNTER — Other Ambulatory Visit: Payer: Self-pay | Admitting: Infectious Disease

## 2023-03-30 ENCOUNTER — Other Ambulatory Visit: Payer: Self-pay

## 2023-03-30 ENCOUNTER — Other Ambulatory Visit (HOSPITAL_COMMUNITY): Payer: Self-pay

## 2023-03-30 DIAGNOSIS — B20 Human immunodeficiency virus [HIV] disease: Secondary | ICD-10-CM

## 2023-03-30 MED ORDER — SYMTUZA 800-150-200-10 MG PO TABS
1.0000 | ORAL_TABLET | Freq: Every day | ORAL | 1 refills | Status: DC
Start: 2023-03-30 — End: 2023-05-02
  Filled 2023-03-30: qty 30, 30d supply, fill #0

## 2023-03-30 MED ORDER — TIVICAY 50 MG PO TABS
50.0000 mg | ORAL_TABLET | Freq: Every day | ORAL | 1 refills | Status: DC
  Filled 2023-03-30: qty 30, 30d supply, fill #0

## 2023-03-30 NOTE — Progress Notes (Signed)
Specialty Pharmacy Refill Coordination Note  Stephen Griffin is a 42 y.o. adult contacted today regarding refills of specialty medication(s) Darun-Cobic-Emtricit-Tenofaf; Dolutegravir Sodium   Patient requested Delivery   Delivery date: 04/10/23   Verified address: [address] [name] [address]   Medication will be filled on 04/07/23.  Refill request pending. Notify if delayed.

## 2023-04-07 ENCOUNTER — Other Ambulatory Visit (HOSPITAL_COMMUNITY): Payer: Self-pay

## 2023-04-13 DIAGNOSIS — S92001A Unspecified fracture of right calcaneus, initial encounter for closed fracture: Secondary | ICD-10-CM | POA: Diagnosis not present

## 2023-04-17 ENCOUNTER — Other Ambulatory Visit: Payer: Medicaid Other

## 2023-04-17 ENCOUNTER — Other Ambulatory Visit (HOSPITAL_COMMUNITY)
Admission: RE | Admit: 2023-04-17 | Discharge: 2023-04-17 | Disposition: A | Discharge status: Home / Self Care | Payer: Medicaid Other | Source: Ambulatory Visit | Attending: Infectious Disease | Admitting: Infectious Disease

## 2023-04-17 ENCOUNTER — Other Ambulatory Visit: Payer: Self-pay

## 2023-04-17 DIAGNOSIS — Z113 Encounter for screening for infections with a predominantly sexual mode of transmission: Secondary | ICD-10-CM

## 2023-04-17 DIAGNOSIS — B20 Human immunodeficiency virus [HIV] disease: Secondary | ICD-10-CM

## 2023-04-18 LAB — URINE CYTOLOGY ANCILLARY ONLY
Chlamydia: NEGATIVE
Comment: NEGATIVE
Comment: NORMAL
Neisseria Gonorrhea: NEGATIVE

## 2023-04-19 LAB — T-HELPER CELL (CD4) - (RCID CLINIC ONLY)
CD4 % Helper T Cell: 31 % — ABNORMAL LOW (ref 33–65)
CD4 T Cell Abs: 481 /uL (ref 400–1790)

## 2023-04-20 LAB — CBC WITH DIFFERENTIAL/PLATELET
Absolute Lymphocytes: 1764 {cells}/uL (ref 850–3900)
Absolute Monocytes: 691 {cells}/uL (ref 200–950)
Basophils Absolute: 43 {cells}/uL (ref 0–200)
Basophils Relative: 0.6 %
Eosinophils Absolute: 50 {cells}/uL (ref 15–500)
Eosinophils Relative: 0.7 %
HCT: 47.6 % (ref 38.5–50.0)
Hemoglobin: 15.4 g/dL (ref 13.2–17.1)
MCH: 31.8 pg (ref 27.0–33.0)
MCHC: 32.4 g/dL (ref 32.0–36.0)
MCV: 98.3 fL (ref 80.0–100.0)
MPV: 9.1 fL (ref 7.5–12.5)
Monocytes Relative: 9.6 %
Neutro Abs: 4651 {cells}/uL (ref 1500–7800)
Neutrophils Relative %: 64.6 %
Platelets: 379 10*3/uL (ref 140–400)
RBC: 4.84 10*6/uL (ref 4.20–5.80)
RDW: 11.5 % (ref 11.0–15.0)
Total Lymphocyte: 24.5 %
WBC: 7.2 10*3/uL (ref 3.8–10.8)

## 2023-04-20 LAB — COMPLETE METABOLIC PANEL WITH GFR
AG Ratio: 1.2 (calc) (ref 1.0–2.5)
ALT: 54 U/L — ABNORMAL HIGH (ref 9–46)
AST: 139 U/L — ABNORMAL HIGH (ref 10–40)
Albumin: 4.2 g/dL (ref 3.6–5.1)
Alkaline phosphatase (APISO): 125 U/L (ref 36–130)
BUN: 8 mg/dL (ref 7–25)
CO2: 25 mmol/L (ref 20–32)
Calcium: 9.4 mg/dL (ref 8.6–10.3)
Chloride: 109 mmol/L (ref 98–110)
Creat: 0.91 mg/dL (ref 0.60–1.29)
Globulin: 3.5 g/dL (ref 1.9–3.7)
Glucose, Bld: 82 mg/dL (ref 65–99)
Potassium: 3.5 mmol/L (ref 3.5–5.3)
Sodium: 143 mmol/L (ref 135–146)
Total Bilirubin: 0.3 mg/dL (ref 0.2–1.2)
Total Protein: 7.7 g/dL (ref 6.1–8.1)
eGFR: 108 mL/min/{1.73_m2} (ref >=60)

## 2023-04-20 LAB — HIV-1 RNA QUANT-NO REFLEX-BLD
HIV 1 RNA Quant: NOT DETECTED {copies}/mL
HIV-1 RNA Quant, Log: NOT DETECTED {Log_copies}/mL

## 2023-04-30 ENCOUNTER — Encounter: Payer: Self-pay | Admitting: Infectious Disease

## 2023-04-30 DIAGNOSIS — A529 Late syphilis, unspecified: Secondary | ICD-10-CM | POA: Insufficient documentation

## 2023-04-30 DIAGNOSIS — A539 Syphilis, unspecified: Secondary | ICD-10-CM | POA: Insufficient documentation

## 2023-04-30 HISTORY — DX: Late syphilis, unspecified: A52.9
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
## 2023-05-01 ENCOUNTER — Encounter: Payer: Self-pay | Admitting: Infectious Disease

## 2023-05-01 DIAGNOSIS — A529 Late syphilis, unspecified: Secondary | ICD-10-CM

## 2023-05-01 DIAGNOSIS — Z789 Other specified health status: Secondary | ICD-10-CM

## 2023-05-01 DIAGNOSIS — E782 Mixed hyperlipidemia: Secondary | ICD-10-CM

## 2023-05-01 DIAGNOSIS — R85612 Low grade squamous intraepithelial lesion on cytologic smear of anus (LGSIL): Secondary | ICD-10-CM

## 2023-05-01 DIAGNOSIS — H35033 Hypertensive retinopathy, bilateral: Secondary | ICD-10-CM

## 2023-05-01 DIAGNOSIS — B20 Human immunodeficiency virus [HIV] disease: Secondary | ICD-10-CM
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
## 2023-05-03 ENCOUNTER — Encounter: Payer: Self-pay | Admitting: Infectious Disease

## 2023-05-03 ENCOUNTER — Other Ambulatory Visit: Payer: Self-pay

## 2023-05-03 ENCOUNTER — Ambulatory Visit (INDEPENDENT_AMBULATORY_CARE_PROVIDER_SITE_OTHER): Payer: Medicaid Other | Admitting: Infectious Disease

## 2023-05-03 ENCOUNTER — Other Ambulatory Visit (HOSPITAL_COMMUNITY): Payer: Self-pay

## 2023-05-03 ENCOUNTER — Other Ambulatory Visit (HOSPITAL_COMMUNITY)
Admission: RE | Admit: 2023-05-03 | Discharge: 2023-05-03 | Disposition: A | Discharge status: Home / Self Care | Payer: Medicaid Other | Source: Ambulatory Visit | Attending: Infectious Disease | Admitting: Infectious Disease

## 2023-05-03 VITALS — BP 136/95 | HR 103 | Temp 98.2°F | Wt 163.0 lb

## 2023-05-03 DIAGNOSIS — H3322 Serous retinal detachment, left eye: Secondary | ICD-10-CM

## 2023-05-03 DIAGNOSIS — E782 Mixed hyperlipidemia: Secondary | ICD-10-CM

## 2023-05-03 DIAGNOSIS — B20 Human immunodeficiency virus [HIV] disease: Secondary | ICD-10-CM | POA: Insufficient documentation

## 2023-05-03 DIAGNOSIS — Z23 Encounter for immunization: Secondary | ICD-10-CM

## 2023-05-03 DIAGNOSIS — H35033 Hypertensive retinopathy, bilateral: Secondary | ICD-10-CM

## 2023-05-03 DIAGNOSIS — R85612 Low grade squamous intraepithelial lesion on cytologic smear of anus (LGSIL): Secondary | ICD-10-CM | POA: Diagnosis not present

## 2023-05-03 DIAGNOSIS — Z789 Other specified health status: Secondary | ICD-10-CM | POA: Diagnosis not present

## 2023-05-03 DIAGNOSIS — I10 Essential (primary) hypertension: Secondary | ICD-10-CM | POA: Diagnosis not present

## 2023-05-03 MED ORDER — ESTRADIOL VALERATE 20 MG/ML IM OIL
TOPICAL_OIL | INTRAMUSCULAR | 6 refills | Status: DC
  Filled 2023-05-03: qty 5, 28d supply, fill #0
  Filled 2023-06-14: qty 5, 28d supply, fill #1
  Filled 2023-09-11: qty 5, 28d supply, fill #2

## 2023-05-03 MED ORDER — ROSUVASTATIN CALCIUM 20 MG PO TABS
20.0000 mg | ORAL_TABLET | Freq: Every day | ORAL | 11 refills | Status: DC
  Filled 2023-05-03: qty 30, 30d supply, fill #0
  Filled 2023-06-14: qty 30, 30d supply, fill #1
  Filled 2023-09-11: qty 30, 30d supply, fill #2

## 2023-05-03 MED ORDER — SYMTUZA 800-150-200-10 MG PO TABS
1.0000 | ORAL_TABLET | Freq: Every day | ORAL | 11 refills | Status: DC
  Filled 2023-05-03 (×2): qty 30, 30d supply, fill #0
  Filled 2023-05-22: qty 30, 30d supply, fill #1
  Filled 2023-06-14: qty 30, 30d supply, fill #2
  Filled 2023-07-11: qty 30, 30d supply, fill #3
  Filled 2023-08-08: qty 30, 30d supply, fill #4
  Filled 2023-09-11: qty 30, 30d supply, fill #5

## 2023-05-03 MED ORDER — TIVICAY 50 MG PO TABS
50.0000 mg | ORAL_TABLET | Freq: Every day | ORAL | 1 refills | Status: DC
Start: 2023-05-03 — End: 2023-06-14
  Filled 2023-05-03 (×2): qty 30, 30d supply, fill #0
  Filled 2023-05-22: qty 30, 30d supply, fill #1

## 2023-05-03 MED ORDER — SPIRONOLACTONE 100 MG PO TABS
100.0000 mg | ORAL_TABLET | Freq: Every day | ORAL | 11 refills | Status: DC
  Filled 2023-05-03: qty 30, 30d supply, fill #0
  Filled 2023-06-14: qty 30, 30d supply, fill #1
  Filled 2023-09-11: qty 30, 30d supply, fill #2

## 2023-05-03 MED ORDER — SPIRONOLACTONE 50 MG PO TABS
50.0000 mg | ORAL_TABLET | Freq: Every day | ORAL | 11 refills | Status: DC
  Filled 2023-05-03: qty 30, 30d supply, fill #0
  Filled 2023-06-14: qty 30, 30d supply, fill #1

## 2023-05-03 NOTE — Progress Notes (Signed)
Specialty Pharmacy Refill Coordination Note  Stephen Griffin is a 42 y.o. adult contacted today regarding refills of specialty medication(s) Darun-Cobic-Emtricit-Tenofaf; Dolutegravir Sodium   Patient requested Delivery   Delivery date: 05/05/23   Verified address: [address] [name] [address]   Medication will be filled on 05/04/23.

## 2023-05-04 ENCOUNTER — Other Ambulatory Visit: Payer: Self-pay

## 2023-05-05 LAB — CYTOLOGY, (ORAL, ANAL, URETHRAL) ANCILLARY ONLY
Chlamydia: NEGATIVE
Chlamydia: NEGATIVE
Comment: NEGATIVE
Comment: NEGATIVE
Comment: NORMAL
Comment: NORMAL
Neisseria Gonorrhea: NEGATIVE
Neisseria Gonorrhea: NEGATIVE

## 2023-05-22 ENCOUNTER — Other Ambulatory Visit: Payer: Self-pay

## 2023-05-22 NOTE — Progress Notes (Signed)
Specialty Pharmacy Refill Coordination Note  Stephen Griffin is a 42 y.o. adult contacted today regarding refills of specialty medication(s) Darun-Cobic-Emtricit-TenofAF (Symtuza); Dolutegravir Sodium (Tivicay)   Patient requested Delivery   Delivery date: 05/30/23   Verified address: [address]   Medication will be filled on 05/29/23.

## 2023-05-29 ENCOUNTER — Other Ambulatory Visit: Payer: Self-pay

## 2023-06-08 ENCOUNTER — Other Ambulatory Visit (HOSPITAL_COMMUNITY): Payer: Self-pay

## 2023-06-14 ENCOUNTER — Other Ambulatory Visit: Payer: Self-pay

## 2023-06-14 ENCOUNTER — Other Ambulatory Visit: Payer: Self-pay | Admitting: Infectious Disease

## 2023-06-14 DIAGNOSIS — B20 Human immunodeficiency virus [HIV] disease: Secondary | ICD-10-CM

## 2023-06-14 MED ORDER — TIVICAY 50 MG PO TABS
50.0000 mg | ORAL_TABLET | Freq: Every day | ORAL | 3 refills | Status: DC
  Filled 2023-06-14: qty 30, 30d supply, fill #0
  Filled 2023-07-11: qty 30, 30d supply, fill #1
  Filled 2023-08-08: qty 30, 30d supply, fill #2
  Filled 2023-09-11: qty 30, 30d supply, fill #3

## 2023-06-14 NOTE — Progress Notes (Signed)
 Specialty Pharmacy Refill Coordination Note  Stephen Griffin is a 43 y.o. adult contacted today regarding refills of specialty medication(s) Darun-Cobic-Emtricit-TenofAF (Symtuza ); Dolutegravir  Sodium (Tivicay )   Patient requested Delivery   Delivery date: 06/23/23   Verified address: [address]   Medication will be filled on 06/22/23.

## 2023-06-27 ENCOUNTER — Telehealth: Payer: Self-pay

## 2023-06-27 NOTE — Telephone Encounter (Signed)
Spoke with Chocolate to discuss anal pap results and emphasize importance of referral to general surgery. Provided her with [facility] phone number and encouraged her to call and make an appointment.   Patient verbalized understanding and has no further questions.   Sandie Ano, RN

## 2023-07-11 ENCOUNTER — Other Ambulatory Visit (HOSPITAL_COMMUNITY): Payer: Self-pay

## 2023-07-11 NOTE — Progress Notes (Signed)
Specialty Pharmacy Ongoing Clinical Assessment Note  Stephen Griffin is a 43 y.o. adult who is being followed by the specialty pharmacy service for RxSp HIV   Patient's specialty medication(s) reviewed today: Darun-Cobic-Emtricit-TenofAF (Symtuza); Dolutegravir Sodium (Tivicay)   Missed doses in the last 4 weeks: 0   Patient/Caregiver did not have any additional questions or concerns.   Therapeutic benefit summary: Patient is achieving benefit   Adverse events/side effects summary: No adverse events/side effects   Patient's therapy is appropriate to: Continue    Goals Addressed             This Visit's Progress    Achieve Undetectable HIV Viral Load < 20       Patient is on track. Patient will maintain adherence.  Patient's last viral load remained undetectable (04/17/23)         Follow up:  6 months  Servando Snare Specialty Pharmacist

## 2023-07-11 NOTE — Progress Notes (Signed)
Specialty Pharmacy Refill Coordination Note  Stephen Griffin is a 43 y.o. adult contacted today regarding refills of specialty medication(s) Darun-Cobic-Emtricit-TenofAF (Symtuza); Dolutegravir Sodium (Tivicay)   Patient requested Delivery   Delivery date: 07/20/23   Verified address: [address] [name] [address]   Medication will be filled on 07/19/23.

## 2023-07-18 ENCOUNTER — Other Ambulatory Visit: Payer: Self-pay
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
## 2023-08-08 ENCOUNTER — Other Ambulatory Visit: Payer: Self-pay

## 2023-08-08 NOTE — Progress Notes (Signed)
 Specialty Pharmacy Refill Coordination Note  Stephen Griffin is a 43 y.o. adult contacted today regarding refills of specialty medication(s) Darun-Cobic-Emtricit-TenofAF (Symtuza); Dolutegravir Sodium (Tivicay)   Patient requested Delivery   Delivery date: 08/15/23   Verified address: [address] [name] [address]   Medication will be filled on 08/14/23.

## 2023-08-14 ENCOUNTER — Other Ambulatory Visit: Payer: Self-pay

## 2023-08-29 ENCOUNTER — Other Ambulatory Visit: Payer: Self-pay

## 2023-08-29 DIAGNOSIS — B20 Human immunodeficiency virus [HIV] disease: Secondary | ICD-10-CM

## 2023-08-29 DIAGNOSIS — Z113 Encounter for screening for infections with a predominantly sexual mode of transmission: Secondary | ICD-10-CM

## 2023-09-04 ENCOUNTER — Other Ambulatory Visit: Payer: Medicaid Other

## 2023-09-07 ENCOUNTER — Other Ambulatory Visit (HOSPITAL_COMMUNITY)
Admission: RE | Admit: 2023-09-07 | Discharge: 2023-09-07 | Disposition: A | Discharge status: Home / Self Care | Source: Ambulatory Visit | Attending: Infectious Disease | Admitting: Infectious Disease

## 2023-09-07 ENCOUNTER — Other Ambulatory Visit: Payer: Self-pay

## 2023-09-07 ENCOUNTER — Other Ambulatory Visit

## 2023-09-07 DIAGNOSIS — B20 Human immunodeficiency virus [HIV] disease: Secondary | ICD-10-CM

## 2023-09-07 DIAGNOSIS — Z113 Encounter for screening for infections with a predominantly sexual mode of transmission: Secondary | ICD-10-CM | POA: Diagnosis not present

## 2023-09-08 LAB — URINE CYTOLOGY ANCILLARY ONLY
Chlamydia: NEGATIVE
Comment: NEGATIVE
Comment: NORMAL
Neisseria Gonorrhea: NEGATIVE

## 2023-09-08 LAB — T-HELPER CELL (CD4) - (RCID CLINIC ONLY)
CD4 % Helper T Cell: 24 % — ABNORMAL LOW (ref 33–65)
CD4 T Cell Abs: 693 /uL (ref 400–1790)

## 2023-09-10 LAB — HIV-1 RNA QUANT-NO REFLEX-BLD
HIV 1 RNA Quant: 3200 {copies}/mL — ABNORMAL HIGH
HIV-1 RNA Quant, Log: 3.51 {Log_copies}/mL — ABNORMAL HIGH

## 2023-09-10 LAB — COMPLETE METABOLIC PANEL WITHOUT GFR
AG Ratio: 1.4 (calc) (ref 1.0–2.5)
ALT: 27 U/L (ref 9–46)
AST: 33 U/L (ref 10–40)
Albumin: 4.5 g/dL (ref 3.6–5.1)
Alkaline phosphatase (APISO): 92 U/L (ref 36–130)
BUN: 15 mg/dL (ref 7–25)
CO2: 26 mmol/L (ref 20–32)
Calcium: 9.8 mg/dL (ref 8.6–10.3)
Chloride: 105 mmol/L (ref 98–110)
Creat: 0.95 mg/dL (ref 0.60–1.29)
Globulin: 3.3 g/dL (ref 1.9–3.7)
Glucose, Bld: 88 mg/dL (ref 65–99)
Potassium: 4 mmol/L (ref 3.5–5.3)
Sodium: 139 mmol/L (ref 135–146)
Total Bilirubin: 0.4 mg/dL (ref 0.2–1.2)
Total Protein: 7.8 g/dL (ref 6.1–8.1)

## 2023-09-10 LAB — CBC WITH DIFFERENTIAL/PLATELET
Absolute Lymphocytes: 3353 {cells}/uL (ref 850–3900)
Absolute Monocytes: 737 {cells}/uL (ref 200–950)
Basophils Absolute: 41 {cells}/uL (ref 0–200)
Basophils Relative: 0.5 %
Eosinophils Absolute: 73 {cells}/uL (ref 15–500)
Eosinophils Relative: 0.9 %
HCT: 46.7 % (ref 38.5–50.0)
Hemoglobin: 15.5 g/dL (ref 13.2–17.1)
MCH: 31.6 pg (ref 27.0–33.0)
MCHC: 33.2 g/dL (ref 32.0–36.0)
MCV: 95.1 fL (ref 80.0–100.0)
MPV: 9.7 fL (ref 7.5–12.5)
Monocytes Relative: 9.1 %
Neutro Abs: 3896 {cells}/uL (ref 1500–7800)
Neutrophils Relative %: 48.1 %
Platelets: 426 10*3/uL — ABNORMAL HIGH (ref 140–400)
RBC: 4.91 10*6/uL (ref 4.20–5.80)
RDW: 11.4 % (ref 11.0–15.0)
Total Lymphocyte: 41.4 %
WBC: 8.1 10*3/uL (ref 3.8–10.8)

## 2023-09-10 LAB — RPR TITER: RPR Titer: 1:2 {titer} — ABNORMAL HIGH

## 2023-09-10 LAB — T PALLIDUM AB: T Pallidum Abs: POSITIVE — AB

## 2023-09-10 LAB — RPR: RPR Ser Ql: REACTIVE — AB

## 2023-09-11 ENCOUNTER — Telehealth: Payer: Self-pay

## 2023-09-11 ENCOUNTER — Other Ambulatory Visit: Payer: Self-pay

## 2023-09-11 NOTE — Telephone Encounter (Signed)
-----   Message from Lula sent at 09/11/2023  9:00 AM EDT ----- Chocolate is viremic again, How muc hof her meds has she missed. I will want to recheck VL with reflex to genotype when she comes in ----- Message ----- From: Interface, [facility] Lab Results In Sent: 09/07/2023   4:23 PM EDT To: Charolette Copier, MD

## 2023-09-11 NOTE — Telephone Encounter (Signed)
 Patient stated that she has missed a "couple of doses". Patient has appointment with Dr.Van Dam on 4/21 at 1:45.     Shaka Cardin Roann Chestnut, CMA

## 2023-09-11 NOTE — Progress Notes (Signed)
 Specialty Pharmacy Refill Coordination Note  Stephen Griffin is a 43 y.o. adult contacted today regarding refills of specialty medication(s) Darun-Cobic-Emtricit-TenofAF (Symtuza); Dolutegravir Sodium (Tivicay)   Patient requested Delivery   Delivery date: 09/14/23   Verified address: [address] [place] [address]   Medication will be filled on 09/13/23.

## 2023-09-13 ENCOUNTER — Other Ambulatory Visit: Payer: Self-pay

## 2023-09-17 ENCOUNTER — Encounter: Payer: Self-pay | Admitting: Infectious Disease

## 2023-09-17 DIAGNOSIS — B20 Human immunodeficiency virus [HIV] disease: Secondary | ICD-10-CM | POA: Insufficient documentation

## 2023-09-17 HISTORY — DX: Human immunodeficiency virus (HIV) disease: B20
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
## 2023-09-18 ENCOUNTER — Other Ambulatory Visit: Payer: Self-pay

## 2023-09-18 ENCOUNTER — Other Ambulatory Visit (HOSPITAL_COMMUNITY): Payer: Self-pay

## 2023-09-18 ENCOUNTER — Encounter: Payer: Self-pay | Admitting: Infectious Disease

## 2023-09-18 ENCOUNTER — Ambulatory Visit (INDEPENDENT_AMBULATORY_CARE_PROVIDER_SITE_OTHER): Payer: Medicaid Other | Admitting: Infectious Disease

## 2023-09-18 VITALS — BP 130/83 | HR 92 | Resp 16 | Ht 71.0 in | Wt 160.6 lb

## 2023-09-18 DIAGNOSIS — R85612 Low grade squamous intraepithelial lesion on cytologic smear of anus (LGSIL): Secondary | ICD-10-CM

## 2023-09-18 DIAGNOSIS — I10 Essential (primary) hypertension: Secondary | ICD-10-CM | POA: Diagnosis not present

## 2023-09-18 DIAGNOSIS — Z113 Encounter for screening for infections with a predominantly sexual mode of transmission: Secondary | ICD-10-CM

## 2023-09-18 DIAGNOSIS — E785 Hyperlipidemia, unspecified: Secondary | ICD-10-CM

## 2023-09-18 DIAGNOSIS — E782 Mixed hyperlipidemia: Secondary | ICD-10-CM

## 2023-09-18 DIAGNOSIS — Z23 Encounter for immunization: Secondary | ICD-10-CM

## 2023-09-18 DIAGNOSIS — B20 Human immunodeficiency virus [HIV] disease: Secondary | ICD-10-CM

## 2023-09-18 DIAGNOSIS — Z7185 Encounter for immunization safety counseling: Secondary | ICD-10-CM

## 2023-09-18 DIAGNOSIS — Z789 Other specified health status: Secondary | ICD-10-CM

## 2023-09-18 DIAGNOSIS — F640 Transsexualism: Secondary | ICD-10-CM | POA: Diagnosis not present

## 2023-09-18 DIAGNOSIS — H35033 Hypertensive retinopathy, bilateral: Secondary | ICD-10-CM

## 2023-09-18 MED ORDER — SYMTUZA 800-150-200-10 MG PO TABS
1.0000 | ORAL_TABLET | Freq: Every day | ORAL | 11 refills | Status: DC
  Filled 2023-09-18 – 2023-10-11 (×2): qty 30, 30d supply, fill #0
  Filled 2023-11-10: qty 30, 30d supply, fill #1

## 2023-09-18 MED ORDER — TIVICAY 50 MG PO TABS
50.0000 mg | ORAL_TABLET | Freq: Every day | ORAL | 3 refills | Status: DC
  Filled 2023-09-18 – 2023-10-11 (×2): qty 30, 30d supply, fill #0
  Filled 2023-11-10: qty 30, 30d supply, fill #1

## 2023-09-18 MED ORDER — ESTRADIOL VALERATE 20 MG/ML IM OIL
10.0000 mg | TOPICAL_OIL | INTRAMUSCULAR | 6 refills | Status: DC
  Filled 2023-09-18: qty 5, fill #0
  Filled 2023-10-11: qty 5, 28d supply, fill #0

## 2023-09-18 MED ORDER — SPIRONOLACTONE 50 MG PO TABS
50.0000 mg | ORAL_TABLET | Freq: Every day | ORAL | 11 refills | Status: DC
  Filled 2023-09-18: qty 30, 30d supply, fill #0
  Filled 2023-10-11: qty 30, 30d supply, fill #1

## 2023-09-18 MED ORDER — ROSUVASTATIN CALCIUM 20 MG PO TABS
20.0000 mg | ORAL_TABLET | Freq: Every day | ORAL | 11 refills | Status: DC
  Filled 2023-09-18 – 2023-10-11 (×2): qty 30, 30d supply, fill #0

## 2023-09-18 MED ORDER — SPIRONOLACTONE 100 MG PO TABS
100.0000 mg | ORAL_TABLET | Freq: Every day | ORAL | 11 refills | Status: DC
  Filled 2023-09-18: qty 30, 30d supply, fill #0

## 2023-09-18 NOTE — Patient Instructions (Signed)
 Please call [facility] (on [facility])  [phone]  To make appt with Joyce Nixon, MD for HRA

## 2023-09-19 ENCOUNTER — Other Ambulatory Visit (HOSPITAL_COMMUNITY): Payer: Self-pay

## 2023-09-19 ENCOUNTER — Other Ambulatory Visit: Payer: Self-pay

## 2023-09-19 LAB — C. TRACHOMATIS/N. GONORRHOEAE RNA
C. trachomatis RNA, TMA: NOT DETECTED
N. gonorrhoeae RNA, TMA: NOT DETECTED

## 2023-09-19 LAB — GC/CHLAMYDIA PROBE, AMP (THROAT)
Chlamydia trachomatis RNA: NOT DETECTED
Neisseria gonorrhoeae RNA: NOT DETECTED

## 2023-09-19 LAB — CT/NG RNA, TMA RECTAL
Chlamydia Trachomatis RNA: NOT DETECTED
Neisseria Gonorrhoeae RNA: NOT DETECTED

## 2023-09-21 LAB — HIV RNA, RTPCR W/R GT (RTI, PI,INT)
HIV 1 RNA Quant: 112 {copies}/mL — ABNORMAL HIGH
HIV-1 RNA Quant, Log: 2.05 {Log_copies}/mL — ABNORMAL HIGH

## 2023-09-21 LAB — RPR TITER: RPR Titer: 1:1 {titer} — ABNORMAL HIGH

## 2023-09-21 LAB — RPR: RPR Ser Ql: REACTIVE — AB

## 2023-09-21 LAB — T PALLIDUM AB: T Pallidum Abs: POSITIVE — AB

## 2023-10-09 ENCOUNTER — Other Ambulatory Visit: Payer: Self-pay

## 2023-10-10 NOTE — Progress Notes (Signed)
 The 10-year ASCVD risk score (Arnett DK, et al., 2019) is: 10.1%   Values used to calculate the score:     Age: 43 years     Sex: Male     Is Non-Hispanic African American: Yes     Diabetic: No     Tobacco smoker: Yes     Systolic Blood Pressure: 130 mmHg     Is BP treated: Yes     HDL Cholesterol: 46 mg/dL     Total Cholesterol: 151 mg/dL  Arlon Bergamo, BSN, RN

## 2023-10-11 ENCOUNTER — Other Ambulatory Visit: Payer: Self-pay

## 2023-10-11 NOTE — Progress Notes (Signed)
 Specialty Pharmacy Refill Coordination Note  Stephen Griffin is a 43 y.o. adult contacted today regarding refills of specialty medication(s) Darun-Cobic-Emtricit-TenofAF (Symtuza ); Dolutegravir  Sodium (Tivicay )   Patient requested Delivery   Delivery date: 10/13/23   Verified address: [address] [place] [address]   Medication will be filled on 10/12/23.

## 2023-11-08 ENCOUNTER — Other Ambulatory Visit: Payer: Self-pay

## 2023-11-10 ENCOUNTER — Other Ambulatory Visit: Payer: Self-pay

## 2023-11-10 NOTE — Progress Notes (Signed)
 Specialty Pharmacy Refill Coordination Note  Stephen Griffin is a 43 y.o. adult contacted today regarding refills of specialty medication(s) Darun-Cobic-Emtricit-TenofAF (Symtuza ); Dolutegravir  Sodium (Tivicay )   Patient requested Delivery   Delivery date: 11/13/23   Verified address: [address] [place] [address]   Medication will be filled on 11/10/23.
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
## 2023-11-20 ENCOUNTER — Other Ambulatory Visit: Payer: Self-pay

## 2023-11-20 ENCOUNTER — Other Ambulatory Visit (HOSPITAL_COMMUNITY): Payer: Self-pay

## 2023-11-20 ENCOUNTER — Other Ambulatory Visit (HOSPITAL_COMMUNITY)
Admission: RE | Admit: 2023-11-20 | Discharge: 2023-11-20 | Disposition: A | Discharge status: Home / Self Care | Source: Ambulatory Visit | Attending: Infectious Disease | Admitting: Infectious Disease

## 2023-11-20 ENCOUNTER — Encounter: Payer: Self-pay | Admitting: Infectious Disease

## 2023-11-20 ENCOUNTER — Ambulatory Visit (INDEPENDENT_AMBULATORY_CARE_PROVIDER_SITE_OTHER): Payer: Self-pay | Admitting: Infectious Disease

## 2023-11-20 VITALS — BP 135/92 | HR 97 | Temp 97.7°F | Wt 165.0 lb

## 2023-11-20 DIAGNOSIS — H35033 Hypertensive retinopathy, bilateral: Secondary | ICD-10-CM

## 2023-11-20 DIAGNOSIS — R85612 Low grade squamous intraepithelial lesion on cytologic smear of anus (LGSIL): Secondary | ICD-10-CM | POA: Diagnosis not present

## 2023-11-20 DIAGNOSIS — E785 Hyperlipidemia, unspecified: Secondary | ICD-10-CM | POA: Diagnosis not present

## 2023-11-20 DIAGNOSIS — B20 Human immunodeficiency virus [HIV] disease: Secondary | ICD-10-CM

## 2023-11-20 DIAGNOSIS — Z789 Other specified health status: Secondary | ICD-10-CM | POA: Diagnosis not present

## 2023-11-20 DIAGNOSIS — Z23 Encounter for immunization: Secondary | ICD-10-CM | POA: Diagnosis not present

## 2023-11-20 DIAGNOSIS — Z7185 Encounter for immunization safety counseling: Secondary | ICD-10-CM

## 2023-11-20 DIAGNOSIS — E782 Mixed hyperlipidemia: Secondary | ICD-10-CM

## 2023-11-20 MED ORDER — ESTRADIOL VALERATE 20 MG/ML IM OIL
10.0000 mg | TOPICAL_OIL | INTRAMUSCULAR | 6 refills | Status: DC
  Filled 2023-11-20: qty 5, 28d supply, fill #0

## 2023-11-20 MED ORDER — TIVICAY 50 MG PO TABS
50.0000 mg | ORAL_TABLET | Freq: Every day | ORAL | 3 refills | Status: DC
  Filled 2023-11-20 – 2023-12-04 (×2): qty 30, 30d supply, fill #0
  Filled 2024-01-03: qty 30, 30d supply, fill #1
  Filled 2024-01-31: qty 30, 30d supply, fill #2
  Filled 2024-02-29 – 2024-04-09 (×2): qty 30, 30d supply, fill #3

## 2023-11-20 MED ORDER — SYMTUZA 800-150-200-10 MG PO TABS
1.0000 | ORAL_TABLET | Freq: Every day | ORAL | 11 refills | Status: AC
  Filled 2023-11-20 – 2023-12-04 (×2): qty 30, 30d supply, fill #0
  Filled 2024-01-03: qty 30, 30d supply, fill #1
  Filled 2024-01-31: qty 30, 30d supply, fill #2
  Filled 2024-02-29 – 2024-04-09 (×2): qty 30, 30d supply, fill #3
  Filled 2024-05-03: qty 30, 30d supply, fill #4
  Filled 2024-05-29 – 2024-06-04 (×2): qty 30, 30d supply, fill #5
  Filled 2024-06-27: qty 30, 30d supply, fill #6

## 2023-11-20 MED ORDER — SPIRONOLACTONE 50 MG PO TABS
50.0000 mg | ORAL_TABLET | Freq: Every day | ORAL | 11 refills | Status: AC
  Filled 2023-11-20: qty 30, 30d supply, fill #0

## 2023-11-20 MED ORDER — SPIRONOLACTONE 100 MG PO TABS
100.0000 mg | ORAL_TABLET | Freq: Every day | ORAL | 11 refills | Status: AC
  Filled 2023-11-20: qty 30, 30d supply, fill #0

## 2023-11-20 MED ORDER — ROSUVASTATIN CALCIUM 20 MG PO TABS
20.0000 mg | ORAL_TABLET | Freq: Every day | ORAL | 11 refills | Status: AC
  Filled 2023-11-20: qty 30, 30d supply, fill #0

## 2023-11-20 NOTE — Patient Instructions (Signed)
  Please call [facility] to schedule appt for HRA procedure  Address: [address], [place], [place] [address] Phone: [phone] Products and Services: [email or web address]

## 2023-11-20 NOTE — Addendum Note (Signed)
 Addended by: Manjinder Breau M on: 11/20/2023 03:01 PM   Modules accepted: Orders

## 2023-11-21 ENCOUNTER — Ambulatory Visit: Payer: Self-pay | Admitting: Pharmacist

## 2023-11-21 LAB — CYTOLOGY, (ORAL, ANAL, URETHRAL) ANCILLARY ONLY
Chlamydia: NEGATIVE
Comment: NEGATIVE
Comment: NORMAL
Neisseria Gonorrhea: NEGATIVE

## 2023-11-21 LAB — T-HELPER CELLS (CD4) COUNT (NOT AT ARMC)
CD4 % Helper T Cell: 28 % — ABNORMAL LOW (ref 33–65)
CD4 T Cell Abs: 395 /uL — ABNORMAL LOW (ref 400–1790)

## 2023-11-21 LAB — URINE CYTOLOGY ANCILLARY ONLY
Chlamydia: NEGATIVE
Comment: NEGATIVE
Comment: NORMAL
Neisseria Gonorrhea: NEGATIVE

## 2023-11-21 NOTE — Telephone Encounter (Signed)
 Spoke with [facility], she has only been taking 100 mg of spironolactone . Discussed that she should be taking a 50 mg tablet along with this for a total of 150 mg. Patient verbalized understanding and has no further questions.   She is agreeable to see pharmacy next week to discuss estradiol  dosing. Scheduled with Cassie 6/30.  Nolyn Swab, BSN, RN

## 2023-11-21 NOTE — Progress Notes (Signed)
 I know sometimes the dispense report is not always accurate, but based on that report, it seems like Chocolate isn't filling spironolactone  every month and only picked up the 50 mg dose last month.

## 2023-11-21 NOTE — Progress Notes (Signed)
 Triage, could you set her up with pharmacy in the next couple weeks? Thank you!

## 2023-11-21 NOTE — Telephone Encounter (Signed)
 Called [facility], no answer. Left HIPAA compliant voicemail requesting that she bring any medications she's taking to her pharmacy appointment on 6/30.  Rosalia Mcavoy, BSN, RN

## 2023-11-22 LAB — CYTOLOGY, (ORAL, ANAL, URETHRAL) ANCILLARY ONLY
Chlamydia: NEGATIVE
Comment: NEGATIVE
Comment: NORMAL
Neisseria Gonorrhea: NEGATIVE

## 2023-11-23 LAB — TESTOSTERONE: Testosterone: 123 ng/dL — ABNORMAL LOW (ref 250–827)

## 2023-11-23 LAB — CBC WITH DIFFERENTIAL/PLATELET
Absolute Lymphocytes: 1626 {cells}/uL (ref 850–3900)
Absolute Monocytes: 710 {cells}/uL (ref 200–950)
Basophils Absolute: 43 {cells}/uL (ref 0–200)
Basophils Relative: 0.6 %
Eosinophils Absolute: 21 {cells}/uL (ref 15–500)
Eosinophils Relative: 0.3 %
HCT: 44.6 % (ref 38.5–50.0)
Hemoglobin: 14.5 g/dL (ref 13.2–17.1)
MCH: 31.8 pg (ref 27.0–33.0)
MCHC: 32.5 g/dL (ref 32.0–36.0)
MCV: 97.8 fL (ref 80.0–100.0)
MPV: 9.9 fL (ref 7.5–12.5)
Monocytes Relative: 10 %
Neutro Abs: 4700 {cells}/uL (ref 1500–7800)
Neutrophils Relative %: 66.2 %
Platelets: 416 10*3/uL — ABNORMAL HIGH (ref 140–400)
RBC: 4.56 10*6/uL (ref 4.20–5.80)
RDW: 11.5 % (ref 11.0–15.0)
Total Lymphocyte: 22.9 %
WBC: 7.1 10*3/uL (ref 3.8–10.8)

## 2023-11-23 LAB — HIV RNA, RTPCR W/R GT (RTI, PI,INT)
HIV 1 RNA Quant: 30 {copies}/mL — ABNORMAL HIGH
HIV-1 RNA Quant, Log: 1.48 {Log_copies}/mL — ABNORMAL HIGH

## 2023-11-23 LAB — COMPLETE METABOLIC PANEL WITHOUT GFR
AG Ratio: 1.3 (calc) (ref 1.0–2.5)
ALT: 36 U/L (ref 9–46)
AST: 31 U/L (ref 10–40)
Albumin: 4.5 g/dL (ref 3.6–5.1)
Alkaline phosphatase (APISO): 87 U/L (ref 36–130)
BUN: 15 mg/dL (ref 7–25)
CO2: 23 mmol/L (ref 20–32)
Calcium: 10 mg/dL (ref 8.6–10.3)
Chloride: 107 mmol/L (ref 98–110)
Creat: 1.02 mg/dL (ref 0.60–1.29)
Globulin: 3.4 g/dL (ref 1.9–3.7)
Glucose, Bld: 78 mg/dL (ref 65–99)
Potassium: 4.1 mmol/L (ref 3.5–5.3)
Sodium: 139 mmol/L (ref 135–146)
Total Bilirubin: 0.4 mg/dL (ref 0.2–1.2)
Total Protein: 7.9 g/dL (ref 6.1–8.1)

## 2023-11-23 LAB — T PALLIDUM AB: T Pallidum Abs: POSITIVE — AB

## 2023-11-23 LAB — ESTRADIOL: Estradiol: 400 pg/mL — ABNORMAL HIGH (ref <=39)

## 2023-11-23 LAB — RPR: RPR Ser Ql: REACTIVE — AB

## 2023-11-23 LAB — RPR TITER: RPR Titer: 1:32 {titer} — ABNORMAL HIGH

## 2023-11-23 NOTE — Telephone Encounter (Signed)
 Spoke with [facility], discussed positive syphilis result. Offered appointment for today or tomorrow, she would prefer to receive treatment at upcoming appointment on 7/1. Advised no sex until treated plus an additional 7 days. Patient verbalized understanding and has no further questions.   Chessie Neuharth, BSN, RN

## 2023-11-27 ENCOUNTER — Ambulatory Visit: Admitting: Pharmacist
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
## 2023-11-28 ENCOUNTER — Other Ambulatory Visit (HOSPITAL_COMMUNITY): Payer: Self-pay

## 2023-11-28 ENCOUNTER — Ambulatory Visit: Admitting: Pharmacist

## 2023-11-28 ENCOUNTER — Other Ambulatory Visit: Payer: Self-pay

## 2023-11-28 DIAGNOSIS — A539 Syphilis, unspecified: Secondary | ICD-10-CM | POA: Diagnosis not present

## 2023-11-28 DIAGNOSIS — Z789 Other specified health status: Secondary | ICD-10-CM

## 2023-11-28 MED ORDER — PENICILLIN G BENZATHINE 1200000 UNIT/2ML IM SUSY
2.4000 10*6.[IU] | PREFILLED_SYRINGE | Freq: Once | INTRAMUSCULAR | Status: AC
Start: 2023-11-28 — End: 2023-11-28
  Administered 2023-11-28: 2.4 10*6.[IU] via INTRAMUSCULAR

## 2023-11-28 MED ORDER — ESTRADIOL VALERATE 20 MG/ML IM OIL
5.0000 mg | TOPICAL_OIL | INTRAMUSCULAR | 11 refills | Status: AC
  Filled 2023-11-28: qty 5, 280d supply, fill #0

## 2023-12-04 ENCOUNTER — Other Ambulatory Visit: Payer: Self-pay

## 2023-12-04 NOTE — Progress Notes (Signed)
 Specialty Pharmacy Refill Coordination Note  Stephen Griffin is a 43 y.o. adult contacted today regarding refills of specialty medication(s) Darun-Cobic-Emtricit-TenofAF (Symtuza ); Dolutegravir  Sodium (Tivicay )   Patient requested Delivery   Delivery date: 12/07/23   Verified address: [address] [name] [address]   Medication will be filled on 12/06/23.

## 2023-12-05 ENCOUNTER — Other Ambulatory Visit: Payer: Self-pay

## 2023-12-22 ENCOUNTER — Other Ambulatory Visit: Payer: Self-pay

## 2024-01-01 ENCOUNTER — Other Ambulatory Visit: Payer: Self-pay

## 2024-01-03 ENCOUNTER — Other Ambulatory Visit: Payer: Self-pay

## 2024-01-05 ENCOUNTER — Other Ambulatory Visit: Payer: Self-pay

## 2024-01-05 NOTE — Progress Notes (Signed)
 Specialty Pharmacy Refill Coordination Note  Stephen Griffin is a 43 y.o. adult contacted today regarding refills of specialty medication(s) Darun-Cobic-Emtricit-TenofAF (Symtuza ); Dolutegravir  Sodium (Tivicay )   Patient requested Delivery   Delivery date: 01/08/24   Verified address: [address] [name]  [address]   Medication will be filled on 01/05/24.

## 2024-01-09 ENCOUNTER — Other Ambulatory Visit: Payer: Self-pay

## 2024-01-09 ENCOUNTER — Other Ambulatory Visit (HOSPITAL_BASED_OUTPATIENT_CLINIC_OR_DEPARTMENT_OTHER): Payer: Self-pay

## 2024-01-09 ENCOUNTER — Emergency Department (HOSPITAL_COMMUNITY)
Admission: EM | Admit: 2024-01-09 | Discharge: 2024-01-09 | Disposition: A | Discharge status: Home / Self Care | Attending: Emergency Medicine | Admitting: Emergency Medicine

## 2024-01-09 ENCOUNTER — Encounter (HOSPITAL_COMMUNITY): Payer: Self-pay | Admitting: Emergency Medicine

## 2024-01-09 ENCOUNTER — Other Ambulatory Visit (HOSPITAL_COMMUNITY): Payer: Self-pay

## 2024-01-09 ENCOUNTER — Emergency Department (HOSPITAL_COMMUNITY)

## 2024-01-09 DIAGNOSIS — S0240FA Zygomatic fracture, left side, initial encounter for closed fracture: Secondary | ICD-10-CM | POA: Diagnosis not present

## 2024-01-09 DIAGNOSIS — W108XXA Fall (on) (from) other stairs and steps, initial encounter: Secondary | ICD-10-CM | POA: Insufficient documentation

## 2024-01-09 DIAGNOSIS — Y9301 Activity, walking, marching and hiking: Secondary | ICD-10-CM | POA: Diagnosis not present

## 2024-01-09 DIAGNOSIS — S0993XA Unspecified injury of face, initial encounter: Secondary | ICD-10-CM | POA: Diagnosis not present

## 2024-01-09 DIAGNOSIS — S0240DA Maxillary fracture, left side, initial encounter for closed fracture: Secondary | ICD-10-CM

## 2024-01-09 DIAGNOSIS — Z21 Asymptomatic human immunodeficiency virus [HIV] infection status: Secondary | ICD-10-CM | POA: Insufficient documentation

## 2024-01-09 DIAGNOSIS — W19XXXA Unspecified fall, initial encounter: Secondary | ICD-10-CM | POA: Diagnosis not present

## 2024-01-09 DIAGNOSIS — F17200 Nicotine dependence, unspecified, uncomplicated: Secondary | ICD-10-CM | POA: Diagnosis not present

## 2024-01-09 DIAGNOSIS — S02401A Maxillary fracture, unspecified, initial encounter for closed fracture: Secondary | ICD-10-CM | POA: Diagnosis not present

## 2024-01-09 DIAGNOSIS — S02832A Fracture of medial orbital wall, left side, initial encounter for closed fracture: Secondary | ICD-10-CM | POA: Diagnosis not present

## 2024-01-09 DIAGNOSIS — S0232XA Fracture of orbital floor, left side, initial encounter for closed fracture: Secondary | ICD-10-CM | POA: Diagnosis not present

## 2024-01-09 DIAGNOSIS — R609 Edema, unspecified: Secondary | ICD-10-CM | POA: Diagnosis not present

## 2024-01-09 MED ORDER — NAPROXEN 500 MG PO TABS
500.0000 mg | ORAL_TABLET | Freq: Two times a day (BID) | ORAL | 0 refills | Status: AC
  Filled 2024-01-09: qty 30, 15d supply, fill #0

## 2024-01-09 MED ORDER — HYDROCODONE-ACETAMINOPHEN 5-325 MG PO TABS
1.0000 | ORAL_TABLET | ORAL | 0 refills | Status: AC | PRN
  Filled 2024-01-09: qty 15, 3d supply, fill #0

## 2024-01-09 MED ORDER — OXYCODONE-ACETAMINOPHEN 5-325 MG PO TABS
1.0000 | ORAL_TABLET | Freq: Once | ORAL | Status: AC
  Administered 2024-01-09 (×2): 1 via ORAL
  Filled 2024-01-09: qty 1

## 2024-01-09 MED ORDER — KETOROLAC TROMETHAMINE 15 MG/ML IJ SOLN
15.0000 mg | Freq: Once | INTRAMUSCULAR | Status: AC
  Administered 2024-01-09 (×2): 15 mg via INTRAMUSCULAR
  Filled 2024-01-09: qty 1

## 2024-01-09 NOTE — ED Triage Notes (Signed)
 BIBA Per EMS: Pt coming from home w/ c/o broken nose. Fell face down on concrete after walking down her steps. Also has swelling to left eye. Denies LOC, thinners.

## 2024-01-09 NOTE — ED Provider Notes (Signed)
 [place] EMERGENCY DEPARTMENT AT [facility] Provider Note   CSN: [id number] Arrival date & time: 01/09/24  0100     Patient presents with: Facial Pain   Stephen Griffin is a 43 y.o. adult.  Transgender male patient with history of HIV, cytomegalovirus infection, wasting syndrome, presents to the emergency room complaining of left-sided facial injury secondary to a fall.  Patient states they were drinking alcohol earlier this evening and fell down 4-5 steps landing directly on their face.  The patient denies loss of consciousness, blood thinner usage.   HPI     Prior to Admission medications   Medication Sig Start Date End Date Taking? Authorizing Provider  Darunavir -Cobicistat -Emtricitabine -Tenofovir  Alafenamide (SYMTUZA ) 800-150-200-10 MG TABS Take 1 tablet by mouth daily with breakfast. 11/20/23   Fleeta Rothman, Jomarie SAILOR, MD  dolutegravir  (TIVICAY ) 50 MG tablet Take 1 tablet (50 mg total) by mouth daily. 11/20/23   Fleeta Rothman Jomarie SAILOR, MD  doxycycline  (VIBRA -TABS) 100 MG tablet TAKE 2 TABLETS BY MOUTH AS DIRECTED. TAKE AFTER UNPROTECTED SEX TO TO PREVENT CHLAMYDIA AND SYPHILIS 02/20/23 02/20/24  Fleeta Rothman, Jomarie SAILOR, MD  estradiol  valerate (DELESTROGEN ) 20 MG/ML injection Inject 0.25 mLs (5 mg total) into the muscle every 14 (fourteen) days. Discard vial 28 days after opening 11/28/23   Kuppelweiser, Cassie L, RPH-CPP  HYDROcodone -acetaminophen  (NORCO/VICODIN) 5-325 MG tablet Take 1 tablet by mouth every 4 (four) hours as needed. 01/09/24  Yes Logan Ubaldo NOVAK, PA-C  naproxen  (NAPROSYN ) 500 MG tablet Take 1 tablet (500 mg total) by mouth 2 (two) times daily. 01/09/24  Yes Logan Ubaldo NOVAK, PA-C  oxyCODONE -acetaminophen  (PERCOCET/ROXICET) 5-325 MG tablet Take 1 tablet by mouth every 6 (six) hours as needed for severe pain. Patient not taking: Reported on 11/20/2023 01/04/23   Smoot, Lauraine LABOR, PA-C  rosuvastatin  (CRESTOR ) 20 MG tablet Take 1 tablet (20 mg total) by mouth daily.  11/20/23   Fleeta Rothman Jomarie SAILOR, MD  spironolactone  (ALDACTONE ) 100 MG tablet Take 1 tablet (100 mg total) by mouth daily. 11/20/23   Fleeta Rothman Jomarie SAILOR, MD  spironolactone  (ALDACTONE ) 50 MG tablet Take 1 tablet (50 mg total) by mouth daily. Take WITH 100mg  tablet for total of 150mg  daily 11/20/23   Fleeta Rothman, Jomarie SAILOR, MD    Allergies: Patient has no known allergies.    Review of Systems  Updated Vital Signs BP (!) 154/108   Pulse 94   Temp 98 F (36.7 C) (Oral)   Resp 16   SpO2 100%   Physical Exam Vitals and nursing note reviewed.  HENT:     Head: Normocephalic.     Comments: Left-sided periorbital swelling, left nasal passages are swollen.  No obvious septal hematoma on exam.  No active epistaxis.  Extraocular movement intact without pain.  Vision grossly intact when eyelid held open. Eyes:     Pupils: Pupils are equal, round, and reactive to light.  Pulmonary:     Effort: Pulmonary effort is normal. No respiratory distress.  Musculoskeletal:        General: No signs of injury.     Cervical back: Normal range of motion.  Skin:    General: Skin is dry.  Neurological:     Mental Status: Stephen Griffin is alert.  Psychiatric:        Speech: Speech normal.        Behavior: Behavior normal.     (all labs ordered are listed, but only abnormal results are displayed) Labs Reviewed - No data  to display  EKG: None  Radiology: CT Head Wo Contrast Result Date: 01/09/2024 CLINICAL DATA:  Polytrauma, blunt; Facial trauma, blunt. Fall, nasal fracture, left periorbital swelling EXAM: CT HEAD WITHOUT CONTRAST CT MAXILLOFACIAL WITHOUT CONTRAST CT CERVICAL SPINE WITHOUT CONTRAST TECHNIQUE: Multidetector CT imaging of the head, cervical spine, and maxillofacial structures were performed using the standard protocol without intravenous contrast. Multiplanar CT image reconstructions of the cervical spine and maxillofacial structures were also generated. RADIATION DOSE REDUCTION: This exam was  performed according to the departmental dose-optimization program which includes automated exposure control, adjustment of the mA and/or kV according to patient size and/or use of iterative reconstruction technique. COMPARISON:  None Available. FINDINGS: CT HEAD FINDINGS Brain: No evidence of acute infarction, hemorrhage, hydrocephalus, extra-axial collection or mass lesion/mass effect. Vascular: No hyperdense vessel or unexpected calcification. Skull: Normal. Negative for fracture or focal lesion. Other: Mastoid air cells and middle ear cavities are clear. Moderate debris within the external auditory canals bilaterally. CT MAXILLOFACIAL FINDINGS Osseous: There is a markedly comminuted, impacted fracture the anterior and medial walls of the left maxillary sinus, a depressed fracture of the lateral aspect of the left orbital floor by approximately 3 mm, and a minimally displaced fracture of the left medial orbital wall which extends into the left nasal bone which appears minimally displaced. Comminution and impaction of the medial wall of the left maxillary antrum results in marked narrowing of the left nares inferiorly (57/9). There is a remote appearing mildly impacted fracture of the left zygomatic arch. The lateral orbital wall, posterior wall of the left maxillary sinus, and pterygoid plates appear intact. No other facial fracture identified. Orbits: Left scleral banding and lens removal has been performed. The ocular globes are intact. The retro-orbital fat is preserved; no orbital hematoma or inflammatory changes are identified. Gas is seen intraconal retro-orbital space on the left secondary to inferior orbital wall fracture. Sinuses: Extensive high attenuation material is seen within the left maxillary sinus and left nasal passage is in keeping with blood. Remaining paranasal sinuses are clear Soft tissues: There is extensive soft tissue swelling involving the left infraorbital soft tissues and moderate left  preseptal soft tissue swelling CT CERVICAL SPINE FINDINGS Alignment: Normal. Skull base and vertebrae: No acute fracture. No primary bone lesion or focal pathologic process. Soft tissues and spinal canal: No prevertebral fluid or swelling. No visible canal hematoma. Disc levels: There is disc space narrowing and endplate remodeling seen at C4-C7, most severe at C6-7 in keeping with changes of mild to moderate degenerative disc disease. Prevertebral soft tissues are not thickened on sagittal reformats. Broad-based posterior disc bulge at C3-4 in combination with congenital narrowing of the spinal canal results in moderate central canal stenosis with flattening of the thecal sac and an AP diameter of the spinal canal approximately 7 mm. Asymmetric uncovertebral arthrosis results in severe bilateral neuroforaminal narrowing at C4-5 and C6-7 and milder narrowing left at C3-4 and C5-6. Upper chest: Unremarkable Other: None IMPRESSION: 1. No acute intracranial abnormality. No calvarial fracture. 2. Markedly comminuted, impacted fracture of the anterior and medial walls of the left maxillary sinus, depressed fracture of the lateral aspect of the left orbital floor by approximately 3 mm, and a minimally displaced fracture of the left medial orbital wall which extends into the left nasal bone which appears minimally displaced. Comminution and impaction of the medial wall of the left maxillary antrum results in marked narrowing of the left nares inferiorly. 3. Remote appearing mildly impacted fracture of the  left zygomatic arch. 4. Extensive soft tissue swelling involving the left infraorbital soft tissues and moderate left preseptal soft tissue swelling. 5. No acute fracture or listhesis of the cervical spine. 6. Degenerative disc and degenerative joint disease resulting in moderate central canal stenosis at C3-4 and multilevel neuroforaminal narrowing as described above. Electronically Signed   By: Dorethia Molt M.D.   On:  01/09/2024 03:53   CT Maxillofacial Wo Contrast Result Date: 01/09/2024 CLINICAL DATA:  Polytrauma, blunt; Facial trauma, blunt. Fall, nasal fracture, left periorbital swelling EXAM: CT HEAD WITHOUT CONTRAST CT MAXILLOFACIAL WITHOUT CONTRAST CT CERVICAL SPINE WITHOUT CONTRAST TECHNIQUE: Multidetector CT imaging of the head, cervical spine, and maxillofacial structures were performed using the standard protocol without intravenous contrast. Multiplanar CT image reconstructions of the cervical spine and maxillofacial structures were also generated. RADIATION DOSE REDUCTION: This exam was performed according to the departmental dose-optimization program which includes automated exposure control, adjustment of the mA and/or kV according to patient size and/or use of iterative reconstruction technique. COMPARISON:  None Available. FINDINGS: CT HEAD FINDINGS Brain: No evidence of acute infarction, hemorrhage, hydrocephalus, extra-axial collection or mass lesion/mass effect. Vascular: No hyperdense vessel or unexpected calcification. Skull: Normal. Negative for fracture or focal lesion. Other: Mastoid air cells and middle ear cavities are clear. Moderate debris within the external auditory canals bilaterally. CT MAXILLOFACIAL FINDINGS Osseous: There is a markedly comminuted, impacted fracture the anterior and medial walls of the left maxillary sinus, a depressed fracture of the lateral aspect of the left orbital floor by approximately 3 mm, and a minimally displaced fracture of the left medial orbital wall which extends into the left nasal bone which appears minimally displaced. Comminution and impaction of the medial wall of the left maxillary antrum results in marked narrowing of the left nares inferiorly (57/9). There is a remote appearing mildly impacted fracture of the left zygomatic arch. The lateral orbital wall, posterior wall of the left maxillary sinus, and pterygoid plates appear intact. No other facial  fracture identified. Orbits: Left scleral banding and lens removal has been performed. The ocular globes are intact. The retro-orbital fat is preserved; no orbital hematoma or inflammatory changes are identified. Gas is seen intraconal retro-orbital space on the left secondary to inferior orbital wall fracture. Sinuses: Extensive high attenuation material is seen within the left maxillary sinus and left nasal passage is in keeping with blood. Remaining paranasal sinuses are clear Soft tissues: There is extensive soft tissue swelling involving the left infraorbital soft tissues and moderate left preseptal soft tissue swelling CT CERVICAL SPINE FINDINGS Alignment: Normal. Skull base and vertebrae: No acute fracture. No primary bone lesion or focal pathologic process. Soft tissues and spinal canal: No prevertebral fluid or swelling. No visible canal hematoma. Disc levels: There is disc space narrowing and endplate remodeling seen at C4-C7, most severe at C6-7 in keeping with changes of mild to moderate degenerative disc disease. Prevertebral soft tissues are not thickened on sagittal reformats. Broad-based posterior disc bulge at C3-4 in combination with congenital narrowing of the spinal canal results in moderate central canal stenosis with flattening of the thecal sac and an AP diameter of the spinal canal approximately 7 mm. Asymmetric uncovertebral arthrosis results in severe bilateral neuroforaminal narrowing at C4-5 and C6-7 and milder narrowing left at C3-4 and C5-6. Upper chest: Unremarkable Other: None IMPRESSION: 1. No acute intracranial abnormality. No calvarial fracture. 2. Markedly comminuted, impacted fracture of the anterior and medial walls of the left maxillary sinus, depressed fracture of the  lateral aspect of the left orbital floor by approximately 3 mm, and a minimally displaced fracture of the left medial orbital wall which extends into the left nasal bone which appears minimally displaced.  Comminution and impaction of the medial wall of the left maxillary antrum results in marked narrowing of the left nares inferiorly. 3. Remote appearing mildly impacted fracture of the left zygomatic arch. 4. Extensive soft tissue swelling involving the left infraorbital soft tissues and moderate left preseptal soft tissue swelling. 5. No acute fracture or listhesis of the cervical spine. 6. Degenerative disc and degenerative joint disease resulting in moderate central canal stenosis at C3-4 and multilevel neuroforaminal narrowing as described above. Electronically Signed   By: Dorethia Molt M.D.   On: 01/09/2024 03:53   CT Cervical Spine Wo Contrast Result Date: 01/09/2024 CLINICAL DATA:  Polytrauma, blunt; Facial trauma, blunt. Fall, nasal fracture, left periorbital swelling EXAM: CT HEAD WITHOUT CONTRAST CT MAXILLOFACIAL WITHOUT CONTRAST CT CERVICAL SPINE WITHOUT CONTRAST TECHNIQUE: Multidetector CT imaging of the head, cervical spine, and maxillofacial structures were performed using the standard protocol without intravenous contrast. Multiplanar CT image reconstructions of the cervical spine and maxillofacial structures were also generated. RADIATION DOSE REDUCTION: This exam was performed according to the departmental dose-optimization program which includes automated exposure control, adjustment of the mA and/or kV according to patient size and/or use of iterative reconstruction technique. COMPARISON:  None Available. FINDINGS: CT HEAD FINDINGS Brain: No evidence of acute infarction, hemorrhage, hydrocephalus, extra-axial collection or mass lesion/mass effect. Vascular: No hyperdense vessel or unexpected calcification. Skull: Normal. Negative for fracture or focal lesion. Other: Mastoid air cells and middle ear cavities are clear. Moderate debris within the external auditory canals bilaterally. CT MAXILLOFACIAL FINDINGS Osseous: There is a markedly comminuted, impacted fracture the anterior and medial  walls of the left maxillary sinus, a depressed fracture of the lateral aspect of the left orbital floor by approximately 3 mm, and a minimally displaced fracture of the left medial orbital wall which extends into the left nasal bone which appears minimally displaced. Comminution and impaction of the medial wall of the left maxillary antrum results in marked narrowing of the left nares inferiorly (57/9). There is a remote appearing mildly impacted fracture of the left zygomatic arch. The lateral orbital wall, posterior wall of the left maxillary sinus, and pterygoid plates appear intact. No other facial fracture identified. Orbits: Left scleral banding and lens removal has been performed. The ocular globes are intact. The retro-orbital fat is preserved; no orbital hematoma or inflammatory changes are identified. Gas is seen intraconal retro-orbital space on the left secondary to inferior orbital wall fracture. Sinuses: Extensive high attenuation material is seen within the left maxillary sinus and left nasal passage is in keeping with blood. Remaining paranasal sinuses are clear Soft tissues: There is extensive soft tissue swelling involving the left infraorbital soft tissues and moderate left preseptal soft tissue swelling CT CERVICAL SPINE FINDINGS Alignment: Normal. Skull base and vertebrae: No acute fracture. No primary bone lesion or focal pathologic process. Soft tissues and spinal canal: No prevertebral fluid or swelling. No visible canal hematoma. Disc levels: There is disc space narrowing and endplate remodeling seen at C4-C7, most severe at C6-7 in keeping with changes of mild to moderate degenerative disc disease. Prevertebral soft tissues are not thickened on sagittal reformats. Broad-based posterior disc bulge at C3-4 in combination with congenital narrowing of the spinal canal results in moderate central canal stenosis with flattening of the thecal sac and an AP  diameter of the spinal canal  approximately 7 mm. Asymmetric uncovertebral arthrosis results in severe bilateral neuroforaminal narrowing at C4-5 and C6-7 and milder narrowing left at C3-4 and C5-6. Upper chest: Unremarkable Other: None IMPRESSION: 1. No acute intracranial abnormality. No calvarial fracture. 2. Markedly comminuted, impacted fracture of the anterior and medial walls of the left maxillary sinus, depressed fracture of the lateral aspect of the left orbital floor by approximately 3 mm, and a minimally displaced fracture of the left medial orbital wall which extends into the left nasal bone which appears minimally displaced. Comminution and impaction of the medial wall of the left maxillary antrum results in marked narrowing of the left nares inferiorly. 3. Remote appearing mildly impacted fracture of the left zygomatic arch. 4. Extensive soft tissue swelling involving the left infraorbital soft tissues and moderate left preseptal soft tissue swelling. 5. No acute fracture or listhesis of the cervical spine. 6. Degenerative disc and degenerative joint disease resulting in moderate central canal stenosis at C3-4 and multilevel neuroforaminal narrowing as described above. Electronically Signed   By: Dorethia Molt M.D.   On: 01/09/2024 03:53     Procedures   Medications Ordered in the ED  oxyCODONE -acetaminophen  (PERCOCET/ROXICET) 5-325 MG per tablet 1 tablet (1 tablet Oral Given 01/09/24 0138)  ketorolac  (TORADOL ) 15 MG/ML injection 15 mg (15 mg Intramuscular Given 01/09/24 0509)                                    Medical Decision Making Amount and/or Complexity of Data Reviewed Radiology: ordered.  Risk Prescription drug management.   This patient presents to the ED for concern of facial injuries, this involves an extensive number of treatment options, and is a complaint that carries with it a high risk of complications and morbidity.  The differential diagnosis includes intracranial abnormality, fracture,  dislocation, soft tissue injury, others   Co morbidities / Chronic conditions that complicate the patient evaluation  HIV   Additional history obtained:  Additional history obtained from EMR   Imaging Studies ordered:  I ordered imaging studies including CT head, maxillofacial, cervical spine I independently visualized and interpreted imaging which showed  1. No acute intracranial abnormality. No calvarial fracture.  2. Markedly comminuted, impacted fracture of the anterior and medial  walls of the left maxillary sinus, depressed fracture of the lateral  aspect of the left orbital floor by approximately 3 mm, and a  minimally displaced fracture of the left medial orbital wall which  extends into the left nasal bone which appears minimally displaced.  Comminution and impaction of the medial wall of the left maxillary  antrum results in marked narrowing of the left nares inferiorly.  3. Remote appearing mildly impacted fracture of the left zygomatic  arch.  4. Extensive soft tissue swelling involving the left infraorbital  soft tissues and moderate left preseptal soft tissue swelling.  5. No acute fracture or listhesis of the cervical spine.  6. Degenerative disc and degenerative joint disease resulting in  moderate central canal stenosis at C3-4 and multilevel  neuroforaminal narrowing as described above.   I agree with the radiologist interpretation   Problem List / ED Course / Critical interventions / Medication management   I ordered medication including Toradol , Percocet Reevaluation of the patient after these medicines showed that the patient improved    Consultations Obtained:  I requested consultation with the on-call otolaryngologist, Dr.Bates,  and  discussed lab and imaging findings as well as pertinent plan - they recommend: no nose blowing, follow up in the office in 5 days   Social Determinants of Health:  Patient has Medicaid, smokes tobacco some  days   Test / Admission - Considered:  Patient with facial fractures. No clinical sign of entrapment. Vision grossly intact. No septal hematoma. Discussed with ENT who recommends outpatient follow up, no antibiotic recommendations. Prescriptions for Norco and naproxen  provided. Return precautions provided.       Final diagnoses:  Closed fracture of left side of maxilla, initial encounter [facility])    ED Discharge Orders          Ordered    naproxen  (NAPROSYN ) 500 MG tablet  2 times daily        01/09/24 0552    HYDROcodone -acetaminophen  (NORCO/VICODIN) 5-325 MG tablet  Every 4 hours PRN        01/09/24 0552               Logan Ubaldo NOVAK, PA-C 01/09/24 0630    Palumbo, April, MD 01/09/24 [phone]

## 2024-01-09 NOTE — Discharge Instructions (Addendum)
 Please follow up with the otolaryngologist in 5 days for further evaluation. Avoid nose blowing.  I have prescribed naprosyn  for inflammation and norco for breakthrough pain. Return to the emergency department if you develop any life threatening symptoms.

## 2024-01-15 ENCOUNTER — Other Ambulatory Visit (HOSPITAL_COMMUNITY): Payer: Self-pay

## 2024-01-15 ENCOUNTER — Other Ambulatory Visit: Payer: Self-pay

## 2024-01-15 ENCOUNTER — Ambulatory Visit: Payer: Self-pay | Admitting: Infectious Disease

## 2024-01-15 ENCOUNTER — Ambulatory Visit: Admitting: Infectious Disease

## 2024-01-15 DIAGNOSIS — Z789 Other specified health status: Secondary | ICD-10-CM

## 2024-01-15 DIAGNOSIS — A529 Late syphilis, unspecified: Secondary | ICD-10-CM

## 2024-01-15 DIAGNOSIS — H35033 Hypertensive retinopathy, bilateral: Secondary | ICD-10-CM

## 2024-01-15 DIAGNOSIS — J3489 Other specified disorders of nose and nasal sinuses: Secondary | ICD-10-CM | POA: Diagnosis not present

## 2024-01-15 DIAGNOSIS — F1010 Alcohol abuse, uncomplicated: Secondary | ICD-10-CM

## 2024-01-15 DIAGNOSIS — I10 Essential (primary) hypertension: Secondary | ICD-10-CM

## 2024-01-15 DIAGNOSIS — S0240DA Maxillary fracture, left side, initial encounter for closed fracture: Secondary | ICD-10-CM | POA: Diagnosis not present

## 2024-01-15 DIAGNOSIS — R85612 Low grade squamous intraepithelial lesion on cytologic smear of anus (LGSIL): Secondary | ICD-10-CM

## 2024-01-15 DIAGNOSIS — B20 Human immunodeficiency virus [HIV] disease: Secondary | ICD-10-CM

## 2024-01-15 MED ORDER — FLUTICASONE PROPIONATE 50 MCG/ACT NA SUSP
2.0000 | Freq: Every day | NASAL | 11 refills | Status: AC
  Filled 2024-01-15: qty 16, 30d supply, fill #0

## 2024-01-31 ENCOUNTER — Other Ambulatory Visit: Payer: Self-pay

## 2024-02-02 ENCOUNTER — Other Ambulatory Visit: Payer: Self-pay

## 2024-02-02 NOTE — Progress Notes (Signed)
 Specialty Pharmacy Refill Coordination Note  Stephen Griffin is a 43 y.o. adult contacted today regarding refills of specialty medication(s) Darun-Cobic-Emtricit-TenofAF (Symtuza ); Dolutegravir  Sodium (Tivicay )   Patient requested Delivery   Delivery date: 02/05/24   Verified address: [address] [name] [address]   Medication will be filled on 02/02/24.

## 2024-02-12 ENCOUNTER — Ambulatory Visit: Admitting: Infectious Disease

## 2024-02-22 ENCOUNTER — Other Ambulatory Visit (HOSPITAL_COMMUNITY): Payer: Self-pay

## 2024-02-29 ENCOUNTER — Other Ambulatory Visit: Payer: Self-pay

## 2024-03-04 ENCOUNTER — Other Ambulatory Visit: Payer: Self-pay
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
## 2024-03-12 ENCOUNTER — Ambulatory Visit: Admitting: Infectious Disease

## 2024-03-12 ENCOUNTER — Ambulatory Visit

## 2024-03-12 DIAGNOSIS — H35033 Hypertensive retinopathy, bilateral: Secondary | ICD-10-CM

## 2024-03-12 DIAGNOSIS — E782 Mixed hyperlipidemia: Secondary | ICD-10-CM

## 2024-03-12 DIAGNOSIS — Z789 Other specified health status: Secondary | ICD-10-CM

## 2024-03-12 DIAGNOSIS — B20 Human immunodeficiency virus [HIV] disease: Secondary | ICD-10-CM

## 2024-03-13 ENCOUNTER — Telehealth: Payer: Self-pay

## 2024-03-13 NOTE — Telephone Encounter (Signed)
 Patient voice mail is full, retuning a missed call requesting an appointment for 10/31 am appointment.

## 2024-03-29 ENCOUNTER — Encounter: Payer: Self-pay | Admitting: Family

## 2024-03-29 ENCOUNTER — Ambulatory Visit (INDEPENDENT_AMBULATORY_CARE_PROVIDER_SITE_OTHER): Admitting: Family

## 2024-03-29 ENCOUNTER — Other Ambulatory Visit: Payer: Self-pay

## 2024-03-29 VITALS — BP 114/75 | HR 92 | Temp 97.7°F | Wt 163.6 lb

## 2024-03-29 DIAGNOSIS — B20 Human immunodeficiency virus [HIV] disease: Secondary | ICD-10-CM

## 2024-03-29 DIAGNOSIS — Z23 Encounter for immunization: Secondary | ICD-10-CM | POA: Diagnosis not present

## 2024-03-29 DIAGNOSIS — A539 Syphilis, unspecified: Secondary | ICD-10-CM

## 2024-03-29 DIAGNOSIS — Z0000 Encounter for general adult medical examination without abnormal findings: Secondary | ICD-10-CM | POA: Insufficient documentation

## 2024-03-29 DIAGNOSIS — Z789 Other specified health status: Secondary | ICD-10-CM
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
## 2024-03-29 NOTE — Patient Instructions (Addendum)
 Nice to see you.  We will check your lab work today.  Continue to take your medication daily as prescribed.  Refills have been sent to the pharmacy.  Plan for follow up with Dr. Daiva Eves in 4 months or sooner if needed with lab work on the same day.  Have a great day and stay safe!

## 2024-03-29 NOTE — Assessment & Plan Note (Signed)
 Previously found to have increased titer of 1: 32 up from 1: 1 signifying new infection and treated with doxycycline .  Check RPR.  Discussed nature of RPR testing and monitoring.

## 2024-03-29 NOTE — Assessment & Plan Note (Signed)
 Stephen Griffin continues to have well-controlled virus with good adherence and tolerance to Symtuza  and Tivicay .  Reviewed previous lab work and discussed plan of care and U equals U.  Covered by Medicaid with no problems obtaining medication from the pharmacy.  Social determinants of health reviewed with no interventions indicated.  Check blood work.  Continue current dose of Symtuza  and Tivicay .  Plan for follow-up in 4 months or sooner if needed with lab work on the same day.

## 2024-03-29 NOTE — Assessment & Plan Note (Signed)
 Currently maintained on estradiol  5 mg q. 14 days and spironolactone .  Last estrogen elevated at 400.  Unclear as to when previous injection was provided.  Check estradiol  and testosterone .  Goal estradiol  less than 200.  Potassium stable and safe to continue with spironolactone  for testosterone  suppression.  Continue current dose of estradiol  and spironolactone .

## 2024-03-29 NOTE — Assessment & Plan Note (Signed)
 Discussed importance of safe sexual practice and condom use. Condoms and site specific STD testing offered.  Vaccinations reviewed and following counseling Prevnar 20 and influenza updated Due for routine dental care. Annual cancer screening completed and in need of high-resolution anoscopy.

## 2024-04-03 LAB — HIV-1 RNA QUANT-NO REFLEX-BLD
HIV 1 RNA Quant: NOT DETECTED {copies}/mL
HIV-1 RNA Quant, Log: NOT DETECTED {Log_copies}/mL

## 2024-04-03 LAB — ESTRADIOL: Estradiol: <30 pg/mL (ref <=39)

## 2024-04-03 LAB — COMPREHENSIVE METABOLIC PANEL WITH GFR
AG Ratio: 1.5 (calc) (ref 1.0–2.5)
ALT: 28 U/L (ref 9–46)
AST: 27 U/L (ref 10–40)
Albumin: 4.4 g/dL (ref 3.6–5.1)
Alkaline phosphatase (APISO): 82 U/L (ref 36–130)
BUN: 14 mg/dL (ref 7–25)
CO2: 22 mmol/L (ref 20–32)
Calcium: 9.9 mg/dL (ref 8.6–10.3)
Chloride: 105 mmol/L (ref 98–110)
Creat: 0.95 mg/dL (ref 0.60–1.29)
Globulin: 3 g/dL (ref 1.9–3.7)
Glucose, Bld: 114 mg/dL — ABNORMAL HIGH (ref 65–99)
Potassium: 3.5 mmol/L (ref 3.5–5.3)
Sodium: 140 mmol/L (ref 135–146)
Total Bilirubin: 0.4 mg/dL (ref 0.2–1.2)
Total Protein: 7.4 g/dL (ref 6.1–8.1)
eGFR: 102 mL/min/1.73m2 (ref >=60)

## 2024-04-03 LAB — TESTOSTERONE: Testosterone: 476 ng/dL (ref 250–827)

## 2024-04-03 LAB — RPR TITER: RPR Titer: 1:4 {titer} — ABNORMAL HIGH

## 2024-04-03 LAB — T PALLIDUM AB: T Pallidum Abs: POSITIVE — AB

## 2024-04-03 LAB — T-HELPER CELLS (CD4) COUNT (NOT AT ARMC)
Absolute CD4: 765 {cells}/uL (ref 490–1740)
CD4 T Helper %: 31 % (ref 30–61)
Total lymphocyte count: 2455 {cells}/uL (ref 850–3900)

## 2024-04-03 LAB — RPR: RPR Ser Ql: REACTIVE — AB

## 2024-04-04 ENCOUNTER — Ambulatory Visit: Payer: Self-pay | Admitting: Family

## 2024-04-09 ENCOUNTER — Other Ambulatory Visit: Payer: Self-pay

## 2024-04-09 NOTE — Progress Notes (Signed)
 Specialty Pharmacy Refill Coordination Note  Stephen Griffin is a 43 y.o. adult contacted today regarding refills of specialty medication(s) Darun-Cobic-Emtricit-TenofAF (Symtuza ); Dolutegravir  Sodium (Tivicay )   Patient requested Delivery   Delivery date: 04/11/24   Verified address: [address] [name] [address]   Medication will be filled on: 04/10/24

## 2024-04-09 NOTE — Progress Notes (Signed)
 Specialty Pharmacy Ongoing Clinical Assessment Note  Stephen Griffin is a 43 y.o. adult who is being followed by the specialty pharmacy service for RxSp HIV   Patient's specialty medication(s) reviewed today: Darun-Cobic-Emtricit-TenofAF (Symtuza ); Dolutegravir  Sodium (Tivicay )   Missed doses in the last 4 weeks: 0   Patient/Caregiver did not have any additional questions or concerns.   Therapeutic benefit summary: Patient is achieving benefit   Adverse events/side effects summary: No adverse events/side effects   Patient's therapy is appropriate to: Continue    Goals Addressed             This Visit's Progress    Achieve Undetectable HIV Viral Load < 20   On track    Patient is on track. Patient will maintain adherence.  Patient's last viral load remained undetectable (03/29/24)         Follow up: 12 months  Keanna Tugwell M Versie Soave Specialty Pharmacist

## 2024-04-30 ENCOUNTER — Ambulatory Visit

## 2024-04-30 ENCOUNTER — Other Ambulatory Visit: Payer: Self-pay

## 2024-05-03 ENCOUNTER — Other Ambulatory Visit: Payer: Self-pay | Admitting: Infectious Disease

## 2024-05-03 ENCOUNTER — Other Ambulatory Visit: Payer: Self-pay

## 2024-05-03 DIAGNOSIS — B20 Human immunodeficiency virus [HIV] disease: Secondary | ICD-10-CM

## 2024-05-06 ENCOUNTER — Other Ambulatory Visit: Payer: Self-pay

## 2024-05-06 MED ORDER — TIVICAY 50 MG PO TABS
50.0000 mg | ORAL_TABLET | Freq: Every day | ORAL | 6 refills | Status: AC
  Filled 2024-05-06 (×2): qty 30, 30d supply, fill #0
  Filled 2024-05-29 – 2024-06-04 (×2): qty 30, 30d supply, fill #1
  Filled 2024-06-27: qty 30, 30d supply, fill #2

## 2024-05-06 NOTE — Telephone Encounter (Signed)
 Patient has Symtuza  refills on file. Sending 6 refills of Tivicay  to match Symtuza .   Zebulan Hinshaw, BSN, RN

## 2024-05-07 ENCOUNTER — Other Ambulatory Visit: Payer: Self-pay | Admitting: Pharmacy Technician

## 2024-05-07 ENCOUNTER — Other Ambulatory Visit: Payer: Self-pay

## 2024-05-07 NOTE — Progress Notes (Signed)
 Specialty Pharmacy Refill Coordination Note  Stephen Griffin is a 43 y.o. adult contacted today regarding refills of specialty medication(s) Darun-Cobic-Emtricit-TenofAF (Symtuza ); Dolutegravir  Sodium (Tivicay )   Patient requested Delivery   Delivery date: 05/09/24   Verified address: [address]  [place]   Medication will be filled on: 05/08/24

## 2024-05-29 ENCOUNTER — Other Ambulatory Visit: Payer: Self-pay

## 2024-05-31 ENCOUNTER — Other Ambulatory Visit: Payer: Self-pay

## 2024-06-04 ENCOUNTER — Other Ambulatory Visit: Payer: Self-pay

## 2024-06-04 ENCOUNTER — Other Ambulatory Visit (HOSPITAL_COMMUNITY): Payer: Self-pay

## 2024-06-04 NOTE — Progress Notes (Signed)
 Specialty Pharmacy Refill Coordination Note  Stephen Griffin is a 44 y.o. adult contacted today regarding refills of specialty medication(s) Darun-Cobic-Emtricit-TenofAF (Symtuza ); Dolutegravir  Sodium (Tivicay )   Patient requested Delivery   Delivery date: 06/05/24   Verified address: [address]  [place]   Medication will be filled on: 06/04/24

## 2024-06-05 ENCOUNTER — Other Ambulatory Visit: Payer: Self-pay

## 2024-06-05 ENCOUNTER — Ambulatory Visit

## 2024-06-27 ENCOUNTER — Other Ambulatory Visit: Payer: Self-pay

## 2024-07-01 ENCOUNTER — Other Ambulatory Visit: Payer: Self-pay

## 2024-07-01 NOTE — Progress Notes (Signed)
 Specialty Pharmacy Refill Coordination Note  Stephen Griffin is a 44 y.o. adult contacted today regarding refills of specialty medication(s) Darun-Cobic-Emtricit-TenofAF (Symtuza ); Dolutegravir  Sodium (Tivicay )   Patient requested Delivery   Delivery date: 07/04/24   Verified address: [address]  [place]   Medication will be filled on: 07/03/24

## 2024-07-03 ENCOUNTER — Other Ambulatory Visit: Payer: Self-pay

## 2024-07-17 ENCOUNTER — Ambulatory Visit: Admitting: Infectious Disease
